# Patient Record
Sex: Male | Born: 1980 | Race: Black or African American | Hispanic: No | Marital: Married | Smoking: Never smoker
Health system: Southern US, Community
[De-identification: ages and names within clinical notes are randomized; demographics above are authoritative.]

## PROBLEM LIST (undated history)

## (undated) DIAGNOSIS — I10 Essential (primary) hypertension: Secondary | ICD-10-CM

## (undated) DIAGNOSIS — E119 Type 2 diabetes mellitus without complications: Secondary | ICD-10-CM

## (undated) DIAGNOSIS — E785 Hyperlipidemia, unspecified: Secondary | ICD-10-CM

## (undated) HISTORY — DX: Essential (primary) hypertension: I10

## (undated) HISTORY — DX: Hyperlipidemia, unspecified: E78.5

## (undated) HISTORY — DX: Type 2 diabetes mellitus without complications: E11.9

---

## 2016-09-29 ENCOUNTER — Emergency Department (HOSPITAL_COMMUNITY)
Admission: EM | Admit: 2016-09-29 | Discharge: 2016-09-30 | Disposition: A | Discharge status: Home / Self Care | Payer: BLUE CROSS/BLUE SHIELD | Attending: Emergency Medicine | Admitting: Emergency Medicine

## 2016-09-29 ENCOUNTER — Encounter (HOSPITAL_COMMUNITY): Payer: Self-pay | Admitting: Emergency Medicine

## 2016-09-29 ENCOUNTER — Emergency Department (HOSPITAL_COMMUNITY): Payer: BLUE CROSS/BLUE SHIELD

## 2016-09-29 DIAGNOSIS — Z79899 Other long term (current) drug therapy: Secondary | ICD-10-CM | POA: Diagnosis not present

## 2016-09-29 DIAGNOSIS — R51 Headache: Secondary | ICD-10-CM | POA: Insufficient documentation

## 2016-09-29 DIAGNOSIS — I10 Essential (primary) hypertension: Secondary | ICD-10-CM | POA: Diagnosis present

## 2016-09-29 LAB — CBC WITH DIFFERENTIAL/PLATELET
BASOS ABS: 0.1 10*3/uL (ref 0.0–0.1)
BASOS PCT: 1 %
EOS ABS: 0.1 10*3/uL (ref 0.0–0.7)
Eosinophils Relative: 2 %
HCT: 45.2 % (ref 39.0–52.0)
HEMOGLOBIN: 15.3 g/dL (ref 13.0–17.0)
Lymphocytes Relative: 33 %
Lymphs Abs: 1.9 10*3/uL (ref 0.7–4.0)
MCH: 27.8 pg (ref 26.0–34.0)
MCHC: 33.8 g/dL (ref 30.0–36.0)
MCV: 82 fL (ref 78.0–100.0)
Monocytes Absolute: 0.7 10*3/uL (ref 0.1–1.0)
Monocytes Relative: 12 %
NEUTROS ABS: 3 10*3/uL (ref 1.7–7.7)
Neutrophils Relative %: 52 %
Platelets: 274 10*3/uL (ref 150–400)
RBC: 5.51 MIL/uL (ref 4.22–5.81)
RDW: 13.5 % (ref 11.5–15.5)
WBC: 5.7 10*3/uL (ref 4.0–10.5)

## 2016-09-29 LAB — BASIC METABOLIC PANEL
ANION GAP: 8 (ref 5–15)
BUN: 13 mg/dL (ref 6–20)
CHLORIDE: 101 mmol/L (ref 101–111)
CO2: 30 mmol/L (ref 22–32)
CREATININE: 1.36 mg/dL — AB (ref 0.61–1.24)
Calcium: 9.5 mg/dL (ref 8.9–10.3)
GFR calc non Af Amer: >60 mL/min (ref >60)
Glucose, Bld: 117 mg/dL — ABNORMAL HIGH (ref 65–99)
POTASSIUM: 3.4 mmol/L — AB (ref 3.5–5.1)
SODIUM: 139 mmol/L (ref 135–145)

## 2016-09-29 MED ORDER — AMLODIPINE BESYLATE 5 MG PO TABS: 5.0000 mg | ORAL_TABLET | Freq: Every day | ORAL | 0 refills | Status: DC

## 2016-09-29 MED ORDER — LABETALOL HCL 5 MG/ML IV SOLN
10.0000 mg | Freq: Once | INTRAVENOUS | Status: AC
  Administered 2016-09-29: 10 mg via INTRAVENOUS
  Filled 2016-09-29: qty 4

## 2016-09-29 MED ORDER — AMLODIPINE BESYLATE 5 MG PO TABS
5.0000 mg | ORAL_TABLET | Freq: Once | ORAL | Status: AC
  Administered 2016-09-29: 5 mg via ORAL
  Filled 2016-09-29: qty 1

## 2016-09-29 NOTE — ED Provider Notes (Signed)
[name]   CSN: [id number] Arrival date & time: 09/29/16  [place]     History   Chief Complaint Chief Complaint  Patient presents with  . Hypertension    HPI Paul Rivers is a 36 y.o. male.  HPI   Patient is a 36 year old male with no known past medical history presents the ED with complaint of elevated blood pressure. Patient reports while he was at his physical for his job he was noted to have blood pressure of 198/102. Patient reports when they rechecked his blood pressure was 204/100. Denies history of blood pressure. Denies taking any medications at home. Patient reports yesterday he had a constant throbbing headache to the back which he states is still constant today but feeling improved. Denies taking any medications for his headache at home. Denies fever, lightheadedness, dizziness, visual changes, cough, shortness of breath, chest pain, palpitations, abdominal pain, nausea, vomiting, diarrhea, urinary symptoms, numbness, tingling, weakness, leg swelling.  History reviewed. No pertinent past medical history.  There are no active problems to display for this patient.   History reviewed. No pertinent surgical history.     Home Medications    Prior to Admission medications   Medication Sig Start Date End Date Taking? Authorizing Provider  amLODipine (NORVASC) 5 MG tablet Take 1 tablet (5 mg total) by mouth daily. 09/29/16   Nona Dell, PA-C    Family History No family history on file.  Social History Social History  Substance Use Topics  . Smoking status: Never Smoker  . Smokeless tobacco: Not on file  . Alcohol use Yes     Allergies   Patient has no known allergies.   Review of Systems Review of Systems  Neurological: Positive for headaches.  All other systems reviewed and are negative.    Physical Exam Updated Vital Signs BP (!) 187/115   Pulse 83   Temp 99 F (37.2 C) (Oral)   Resp 17   Wt 106.1 kg   SpO2  98%   Physical Exam  Constitutional: He is oriented to person, place, and time. He appears well-developed and well-nourished. No distress.  HENT:  Head: Normocephalic and atraumatic.  Mouth/Throat: Oropharynx is clear and moist. No oropharyngeal exudate.  Eyes: Conjunctivae and EOM are normal. Pupils are equal, round, and reactive to light. Right eye exhibits no discharge. Left eye exhibits no discharge. No scleral icterus.  Neck: Normal range of motion. Neck supple.  Cardiovascular: Normal rate, regular rhythm, normal heart sounds and intact distal pulses.   HR 80  Pulmonary/Chest: Effort normal and breath sounds normal. No respiratory distress. He has no wheezes. He has no rales. He exhibits no tenderness.  Abdominal: Soft. Bowel sounds are normal. He exhibits no distension and no mass. There is no tenderness. There is no rebound and no guarding. No hernia.  Musculoskeletal: Normal range of motion. He exhibits no edema.  Neurological: He is alert and oriented to person, place, and time. He has normal strength. No cranial nerve deficit or sensory deficit. Coordination and gait normal.  Skin: Skin is warm and dry. He is not diaphoretic.  Nursing note and vitals reviewed.    ED Treatments / Results  Labs (all labs ordered are listed, but only abnormal results are displayed) Labs Reviewed  BASIC METABOLIC PANEL - Abnormal; Notable for the following:       Result Value   Potassium 3.4 (*)    Glucose, Bld 117 (*)    Creatinine, Ser 1.36 (*)  All other components within normal limits  CBC WITH DIFFERENTIAL/PLATELET    EKG  EKG Interpretation  Date/Time:  Monday September 29 2016 20:37:13 EST Ventricular Rate:  101 PR Interval:    QRS Duration: 87 QT Interval:  346 QTC Calculation: 449 R Axis:   16 Text Interpretation:  Sinus tachycardia Consider right atrial enlargement No prior for comparison No STEMI Confirmed by TEGELER MD, CHRISTOPHER [phone]) on 09/29/2016 10:51:03 PM        Radiology Ct Head Wo Contrast  Result Date: 09/29/2016 CLINICAL DATA:  Elevated blood pressure. Headaches, but not currently. EXAM: CT HEAD WITHOUT CONTRAST TECHNIQUE: Contiguous axial images were obtained from the base of the skull through the vertex without intravenous contrast. COMPARISON:  None. FINDINGS: Brain: No evidence of acute infarction, hemorrhage, hydrocephalus, extra-axial collection or mass lesion/mass effect. Vascular: No hyperdense vessel or unexpected calcification. Skull: No osseous abnormality. Sinuses/Orbits: Visualized paranasal sinuses are clear. Visualized mastoid sinuses are clear. Visualized orbits demonstrate no focal abnormality. Other: None IMPRESSION: No acute intracranial pathology. Electronically Signed   By: Kathreen Devoid   On: 09/29/2016 21:39    Procedures Procedures (including critical care time)  Medications Ordered in ED Medications  amLODipine (NORVASC) tablet 5 mg (not administered)  labetalol (NORMODYNE,TRANDATE) injection 10 mg (10 mg Intravenous Given 09/29/16 2128)     Initial Impression / Assessment and Plan / ED Course  I have reviewed the triage vital signs and the nursing notes.  Pertinent labs & imaging results that were available during my care of the patient were reviewed by me and considered in my medical decision making (see chart for details).     Patient reports with reported elevated blood pressure which was noted at his work physical earlier this afternoon. Reports blood pressure 198/102. Initial BP in the ED 244/138. Patient reports only having a mild throbbing headache, denies any other symptoms. Exam unremarkable. No neuro deficits. Patient given IV labetalol in the ED. EKG showed sinus tachycardia with no acute ischemic changes. Labs revealed creatinine 1.36, no prior results to compare. Remaining labs unremarkable. CT head negative. On reevaluation patient's blood pressure has improved to 187/115. Patient reports significant  improvement of his headache. Plan to give pt oral dose of norvasc in the ED and reevaluate BP.  Hand-off to [facility], PA-C. Plan to reevaluate pt s/p PO norvasc with plan of d/c pt home with rx of 5mg  Norvasc QD and close PCP follow up.  Final Clinical Impressions(s) / ED Diagnoses   Final diagnoses:  Hypertension, unspecified type    New Prescriptions New Prescriptions   AMLODIPINE (NORVASC) 5 MG TABLET    Take 1 tablet (5 mg total) by mouth daily.     Chesley Noon [place], [place] 09/29/16 [place], MD 09/30/16 [phone]

## 2016-09-29 NOTE — ED Provider Notes (Signed)
10:57 PM Patient signed out to me at change of shift.  Pt with elevated blood pressure, given labetolol IV and now to be given PO Norvasc.  Blood pressure is improving.  Labs reveal elevated creatinine.  Plan is for d/c home with PCP follow up on improvement of blood pressure.    1:17 AM Pt feeling well, would like to go home.  BP improved.     Clayton Bibles, PA-C 09/30/16 0118    Gwenyth Allegra Tegeler, MD 09/30/16 [phone]

## 2016-09-29 NOTE — Discharge Instructions (Signed)
Take your blood pressure medication as prescribed. I recommend scheduling appointment with a primary care provider's office listed below within the next week for follow-up evaluation regarding your elevated blood pressure. I also recommend having your blood rechecked as your creatinine was elevated in the ED tonight (Cr 1.36).  Please return to the Emergency Department if symptoms worsen or new onset of fever, headache, lightheadedness, dizziness, visual changes, chest pain, or palpitations, nausea, vomiting, urinary symptoms, numbness, tingling, weakness.

## 2016-09-29 NOTE — ED Notes (Signed)
[facility] charge RN verbalizes need for oral hypertensive medication and to treat blood pressure while waiting. Per Tegeler pt needs next available room with noncontributory medical hx and current blood pressure readings.

## 2016-09-29 NOTE — ED Triage Notes (Signed)
Pt referred here post physical related to BP 198/102; pt verbalizes no hx; only family hx of hypertension; denies symptoms.

## 2016-10-22 ENCOUNTER — Other Ambulatory Visit (INDEPENDENT_AMBULATORY_CARE_PROVIDER_SITE_OTHER): Payer: BLUE CROSS/BLUE SHIELD

## 2016-10-22 ENCOUNTER — Other Ambulatory Visit: Payer: Self-pay | Admitting: Family

## 2016-10-22 ENCOUNTER — Ambulatory Visit (INDEPENDENT_AMBULATORY_CARE_PROVIDER_SITE_OTHER): Payer: BLUE CROSS/BLUE SHIELD | Admitting: Family

## 2016-10-22 ENCOUNTER — Encounter: Payer: Self-pay | Admitting: Family

## 2016-10-22 VITALS — BP 170/100 | HR 80 | Temp 98.2°F | Resp 16 | Ht 65.0 in | Wt 240.0 lb

## 2016-10-22 DIAGNOSIS — I10 Essential (primary) hypertension: Secondary | ICD-10-CM | POA: Insufficient documentation

## 2016-10-22 DIAGNOSIS — E6609 Other obesity due to excess calories: Secondary | ICD-10-CM | POA: Diagnosis not present

## 2016-10-22 DIAGNOSIS — E669 Obesity, unspecified: Secondary | ICD-10-CM | POA: Insufficient documentation

## 2016-10-22 DIAGNOSIS — Z6839 Body mass index (BMI) 39.0-39.9, adult: Secondary | ICD-10-CM

## 2016-10-22 DIAGNOSIS — E1169 Type 2 diabetes mellitus with other specified complication: Secondary | ICD-10-CM | POA: Insufficient documentation

## 2016-10-22 DIAGNOSIS — E119 Type 2 diabetes mellitus without complications: Secondary | ICD-10-CM

## 2016-10-22 DIAGNOSIS — IMO0001 Reserved for inherently not codable concepts without codable children: Secondary | ICD-10-CM | POA: Insufficient documentation

## 2016-10-22 LAB — COMPREHENSIVE METABOLIC PANEL
ALBUMIN: 4.8 g/dL (ref 3.5–5.2)
ALT: 37 U/L (ref 0–53)
AST: 28 U/L (ref 0–37)
Alkaline Phosphatase: 62 U/L (ref 39–117)
BILIRUBIN TOTAL: 0.4 mg/dL (ref 0.2–1.2)
BUN: 17 mg/dL (ref 6–23)
CALCIUM: 9.4 mg/dL (ref 8.4–10.5)
CHLORIDE: 104 meq/L (ref 96–112)
CO2: 27 mEq/L (ref 19–32)
CREATININE: 1.2 mg/dL (ref 0.40–1.50)
GFR: 73.47 mL/min (ref >60.00)
Glucose, Bld: 108 mg/dL — ABNORMAL HIGH (ref 70–99)
Potassium: 3.7 mEq/L (ref 3.5–5.1)
SODIUM: 141 meq/L (ref 135–145)
TOTAL PROTEIN: 7.9 g/dL (ref 6.0–8.3)

## 2016-10-22 LAB — LIPID PANEL
CHOLESTEROL: 161 mg/dL (ref 0–200)
HDL: 37.3 mg/dL — ABNORMAL LOW (ref >39.00)
LDL Cholesterol: 110 mg/dL — ABNORMAL HIGH (ref 0–99)
NONHDL: 123.53
Total CHOL/HDL Ratio: 4
Triglycerides: 68 mg/dL (ref 0.0–149.0)
VLDL: 13.6 mg/dL (ref 0.0–40.0)

## 2016-10-22 LAB — HEMOGLOBIN A1C: HEMOGLOBIN A1C: 6.9 % — AB (ref 4.6–6.5)

## 2016-10-22 MED ORDER — METFORMIN HCL ER 500 MG PO TB24: 500.0000 mg | ORAL_TABLET | Freq: Every day | ORAL | 0 refills | Status: DC

## 2016-10-22 MED ORDER — LISINOPRIL-HYDROCHLOROTHIAZIDE 20-12.5 MG PO TABS: 1.0000 | ORAL_TABLET | Freq: Every day | ORAL | 1 refills | Status: DC

## 2016-10-22 NOTE — Progress Notes (Addendum)
Subjective:    Patient ID: Paul Rivers, male    DOB: 11-Nov-1980, 36 y.o.   MRN: [id number]  Chief Complaint  Patient presents with  . Establish Care    wants to discuss blood pressure, states bp medication makes him feel funny in the morning    HPI:  Paul Rivers is a 36 y.o. male who  has a past medical history of Hypertension. and presents today for an office visit to establish care.    Hypertension - Recently evaulated in the emergency department with the chief complaint of elevated blood pressure. He was noted to have blood pressure readings of 198/102 and repeat of 204/100. Described a constant throbbing headache to the the back of his head. EKG showed sinus tachycardia with right atrial enlargement.  CT scan of the head was normal. He was discharged with 5 mg of amlodipine. z  Reports taking the amlodipine as prescribed and has weird feelings in the morning. Describes it as feeling spaced out at times. Not currently checking his blood pressure at home. Denies worst headache of life with no symptoms of end organ damage. Working on following a low sodium diet and has increased fruit and vegetable intake. Physical activity gradually increasing.   BP Readings from Last 3 Encounters:  10/22/16 (!) 170/100  09/30/16 (!) 162/102    No Known Allergies    Outpatient Medications Prior to Visit  Medication Sig Dispense Refill  . amLODipine (NORVASC) 5 MG tablet Take 1 tablet (5 mg total) by mouth daily. 30 tablet 0   No facility-administered medications prior to visit.      Past Medical History:  Diagnosis Date  . Hypertension       History reviewed. No pertinent surgical history.    Family History  Problem Relation Age of Onset  . Diabetes Mother   . Diabetes Father   . Hypertension Father   . Hypertension Maternal Grandmother       Social History   Social History  . Marital status: Married    Spouse name: N/A  . Number of children: 3  . Years of  education: 12   Occupational History  . Security    Social History Main Topics  . Smoking status: Never Smoker  . Smokeless tobacco: Never Used  . Alcohol use Yes     Comment: Rarely  . Drug use: No  . Sexual activity: Not on file   Other Topics Concern  . Not on file   Social History Narrative   Fun/hobby: Keeping up with children (6, 4, 14)      Review of Systems  Constitutional: Negative for chills and fever.  Eyes:       Negative for changes in vision  Respiratory: Negative for cough, chest tightness and wheezing.   Cardiovascular: Negative for chest pain, palpitations and leg swelling.  Neurological: Negative for dizziness, weakness and light-headedness.       Objective:    BP (!) 170/100   Pulse 80   Temp 98.2 F (36.8 C) (Oral)   Resp 16   Ht '5\' 5"'  (1.651 m)   Wt 240 lb (108.9 kg)   SpO2 97%   BMI 39.94 kg/m  Nursing note and vital signs reviewed.  Physical Exam  Constitutional: He is oriented to person, place, and time. He appears well-developed and well-nourished. No distress.  Neck:  Acanthoses nigrans present.   Cardiovascular: Normal rate, regular rhythm, normal heart sounds and intact distal pulses.  Exam reveals  no gallop and no friction rub.   No murmur heard. Pulmonary/Chest: Effort normal and breath sounds normal. No respiratory distress. He has no wheezes. He has no rales. He exhibits no tenderness.  Neurological: He is alert and oriented to person, place, and time.  Skin: Skin is warm and dry.  Psychiatric: He has a normal mood and affect. His behavior is normal. Judgment and thought content normal.        Assessment & Plan:   Problem List Items Addressed This Visit      Cardiovascular and Mediastinum   Hypertension    Hypertension remains above goal 140/90 with current medication regimen and mild side effect of dizziness which resolves over the course of the day. Try taking medication in the evening. Start  lisinopril-hydrochlorothiazide. Denies worse headache of life with no new symptoms of end organ damage noted on physical exam. Encouraged to follow low sodium diet with information on the DASH eating plan provided. Monitor blood pressure at home. Follow-up in 2 weeks or sooner if needed      Relevant Medications   lisinopril-hydrochlorothiazide (ZESTORETIC) 20-12.5 MG tablet   Other Relevant Orders   Comp Met (CMET)   Hemoglobin A1c   Lipid Profile     Other   Class 2 obesity due to excess calories with serious comorbidity and body mass index (BMI) of 39.0 to 39.9 in adult - Primary    BMI of 39 indicating obesity. Recommend weight loss of 5-10% of current body weight. Recommend increasing physical activity to 30 minutes of moderate level activity daily. Encourage nutritional intake that focuses on nutrient dense foods and is moderate, varied, and balanced and is low in saturated fats and processed/sugary foods. Continue to monitor.          I am having [name] start on lisinopril-hydrochlorothiazide. I am also having him maintain his amLODipine.   Meds ordered this encounter  Medications  . lisinopril-hydrochlorothiazide (ZESTORETIC) 20-12.5 MG tablet    Sig: Take 1 tablet by mouth daily.    Dispense:  30 tablet    Refill:  1    Order Specific Question:   Supervising Provider    Answer:   Pricilla Holm A [[id number]]   A total of 30 minutes were spent face-to-face with the patient during this encounter and over half of that time was spent on counseling and coordination of care.  We discussed in depth the importance of lowering blood pressure to reduce risk of end organ damage in the future. We discussed pharmacological and non-pharmacological approaches including the DASH eating plan. All patient questions were answered.    Follow-up: Return in about 2 weeks (around 11/05/2016).   Mauricio Po, FNP

## 2016-10-22 NOTE — Assessment & Plan Note (Signed)
Hypertension remains above goal 140/90 with current medication regimen and mild side effect of dizziness which resolves over the course of the day. Try taking medication in the evening. Start lisinopril-hydrochlorothiazide. Denies worse headache of life with no new symptoms of end organ damage noted on physical exam. Encouraged to follow low sodium diet with information on the DASH eating plan provided. Monitor blood pressure at home. Follow-up in 2 weeks or sooner if needed

## 2016-10-22 NOTE — Assessment & Plan Note (Signed)
BMI of 39 indicating obesity. Recommend weight loss of 5-10% of current body weight. Recommend increasing physical activity to 30 minutes of moderate level activity daily. Encourage nutritional intake that focuses on nutrient dense foods and is moderate, varied, and balanced and is low in saturated fats and processed/sugary foods. Continue to monitor.

## 2016-10-22 NOTE — Patient Instructions (Addendum)
Thank you for choosing [facility].  SUMMARY AND INSTRUCTIONS:  Please monitor your blood pressure at home and record the numbers to bring at your next office visit.  Follow a low sodium diet.   Increase physical activities gradually.  Medication:  Please continue to take the amlodipine.  Start taking the linsinopril-hydrochlorothiazide.   Your prescription(s) have been submitted to your pharmacy or been printed and provided for you. Please take as directed and contact our office if you believe you are having problem(s) with the medication(s) or have any questions.  Labs:  Please stop by the lab on the lower level of the building for your blood work. Your results will be released to [place] (or called to you) after review, usually within 72 hours after test completion. If any changes need to be made, you will be notified at that same time.  1.) The lab is open from 7:30am to 5:30 pm Monday-Friday 2.) No appointment is necessary 3.) Fasting (if needed) is 6-8 hours after food and drink; black coffee and water are okay   Follow up:  If your symptoms worsen or fail to improve, please contact our office for further instruction, or in case of emergency go directly to the emergency room at the closest medical facility.     Hypertension Hypertension, commonly called high blood pressure, is when the force of blood pumping through the arteries is too strong. The arteries are the blood vessels that carry blood from the heart throughout the body. Hypertension forces the heart to work harder to pump blood and may cause arteries to become narrow or stiff. Having untreated or uncontrolled hypertension can cause heart attacks, strokes, kidney disease, and other problems. A blood pressure reading consists of a higher number over a lower number. Ideally, your blood pressure should be below 120/80. The first ("top") number is called the systolic pressure. It is a measure of the pressure in  your arteries as your heart beats. The second ("bottom") number is called the diastolic pressure. It is a measure of the pressure in your arteries as the heart relaxes. What are the causes? The cause of this condition is not known. What increases the risk? Some risk factors for high blood pressure are under your control. Others are not. Factors you can change   Smoking.  Having type 2 diabetes mellitus, high cholesterol, or both.  Not getting enough exercise or physical activity.  Being overweight.  Having too much fat, sugar, calories, or salt (sodium) in your diet.  Drinking too much alcohol. Factors that are difficult or impossible to change   Having chronic kidney disease.  Having a family history of high blood pressure.  Age. Risk increases with age.  Race. You may be at higher risk if you are African-American.  Gender. Men are at higher risk than women before age 12. After age 47, women are at higher risk than men.  Having obstructive sleep apnea.  Stress. What are the signs or symptoms? Extremely high blood pressure (hypertensive crisis) may cause:  Headache.  Anxiety.  Shortness of breath.  Nosebleed.  Nausea and vomiting.  Severe chest pain.  Jerky movements you cannot control (seizures). How is this diagnosed? This condition is diagnosed by measuring your blood pressure while you are seated, with your arm resting on a surface. The cuff of the blood pressure monitor will be placed directly against the skin of your upper arm at the level of your heart. It should be measured at least twice using  the same arm. Certain conditions can cause a difference in blood pressure between your right and left arms. Certain factors can cause blood pressure readings to be lower or higher than normal (elevated) for a short period of time:  When your blood pressure is higher when you are in a health care provider's office than when you are at home, this is called white coat  hypertension. Most people with this condition do not need medicines.  When your blood pressure is higher at home than when you are in a health care provider's office, this is called masked hypertension. Most people with this condition may need medicines to control blood pressure. If you have a high blood pressure reading during one visit or you have normal blood pressure with other risk factors:  You may be asked to return on a different day to have your blood pressure checked again.  You may be asked to monitor your blood pressure at home for 1 week or longer. If you are diagnosed with hypertension, you may have other blood or imaging tests to help your health care provider understand your overall risk for other conditions. How is this treated? This condition is treated by making healthy lifestyle changes, such as eating healthy foods, exercising more, and reducing your alcohol intake. Your health care provider may prescribe medicine if lifestyle changes are not enough to get your blood pressure under control, and if:  Your systolic blood pressure is above 130.  Your diastolic blood pressure is above 80. Your personal target blood pressure may vary depending on your medical conditions, your age, and other factors. Follow these instructions at home: Eating and drinking   Eat a diet that is high in fiber and potassium, and low in sodium, added sugar, and fat. An example eating plan is called the DASH (Dietary Approaches to Stop Hypertension) diet. To eat this way:  Eat plenty of fresh fruits and vegetables. Try to fill half of your plate at each meal with fruits and vegetables.  Eat whole grains, such as whole wheat pasta, brown rice, or whole grain bread. Fill about one quarter of your plate with whole grains.  Eat or drink low-fat dairy products, such as skim milk or low-fat yogurt.  Avoid fatty cuts of meat, processed or cured meats, and poultry with skin. Fill about one quarter of your  plate with lean proteins, such as fish, chicken without skin, beans, eggs, and tofu.  Avoid premade and processed foods. These tend to be higher in sodium, added sugar, and fat.  Reduce your daily sodium intake. Most people with hypertension should eat less than 1,500 mg of sodium a day.  Limit alcohol intake to no more than 1 drink a day for nonpregnant women and 2 drinks a day for men. One drink equals 12 oz of beer, 5 oz of wine, or 1 oz of hard liquor. Lifestyle   Work with your health care provider to maintain a healthy body weight or to lose weight. Ask what an ideal weight is for you.  Get at least 30 minutes of exercise that causes your heart to beat faster (aerobic exercise) most days of the week. Activities may include walking, swimming, or biking.  Include exercise to strengthen your muscles (resistance exercise), such as pilates or lifting weights, as part of your weekly exercise routine. Try to do these types of exercises for 30 minutes at least 3 days a week.  Do not use any products that contain nicotine or tobacco, such  as cigarettes and e-cigarettes. If you need help quitting, ask your health care provider.  Monitor your blood pressure at home as told by your health care provider.  Keep all follow-up visits as told by your health care provider. This is important. Medicines   Take over-the-counter and prescription medicines only as told by your health care provider. Follow directions carefully. Blood pressure medicines must be taken as prescribed.  Do not skip doses of blood pressure medicine. Doing this puts you at risk for problems and can make the medicine less effective.  Ask your health care provider about side effects or reactions to medicines that you should watch for. Contact a health care provider if:  You think you are having a reaction to a medicine you are taking.  You have headaches that keep coming back (recurring).  You feel dizzy.  You have swelling  in your ankles.  You have trouble with your vision. Get help right away if:  You develop a severe headache or confusion.  You have unusual weakness or numbness.  You feel faint.  You have severe pain in your chest or abdomen.  You vomit repeatedly.  You have trouble breathing. Summary  Hypertension is when the force of blood pumping through your arteries is too strong. If this condition is not controlled, it may put you at risk for serious complications.  Your personal target blood pressure may vary depending on your medical conditions, your age, and other factors. For most people, a normal blood pressure is less than 120/80.  Hypertension is treated with lifestyle changes, medicines, or a combination of both. Lifestyle changes include weight loss, eating a healthy, low-sodium diet, exercising more, and limiting alcohol. This information is not intended to replace advice given to you by your health care provider. Make sure you discuss any questions you have with your health care provider. Document Released: 08/11/2005 Document Revised: 07/09/2016 Document Reviewed: 07/09/2016 Elsevier Interactive Patient Education  2017 [place] DASH stands for "Dietary Approaches to Stop Hypertension." The DASH eating plan is a healthy eating plan that has been shown to reduce high blood pressure (hypertension). It may also reduce your risk for type 2 diabetes, heart disease, and stroke. The DASH eating plan may also help with weight loss. What are tips for following this plan? General guidelines   Avoid eating more than 2,300 mg (milligrams) of salt (sodium) a day. If you have hypertension, you may need to reduce your sodium intake to 1,500 mg a day.  Limit alcohol intake to no more than 1 drink a day for nonpregnant women and 2 drinks a day for men. One drink equals 12 oz of beer, 5 oz of wine, or 1 oz of hard liquor.  Work with your health care provider to maintain a  healthy body weight or to lose weight. Ask what an ideal weight is for you.  Get at least 30 minutes of exercise that causes your heart to beat faster (aerobic exercise) most days of the week. Activities may include walking, swimming, or biking.  Work with your health care provider or diet and nutrition specialist (dietitian) to adjust your eating plan to your individual calorie needs. Reading food labels   Check food labels for the amount of sodium per serving. Choose foods with less than 5 percent of the Daily Value of sodium. Generally, foods with less than 300 mg of sodium per serving fit into this eating plan.  To find whole grains, look  for the word "whole" as the first word in the ingredient list. Shopping   Buy products labeled as "low-sodium" or "no salt added."  Buy fresh foods. Avoid canned foods and premade or frozen meals. Cooking   Avoid adding salt when cooking. Use salt-free seasonings or herbs instead of table salt or sea salt. Check with your health care provider or pharmacist before using salt substitutes.  Do not fry foods. Cook foods using healthy methods such as baking, boiling, grilling, and broiling instead.  Cook with heart-healthy oils, such as olive, canola, soybean, or sunflower oil. Meal planning    Eat a balanced diet that includes:  5 or more servings of fruits and vegetables each day. At each meal, try to fill half of your plate with fruits and vegetables.  Up to 6-8 servings of whole grains each day.  Less than 6 oz of lean meat, poultry, or fish each day. A 3-oz serving of meat is about the same size as a deck of cards. One egg equals 1 oz.  2 servings of low-fat dairy each day.  A serving of nuts, seeds, or beans 5 times each week.  Heart-healthy fats. Healthy fats called Omega-3 fatty acids are found in foods such as flaxseeds and coldwater fish, like sardines, salmon, and mackerel.  Limit how much you eat of the following:  Canned or  prepackaged foods.  Food that is high in trans fat, such as fried foods.  Food that is high in saturated fat, such as fatty meat.  Sweets, desserts, sugary drinks, and other foods with added sugar.  Full-fat dairy products.  Do not salt foods before eating.  Try to eat at least 2 vegetarian meals each week.  Eat more home-cooked food and less restaurant, buffet, and fast food.  When eating at a restaurant, ask that your food be prepared with less salt or no salt, if possible. What foods are recommended? The items listed may not be a complete list. Talk with your dietitian about what dietary choices are best for you. Grains  Whole-grain or whole-wheat bread. Whole-grain or whole-wheat pasta. Brown rice. [place]. Bulgur. Whole-grain and low-sodium cereals. Pita bread. Low-fat, low-sodium crackers. Whole-wheat flour tortillas. Vegetables  Fresh or frozen vegetables (raw, steamed, roasted, or grilled). Low-sodium or reduced-sodium tomato and vegetable juice. Low-sodium or reduced-sodium tomato sauce and tomato paste. Low-sodium or reduced-sodium canned vegetables. Fruits  All fresh, dried, or frozen fruit. Canned fruit in natural juice (without added sugar). Meat and other protein foods  Skinless chicken or [place]. Ground chicken or [place]. Pork with fat trimmed off. Fish and seafood. Egg whites. Dried beans, peas, or lentils. Unsalted nuts, nut butters, and seeds. Unsalted canned beans. Lean cuts of beef with fat trimmed off. Low-sodium, lean deli meat. Dairy  Low-fat (1%) or fat-free (skim) milk. Fat-free, low-fat, or reduced-fat cheeses. Nonfat, low-sodium ricotta or cottage cheese. Low-fat or nonfat yogurt. Low-fat, low-sodium cheese. Fats and oils  Soft margarine without trans fats. Vegetable oil. Low-fat, reduced-fat, or light mayonnaise and salad dressings (reduced-sodium). Canola, safflower, olive, soybean, and sunflower oils. Avocado. Seasoning and other foods  Herbs.  Spices. Seasoning mixes without salt. Unsalted popcorn and pretzels. Fat-free sweets. What foods are not recommended? The items listed may not be a complete list. Talk with your dietitian about what dietary choices are best for you. Grains  Baked goods made with fat, such as croissants, muffins, or some breads. Dry pasta or rice meal packs. Vegetables  Creamed or fried vegetables. Vegetables  in a cheese sauce. Regular canned vegetables (not low-sodium or reduced-sodium). Regular canned tomato sauce and paste (not low-sodium or reduced-sodium). Regular tomato and vegetable juice (not low-sodium or reduced-sodium). Angie Fava. Olives. Fruits  Canned fruit in a light or heavy syrup. Fried fruit. Fruit in cream or butter sauce. Meat and other protein foods  Fatty cuts of meat. Ribs. Fried meat. Berniece Salines. Sausage. Bologna and other processed lunch meats. Salami. Fatback. Hotdogs. Bratwurst. Salted nuts and seeds. Canned beans with added salt. Canned or smoked fish. Whole eggs or egg yolks. Chicken or [place] with skin. Dairy  Whole or 2% milk, cream, and half-and-half. Whole or full-fat cream cheese. Whole-fat or sweetened yogurt. Full-fat cheese. Nondairy creamers. Whipped toppings. Processed cheese and cheese spreads. Fats and oils  Butter. Stick margarine. Lard. Shortening. Ghee. Bacon fat. Tropical oils, such as coconut, palm kernel, or palm oil. Seasoning and other foods  Salted popcorn and pretzels. Onion salt, garlic salt, seasoned salt, table salt, and sea salt. Worcestershire sauce. Tartar sauce. Barbecue sauce. Teriyaki sauce. Soy sauce, including reduced-sodium. Steak sauce. Canned and packaged gravies. Fish sauce. Oyster sauce. Cocktail sauce. Horseradish that you find on the shelf. Ketchup. Mustard. Meat flavorings and tenderizers. Bouillon cubes. Hot sauce and Tabasco sauce. Premade or packaged marinades. Premade or packaged taco seasonings. Relishes. Regular salad dressings. Where to find more  information:  National Heart, Lung, and [place]: https://wilson-eaton.com/  American Heart Association: www.heart.org Summary  The DASH eating plan is a healthy eating plan that has been shown to reduce high blood pressure (hypertension). It may also reduce your risk for type 2 diabetes, heart disease, and stroke.  With the DASH eating plan, you should limit salt (sodium) intake to 2,300 mg a day. If you have hypertension, you may need to reduce your sodium intake to 1,500 mg a day.  When on the DASH eating plan, aim to eat more fresh fruits and vegetables, whole grains, lean proteins, low-fat dairy, and heart-healthy fats.  Work with your health care provider or diet and nutrition specialist (dietitian) to adjust your eating plan to your individual calorie needs. This information is not intended to replace advice given to you by your health care provider. Make sure you discuss any questions you have with your health care provider. Document Released: 07/31/2011 Document Revised: 08/04/2016 Document Reviewed: 08/04/2016 Elsevier Interactive Patient Education  2017 [employer].

## 2016-11-06 ENCOUNTER — Ambulatory Visit: Payer: BLUE CROSS/BLUE SHIELD | Admitting: Family

## 2016-11-18 ENCOUNTER — Ambulatory Visit: Payer: BLUE CROSS/BLUE SHIELD | Admitting: Family

## 2019-02-22 ENCOUNTER — Ambulatory Visit (INDEPENDENT_AMBULATORY_CARE_PROVIDER_SITE_OTHER): Payer: BLUE CROSS/BLUE SHIELD | Admitting: Family Medicine

## 2019-02-22 ENCOUNTER — Other Ambulatory Visit: Payer: Self-pay

## 2019-02-22 ENCOUNTER — Encounter: Payer: Self-pay | Admitting: Family Medicine

## 2019-02-22 DIAGNOSIS — E1169 Type 2 diabetes mellitus with other specified complication: Secondary | ICD-10-CM | POA: Insufficient documentation

## 2019-02-22 DIAGNOSIS — I10 Essential (primary) hypertension: Secondary | ICD-10-CM

## 2019-02-22 DIAGNOSIS — E785 Hyperlipidemia, unspecified: Secondary | ICD-10-CM

## 2019-02-22 DIAGNOSIS — G4730 Sleep apnea, unspecified: Secondary | ICD-10-CM

## 2019-02-22 MED ORDER — BLOOD PRESSURE MONITOR AUTOMAT DEVI: 1.0000 | Freq: Every day | 0 refills | Status: DC

## 2019-02-22 MED ORDER — ACCU-CHEK AVIVA CONNECT W/DEVICE KIT: 1.0000 | PACK | Freq: Every day | 0 refills | Status: DC

## 2019-02-22 MED ORDER — AMLODIPINE BESYLATE 5 MG PO TABS: 5.0000 mg | ORAL_TABLET | Freq: Every day | ORAL | 1 refills | Status: DC

## 2019-02-22 NOTE — Assessment & Plan Note (Signed)
Educated about Dx,prognosis,treatment options,and complications of elevated glucose.  Regular exercise and healthy diet with avoidance of added sugar food intake is an important part of treatment and recommended. Annual eye exam, periodic dental and foot care recommended. Lab appt will be arranged.  F/U in 4 months

## 2019-02-22 NOTE — Progress Notes (Signed)
Virtual Visit via Video Note   I connected with Paul Rivers on 02/22/19 at  4:00 PM EDT by a video enabled telemedicine application and verified that I am speaking with the correct person using two identifiers.  Location patient: home Location provider:home office Persons participating in the virtual visit: patient, provider  I discussed the limitations of evaluation and management by telemedicine and the availability of in person appointments. The patient expressed understanding and agreed to proceed.   HPI:  Paul Rivers is a 38 yo AA male who is establishing care today. Former PCP: Mauricio Po, PA Last CPE > 2 years ago.  Hx of HTN,DM II,and HLD among some. Last follow up visit 09/2016,when he was diagnosed with DM II. Metformin was recommended,he did not take medication. He is not checking BS's. Denies abdominal pain, nausea,vomiting, polydipsia,polyuria, or polyphagia.  He has not had an eye exam in a few years.  Denies feet ulcers, numbness,tingling,or burning.   Lab Results  Component Value Date   HGBA1C 6.9 (H) 10/22/2016   HTN: DX in 09/2016,ED visit because BP was elevated at 204/100 and 198/102.  He is not checking BP's at home. He was on Amlodipine 5 mg daily and Lisinopril-HCTZ   Denies severe/frequent headache, visual changes, chest pain, dyspnea, palpitation, claudication, focal weakness, or edema.  Lab Results  Component Value Date   CREATININE 1.20 10/22/2016   BUN 17 10/22/2016   NA 141 10/22/2016   K 3.7 10/22/2016   CL 104 10/22/2016   CO2 27 10/22/2016   HLD,he is not on pharmacologic treatment. He is trying to do better with low fat diet.  Lab Results  Component Value Date   CHOL 161 10/22/2016   HDL 37.30 (L) 10/22/2016   LDLCALC 110 (H) 10/22/2016   TRIG 68.0 10/22/2016   CHOLHDL 4 10/22/2016   He does not exercise regularly but walks "a lot" when at work. He is eating healthier hoping that BP would be better controlled.  When asked  about episodes of apnea he states that his wife has mentioned this to him. Stops breathing a couple times per night. Denies fatigue or morning headache.  ROS: See pertinent positives and negatives per HPI.  Past Medical History:  Diagnosis Date  . Diabetes mellitus without complication ([place])   . Hyperlipidemia   . Hypertension     History reviewed. No pertinent surgical history.  Family History  Problem Relation Age of Onset  . Diabetes Mother   . Diabetes Father   . Hypertension Father   . Hypertension Maternal Grandmother     Social History   Socioeconomic History  . Marital status: Married    Spouse name: Not on file  . Number of children: 3  . Years of education: 18  . Highest education level: Not on file  Occupational History  . Occupation: Security  Social Needs  . Financial resource strain: Not on file  . Food insecurity    Worry: Not on file    Inability: Not on file  . Transportation needs    Medical: Not on file    Non-medical: Not on file  Tobacco Use  . Smoking status: Never Smoker  . Smokeless tobacco: Never Used  Substance and Sexual Activity  . Alcohol use: Yes    Comment: Rarely  . Drug use: No  . Sexual activity: Not on file  Lifestyle  . Physical activity    Days per week: Not on file    Minutes per session: Not  on file  . Stress: Not on file  Relationships  . Social Herbalist on phone: Not on file    Gets together: Not on file    Attends religious service: Not on file    Active member of club or organization: Not on file    Attends meetings of clubs or organizations: Not on file    Relationship status: Not on file  . Intimate partner violence    Fear of current or ex partner: Not on file    Emotionally abused: Not on file    Physically abused: Not on file    Forced sexual activity: Not on file  Other Topics Concern  . Not on file  Social History Narrative   Fun/hobby: Keeping up with children (6, 4, 14)       Current Outpatient Medications:  None.  EXAM:  VITALS per patient if applicable:N/A  GENERAL: alert, oriented, appears well and in no acute distress  HEENT: atraumatic, conjunctiva clear, no obvious facial abnormalities on inspection.  NECK: normal movements of the head and neck  LUNGS: on inspection no signs of respiratory distress, breathing rate appears normal, no obvious gross SOB, gasping or wheezing  CV: no obvious cyanosis  MS: moves all visible extremities without noticeable abnormality  PSYCH/NEURO: pleasant and cooperative, no obvious depression or anxiety, speech and thought processing grossly intact  ASSESSMENT AND PLAN:  Discussed the following assessment and plan:  Orders Placed This Encounter  Procedures  . Comprehensive metabolic panel  . Fructosamine  . Hemoglobin A1c  . Microalbumin / creatinine urine ratio  . TSH  . Lipid panel  . Ambulatory referral to Pulmonology     Sleep apnea, unspecified type - Plan: Ambulatory referral to Pulmonology Complications of OSA discussed. Pulmonology appt will be arranged to discussed sleep study. Wt loss strongly recommended.   Type 2 diabetes mellitus with other specified complication [facility]) Educated about Dx,prognosis,treatment options,and complications of elevated glucose.  Regular exercise and healthy diet with avoidance of added sugar food intake is an important part of treatment and recommended. Annual eye exam, periodic dental and foot care recommended. Lab appt will be arranged.  F/U in 4 months   Hyperlipidemia associated with type 2 diabetes mellitus ([place]) Benefits of statin medications discussed. Low fat diet recommended. He prefers to hold on medication until labs are done.  Essential hypertension, benign Not well controlled. Possible complications of elevated BP discussed. Recommend Amlodipine 5 mg daily. Monday will plan on BP checking in the clinic. Clearly instructed about  warning signs. F/U in 3-4 months.    I discussed the assessment and treatment plan with the patient. He was provided an opportunity to ask questions and all were answered. The patient agreed with the plan and demonstrated an understanding of the instructions.     Return in about 4 months (around 06/24/2019) for Lab and BP check Monday..     [place], MD

## 2019-02-22 NOTE — Assessment & Plan Note (Signed)
Not well controlled. Possible complications of elevated BP discussed. Recommend Amlodipine 5 mg daily. Monday will plan on BP checking in the clinic. Clearly instructed about warning signs. F/U in 3-4 months.

## 2019-02-22 NOTE — Assessment & Plan Note (Signed)
Benefits of statin medications discussed. Low fat diet recommended. He prefers to hold on medication until labs are done.

## 2019-02-28 ENCOUNTER — Other Ambulatory Visit: Payer: Self-pay

## 2019-02-28 ENCOUNTER — Other Ambulatory Visit (INDEPENDENT_AMBULATORY_CARE_PROVIDER_SITE_OTHER): Payer: Self-pay

## 2019-02-28 DIAGNOSIS — E1169 Type 2 diabetes mellitus with other specified complication: Secondary | ICD-10-CM

## 2019-02-28 DIAGNOSIS — E785 Hyperlipidemia, unspecified: Secondary | ICD-10-CM

## 2019-02-28 LAB — COMPREHENSIVE METABOLIC PANEL
ALT: 25 U/L (ref 0–53)
AST: 24 U/L (ref 0–37)
Albumin: 4.8 g/dL (ref 3.5–5.2)
Alkaline Phosphatase: 71 U/L (ref 39–117)
BUN: 22 mg/dL (ref 6–23)
CO2: 29 mEq/L (ref 19–32)
Calcium: 9 mg/dL (ref 8.4–10.5)
Chloride: 97 mEq/L (ref 96–112)
Creatinine, Ser: 1.95 mg/dL — ABNORMAL HIGH (ref 0.40–1.50)
GFR: 38.95 mL/min — ABNORMAL LOW (ref >60.00)
Glucose, Bld: 107 mg/dL — ABNORMAL HIGH (ref 70–99)
Potassium: 3.3 mEq/L — ABNORMAL LOW (ref 3.5–5.1)
Sodium: 138 mEq/L (ref 135–145)
Total Bilirubin: 0.5 mg/dL (ref 0.2–1.2)
Total Protein: 7.6 g/dL (ref 6.0–8.3)

## 2019-02-28 LAB — LIPID PANEL
Cholesterol: 204 mg/dL — ABNORMAL HIGH (ref 0–200)
HDL: 41.5 mg/dL (ref >39.00)
LDL Cholesterol: 143 mg/dL — ABNORMAL HIGH (ref 0–99)
NonHDL: 162
Total CHOL/HDL Ratio: 5
Triglycerides: 94 mg/dL (ref 0.0–149.0)
VLDL: 18.8 mg/dL (ref 0.0–40.0)

## 2019-02-28 LAB — HEMOGLOBIN A1C: Hgb A1c MFr Bld: 6.4 % (ref 4.6–6.5)

## 2019-02-28 LAB — MICROALBUMIN / CREATININE URINE RATIO
Creatinine,U: 218.4 mg/dL
Microalb Creat Ratio: 33.3 mg/g — ABNORMAL HIGH (ref 0.0–30.0)
Microalb, Ur: 72.7 mg/dL — ABNORMAL HIGH (ref 0.0–1.9)

## 2019-02-28 LAB — TSH: TSH: 4.78 u[IU]/mL — ABNORMAL HIGH (ref 0.35–4.50)

## 2019-03-02 LAB — FRUCTOSAMINE: Fructosamine: 270 umol/L (ref 205–285)

## 2019-03-09 ENCOUNTER — Other Ambulatory Visit: Payer: Self-pay | Admitting: *Deleted

## 2019-03-09 MED ORDER — ACCU-CHEK SOFT TOUCH LANCETS MISC: 3 refills | Status: DC

## 2019-03-09 MED ORDER — ACCU-CHEK GUIDE W/DEVICE KIT: 1.0000 | PACK | Freq: Every day | 0 refills | Status: DC

## 2019-03-09 MED ORDER — ACCU-CHEK GUIDE VI STRP: ORAL_STRIP | 3 refills | Status: DC

## 2019-05-18 ENCOUNTER — Ambulatory Visit (INDEPENDENT_AMBULATORY_CARE_PROVIDER_SITE_OTHER): Payer: Self-pay | Admitting: Family Medicine

## 2019-05-18 ENCOUNTER — Encounter: Payer: Self-pay | Admitting: Family Medicine

## 2019-05-18 ENCOUNTER — Other Ambulatory Visit: Payer: Self-pay

## 2019-05-18 VITALS — BP 208/134 | HR 105 | Temp 98.3°F | Ht 63.0 in | Wt 209.7 lb

## 2019-05-18 DIAGNOSIS — R1013 Epigastric pain: Secondary | ICD-10-CM

## 2019-05-18 DIAGNOSIS — I10 Essential (primary) hypertension: Secondary | ICD-10-CM

## 2019-05-18 DIAGNOSIS — R361 Hematospermia: Secondary | ICD-10-CM

## 2019-05-18 MED ORDER — AMLODIPINE BESYLATE 5 MG PO TABS: 5.0000 mg | ORAL_TABLET | Freq: Every day | ORAL | 1 refills | Status: DC

## 2019-05-18 MED ORDER — PANTOPRAZOLE SODIUM 40 MG PO TBEC: 40.0000 mg | DELAYED_RELEASE_TABLET | Freq: Every day | ORAL | 3 refills | Status: DC

## 2019-05-18 NOTE — Patient Instructions (Signed)
Blood in semen (hematospermia) is usually benign  Start the Amlodipine and be sure to get follow up with Dr Martinique in 3 weeks.   Avoid all NSAIDS (Advil, Aleve, Motrin)  Watch sodium intake  Start the Protonix and let Dr Martinique know in 2 weeks if no better.

## 2019-05-19 NOTE — Progress Notes (Signed)
Subjective:     Patient ID: Paul Rivers, male   DOB: 1981-03-20, 38 y.o.   MRN: [id number]  HPI Patient is seen today with three separate concerns:  He has had some intermittent hematospermia over past 2 weeks.  No hx of similar symptoms.  No hematuria.  No fever.  No erectile issues.  No dysuria.  Hypertension.  Is supposed to be on Amlodipine but ran out and has not refilled.  He also reports some "lightheaded" symptoms in past after first starting the medication and his job requires climbing ladders and he was reluctant to take.  No headaches.  No chest pain.  He does have fatigue.  He had elevated creatinine of 1.95 by recent labs and also proteinuria. No regular ETOH use.  No NSAIDS.  Tries to avoid sodium.   Strong FH of hypertension.    Third issue is several week hx of epigastric burning/achy pain after eating.  No radiation.  No nausea or vomiting.   No melena.  He thinks he might have lost a few pounds.  Appetite is slightly down.  No dysphagia.  As above, no ETOH and no NSAIDS. No hx of PUD.   No stool changes.  Past Medical History:  Diagnosis Date  . Diabetes mellitus without complication ([place])   . Hyperlipidemia   . Hypertension    History reviewed. No pertinent surgical history.  reports that he has never smoked. He has never used smokeless tobacco. He reports current alcohol use. He reports that he does not use drugs. family history includes Diabetes in his father and mother; Hypertension in his father and maternal grandmother. No Known Allergies  Wt Readings from Last 3 Encounters:  05/18/19 209 lb 11.2 oz (95.1 kg)  10/22/16 240 lb (108.9 kg)  09/29/16 234 lb (106.1 kg)    Review of Systems  Constitutional: Positive for appetite change and fatigue. Negative for chills and fever.  HENT: Negative for trouble swallowing.   Eyes: Negative for visual disturbance.  Respiratory: Negative for cough, chest tightness and shortness of breath.   Cardiovascular: Negative  for chest pain, palpitations and leg swelling.  Gastrointestinal: Positive for abdominal pain. Negative for abdominal distention, blood in stool, constipation, diarrhea, nausea and vomiting.  Genitourinary: Negative for discharge, dysuria, genital sores, hematuria, penile pain and testicular pain.  Neurological: Negative for dizziness, syncope, weakness, light-headedness and headaches.  Hematological: Negative for adenopathy. Does not bruise/bleed easily.       Objective:   Physical Exam Vitals signs reviewed.  Constitutional:      Appearance: He is well-developed.  Neck:     Musculoskeletal: Neck supple.  Cardiovascular:     Rate and Rhythm: Normal rate and regular rhythm.  Pulmonary:     Breath sounds: Normal breath sounds. No wheezing or rales.  Abdominal:     General: Bowel sounds are normal.     Palpations: Abdomen is soft. There is no hepatomegaly, splenomegaly or mass.     Tenderness: There is no abdominal tenderness. There is no guarding or rebound.  Musculoskeletal:     Right lower leg: No edema.     Left lower leg: No edema.  Lymphadenopathy:     Cervical: No cervical adenopathy.  Neurological:     General: No focal deficit present.     Mental Status: He is alert.        Assessment:     #1 Hematospermia- we explained that this is usually benign and self limited  #2 Severe hypertension-  currently not treated.  We had long discussion about our concern for his severe BP elevation and both short and long term complication risks.  Already appears to have some renal involvement.  Needs very close follow up with primary  #3 epigastric abdominal pain. ?gastritis vs PUD vs other.  No known specific risk factors for gastritis.    Plan:     -follow up with Dr Martinique if hematospermia persists  -Avoid NSAIDS and ETOH.  Start Protonix 40 mg po qd.  If not getting prompt relief with that will need further GI evaluation with either EGD or UGI  - long talk as above regarding  BP.  Start back Amlodipine 5 mg daily.  Explained that ANY BP medication will likely cause some mild dizziness with first use.  Needs office follow up with Dr Martinique in 2-3 weeks to reassess.  He knows seriousness of getting this under better control   Eulas Post MD [facility]

## 2019-06-01 ENCOUNTER — Ambulatory Visit (INDEPENDENT_AMBULATORY_CARE_PROVIDER_SITE_OTHER): Payer: Commercial Managed Care - PPO | Admitting: Family Medicine

## 2019-06-01 ENCOUNTER — Encounter: Payer: Self-pay | Admitting: Family Medicine

## 2019-06-01 ENCOUNTER — Other Ambulatory Visit: Payer: Self-pay

## 2019-06-01 VITALS — BP 200/140 | HR 100 | Temp 98.0°F | Resp 12 | Ht 63.0 in | Wt 206.1 lb

## 2019-06-01 DIAGNOSIS — E876 Hypokalemia: Secondary | ICD-10-CM

## 2019-06-01 DIAGNOSIS — R9431 Abnormal electrocardiogram [ECG] [EKG]: Secondary | ICD-10-CM | POA: Diagnosis not present

## 2019-06-01 DIAGNOSIS — I10 Essential (primary) hypertension: Secondary | ICD-10-CM | POA: Diagnosis not present

## 2019-06-01 DIAGNOSIS — R1013 Epigastric pain: Secondary | ICD-10-CM

## 2019-06-01 DIAGNOSIS — R944 Abnormal results of kidney function studies: Secondary | ICD-10-CM

## 2019-06-01 LAB — BASIC METABOLIC PANEL
BUN: 26 mg/dL — ABNORMAL HIGH (ref 6–23)
CO2: 32 mEq/L (ref 19–32)
Calcium: 9.4 mg/dL (ref 8.4–10.5)
Chloride: 96 mEq/L (ref 96–112)
Creatinine, Ser: 2.39 mg/dL — ABNORMAL HIGH (ref 0.40–1.50)
GFR: 30.75 mL/min — ABNORMAL LOW (ref >60.00)
Glucose, Bld: 94 mg/dL (ref 70–99)
Potassium: 3.1 mEq/L — ABNORMAL LOW (ref 3.5–5.1)
Sodium: 139 mEq/L (ref 135–145)

## 2019-06-01 LAB — CBC WITH DIFFERENTIAL/PLATELET
Basophils Absolute: 0.1 10*3/uL (ref 0.0–0.1)
Basophils Relative: 1.3 % (ref 0.0–3.0)
Eosinophils Absolute: 0.1 10*3/uL (ref 0.0–0.7)
Eosinophils Relative: 1.2 % (ref 0.0–5.0)
HCT: 41.2 % (ref 39.0–52.0)
Hemoglobin: 13.7 g/dL (ref 13.0–17.0)
Lymphocytes Relative: 22 % (ref 12.0–46.0)
Lymphs Abs: 1.2 10*3/uL (ref 0.7–4.0)
MCHC: 33.3 g/dL (ref 30.0–36.0)
MCV: 81.9 fl (ref 78.0–100.0)
Monocytes Absolute: 0.4 10*3/uL (ref 0.1–1.0)
Monocytes Relative: 8.2 % (ref 3.0–12.0)
Neutro Abs: 3.6 10*3/uL (ref 1.4–7.7)
Neutrophils Relative %: 67.3 % (ref 43.0–77.0)
Platelets: 304 10*3/uL (ref 150.0–400.0)
RBC: 5.03 Mil/uL (ref 4.22–5.81)
RDW: 14.4 % (ref 11.5–15.5)
WBC: 5.4 10*3/uL (ref 4.0–10.5)

## 2019-06-01 LAB — TSH: TSH: 1.58 u[IU]/mL (ref 0.35–4.50)

## 2019-06-01 MED ORDER — POTASSIUM CHLORIDE CRYS ER 20 MEQ PO TBCR: 20.0000 meq | EXTENDED_RELEASE_TABLET | Freq: Every day | ORAL | 3 refills | Status: DC

## 2019-06-01 MED ORDER — BENAZEPRIL HCL 10 MG PO TABS: 10.0000 mg | ORAL_TABLET | Freq: Every day | ORAL | 2 refills | Status: DC

## 2019-06-01 MED ORDER — AMLODIPINE BESYLATE 10 MG PO TABS: 10.0000 mg | ORAL_TABLET | Freq: Every day | ORAL | 0 refills | Status: DC

## 2019-06-01 NOTE — Patient Instructions (Addendum)
A few things to remember from today's visit:   Severe hypertension - Plan: EKG [id number], Basic metabolic panel, CBC with Differential/Platelet, TSH, Aldosterone + renin activity w/ ratio, VAS US RENAL ARTERY DUPLEX, Ambulatory referral to Cardiology, amLODipine (NORVASC) 10 MG tablet, benazepril (LOTENSIN) 10 MG tablet  Abnormal EKG - Plan: Ambulatory referral to Cardiology  Today amlodipine was increased to 5 mg. Benazepril added at 10 mg daily. Low-salt diet. You must check blood pressure daily. Appointment with cardiology has been arranged. If any headache, chest pain, dyspnea, palpitation, diaphoresis, dizziness, or mental status change you must go to the ER, call 911.  Follow-up in 1-2 week.  Please be sure medication list is accurate. If a new problem present, please set up appointment sooner than planned today.

## 2019-06-01 NOTE — Progress Notes (Signed)
HPI:   [name] is a 38 y.o. male with Hx of DM II,HLD,and HTN among some who is here today to follow on recent OV  He was last seen on 02/22/2019. He was seen recently due to elevated BP and epigastric abdominal pain, 05/18/2019. BP was 208/134. He was not taking his Amlodipine 5 mg as instructed. States that he has reduced salt intake.  He has not been checking BP at home.  He has long Hx of poorly controlled HTN.  He denies taking OTC cough medication, NSAID's,weight loss supplements, or illicit drug use.  Last renal function abnormal and hypoK+   Lab Results  Component Value Date   CREATININE 1.95 (H) 02/28/2019   BUN 22 02/28/2019   NA 138 02/28/2019   K 3.3 (L) 02/28/2019   CL 97 02/28/2019   CO2 29 02/28/2019   Denies severe/frequent headache, visual changes, chest pain, dyspnea, palpitation, claudication, focal weakness, or edema.  Negative for gross hematuria, foam in urine, or decreased urine output.  Epigastric burning sensation has improved, still has mild episodes when he skips meals. He denies associated fever, chills, nausea, vomiting, changes in bowel habits, or melena. He is currently on Protonix 40 mg daily, which he has been tolerating well.   Review of Systems  Constitutional: Negative for activity change, appetite change, chills, fatigue and fever.  HENT: Negative for mouth sores, nosebleeds, sore throat and tinnitus.   Respiratory: Negative for cough and wheezing.   Gastrointestinal: Negative for blood in stool.       No changes in bowel habits.  Endocrine: Negative for cold intolerance and heat intolerance.  Genitourinary: Negative for dysuria.  Musculoskeletal: Negative for gait problem and myalgias.  Skin: Negative for rash and wound.  Neurological: Negative for dizziness, tremors, syncope, facial asymmetry and speech difficulty.  Psychiatric/Behavioral: Negative for confusion. The patient is not nervous/anxious.   Rest see  pertinent positives and negatives per HPI.   Current Outpatient Medications on File Prior to Visit  Medication Sig Dispense Refill  . pantoprazole (PROTONIX) 40 MG tablet Take 1 tablet (40 mg total) by mouth daily. 30 tablet 3  . SILDENAFIL CITRATE PO Take 30 mg by mouth as needed. 30 mg chewable tablet. 1 to 2 tablets 30 to 45 minutes before sexual activity as needed     No current facility-administered medications on file prior to visit.      Past Medical History:  Diagnosis Date  . Diabetes mellitus without complication ([place])   . Hyperlipidemia   . Hypertension    No Known Allergies  Social History   Socioeconomic History  . Marital status: Married    Spouse name: Not on file  . Number of children: 3  . Years of education: 44  . Highest education level: Not on file  Occupational History  . Occupation: Security  Social Needs  . Financial resource strain: Not on file  . Food insecurity    Worry: Not on file    Inability: Not on file  . Transportation needs    Medical: Not on file    Non-medical: Not on file  Tobacco Use  . Smoking status: Never Smoker  . Smokeless tobacco: Never Used  Substance and Sexual Activity  . Alcohol use: Yes    Comment: Rarely  . Drug use: No  . Sexual activity: Not on file  Lifestyle  . Physical activity    Days per week: Not on file    Minutes  per session: Not on file  . Stress: Not on file  Relationships  . Social Herbalist on phone: Not on file    Gets together: Not on file    Attends religious service: Not on file    Active member of club or organization: Not on file    Attends meetings of clubs or organizations: Not on file    Relationship status: Not on file  Other Topics Concern  . Not on file  Social History Narrative   Fun/hobby: Keeping up with children (6, 4, 14)    Vitals:   06/01/19 1408 06/01/19 1449  BP: (!) 250/155 (!) 200/140  Pulse:    Resp:    Temp:    SpO2:     Body mass index is 36.51  kg/m.  Physical Exam  Nursing note reviewed. Constitutional: He is oriented to person, place, and time. He appears well-developed. No distress.  HENT:  Head: Normocephalic and atraumatic.  Mouth/Throat: Oropharynx is clear and moist and mucous membranes are normal.  Eyes: Pupils are equal, round, and reactive to light. Conjunctivae are normal.  Cardiovascular: Normal rate and regular rhythm.  No murmur heard. Respiratory: Effort normal and breath sounds normal. No respiratory distress.  GI: Soft. He exhibits no abdominal bruit, no pulsatile midline mass and no mass. There is no hepatomegaly. There is no abdominal tenderness.  Musculoskeletal:        General: No edema.  Lymphadenopathy:    He has no cervical adenopathy.  Neurological: He is alert and oriented to person, place, and time. He has normal strength. No cranial nerve deficit. Gait normal.  Reflex Scores:      Bicep reflexes are 2+ on the right side and 2+ on the left side.      Patellar reflexes are 2+ on the right side and 2+ on the left side. Skin: Skin is warm. No rash noted. No erythema.  Psychiatric: He has a normal mood and affect. Cognition and memory are normal.  Well groomed, good eye contact.    ASSESSMENT AND PLAN:  Mr. Paul Rivers was seen today for follow-up.  Diagnoses and all orders for this visit:  Orders Placed This Encounter  Procedures  . Basic metabolic panel  . CBC with Differential/Platelet  . TSH  . Aldosterone + renin activity w/ ratio  . Ambulatory referral to Cardiology  . EKG 12-Lead  . VAS US RENAL ARTERY DUPLEX   Lab Results  Component Value Date   CREATININE 2.39 (H) 06/01/2019   BUN 26 (H) 06/01/2019   NA 139 06/01/2019   K 3.1 (L) 06/01/2019   CL 96 06/01/2019   CO2 32 06/01/2019   Lab Results  Component Value Date   WBC 5.4 06/01/2019   HGB 13.7 06/01/2019   HCT 41.2 06/01/2019   MCV 81.9 06/01/2019   PLT 304.0 06/01/2019   Lab Results  Component Value Date   TSH 1.58  06/01/2019    Severe hypertension Here in the office her received Clonidine 0.1 mg x 1. We discussed possible etiologies of HTN, including secondary HTN. BP improved some, closer to his BP in the past (187/115,170/100).  Complications of elevated BP even when asymptomatic like in this case were also discussed.  He understands side effects of Benazepril and the possibility of aggravating BP if hx of renal artery stenosis. Amlodipine dose increased from 5 mg to 10 mg daily. Benazepril 10 mg daily added today. BP monitoring twice daily recommended. Low-salt diet recommended.  He was clearly instructed about warning signs. Will check BMP in a week. Follow-up in 7 to 10 days, before if needed.  -     amLODipine (NORVASC) 10 MG tablet; Take 1 tablet (10 mg total) by mouth daily. -     benazepril (LOTENSIN) 10 MG tablet; Take 1 tablet (10 mg total) by mouth daily.  Abnormal EKG Some abnormalities have been present in prior EKG's but inverted T wave in DI is new,? Left ventricular strain vs lateral ischemia. LAD, voltage criteria for LVH. He is not having CP or any other symptom that suggest acute coronary syndrome, so ER evaluation was not recommended. Appointment with cardiology will be arranged. He understands warning signs.  -     Ambulatory referral to Cardiology  Epigastric abdominal pain Dyspepsia/gastritis. Improved. He has already identified exacerbating factors, so recommend not skipping meals. Continue pantoprazole 40 mg daily. GERD precautions also discussed.  Hypokalemia K+ rich diet for now. Further recommendations will be given according to lab results.  Abnormal renal function test BP must be better controlled. Benazepril added today. Low salt diet and avoidance of NSAID's recommended.  60 min of total observation here in the office with periodic BP monitoring + 25 min OV.  Return in about 10 days (around 06/11/2019).    Betty G. Martinique, MD  [facility]. [place] office.

## 2019-06-02 NOTE — Progress Notes (Signed)
Cardiology Office Note:   Date:  06/03/2019  NAME:  Paul Rivers    MRN: [id number] DOB:  June 20, 1981   PCP:  Martinique, Betty G, MD  Cardiologist:  No primary care provider on file.  Electrophysiologist:  None   Referring MD: Martinique, Betty G, MD   Chief Complaint  Patient presents with  . Abnormal ECG   History of Present Illness:   Paul Rivers is a 38 y.o. male with a hx of hypertension, hyperlipidemia, diabetes who is being seen today for the evaluation of abnormal EKG at the request of Martinique, Malka So, MD. He was seen by his primary care physician 2 days ago and had severely elevated BP in office (200/100). He was started on norvasc 10 mg QD and benazepril 10 mg. EKG demonstrated LVH with repolarization abnormality.  Laboratory data checked by his prior care physician yesterday demonstrated rising creatinine up to 2.4 from 1.9 previously.  Blood pressure in our office is around 245/150 and this was the same on multiple attempts.  Surprisingly, he reports no symptoms of chest pain or trouble breathing.  He reports no decrease in urine output.  He also reports no lower extremity edema.  It appears he is only on amlodipine 10 mg and is taking benazepril 10 mg.  His EKG demonstrates sinus tachycardia with LVH.  There are no acute ST-T changes.  His examination is consistent with an S4.  Overall, I am quite concerned given his high level of blood pressure.  I think this merits emergency room evaluation, as I will be unable to get this under control as an outpatient in a safe manner.   Past Medical History: Past Medical History:  Diagnosis Date  . Diabetes mellitus without complication ([place])   . Hyperlipidemia   . Hypertension     Past Surgical History: History reviewed. No pertinent surgical history.  Current Medications: Current Meds  Medication Sig  . amLODipine (NORVASC) 10 MG tablet Take 1 tablet (10 mg total) by mouth daily.  . benazepril (LOTENSIN) 10 MG tablet Take 1 tablet  (10 mg total) by mouth daily.  . pantoprazole (PROTONIX) 40 MG tablet Take 1 tablet (40 mg total) by mouth daily.  . potassium chloride SA (KLOR-CON) 20 MEQ tablet Take 1 tablet (20 mEq total) by mouth daily.  Marland Kitchen SILDENAFIL CITRATE PO Take 30 mg by mouth as needed. 30 mg chewable tablet. 1 to 2 tablets 30 to 45 minutes before sexual activity as needed    Allergies:    Patient has no known allergies.   Social History: Social History   Socioeconomic History  . Marital status: Married    Spouse name: Not on file  . Number of children: 3  . Years of education: 57  . Highest education level: Not on file  Occupational History  . Occupation: Security  Social Needs  . Financial resource strain: Not on file  . Food insecurity    Worry: Not on file    Inability: Not on file  . Transportation needs    Medical: Not on file    Non-medical: Not on file  Tobacco Use  . Smoking status: Never Smoker  . Smokeless tobacco: Never Used  Substance and Sexual Activity  . Alcohol use: Yes    Comment: Rarely  . Drug use: No  . Sexual activity: Not on file  Lifestyle  . Physical activity    Days per week: Not on file    Minutes per session: Not on  file  . Stress: Not on file  Relationships  . Social Herbalist on phone: Not on file    Gets together: Not on file    Attends religious service: Not on file    Active member of club or organization: Not on file    Attends meetings of clubs or organizations: Not on file    Relationship status: Not on file  Other Topics Concern  . Not on file  Social History Narrative   Fun/hobby: Keeping up with children (6, 4, 14)     Family History: The patient's family history includes Diabetes in his father and mother; Hypertension in his father, maternal grandmother, and mother.  ROS:   All other ROS reviewed and negative. Pertinent positives noted in the HPI.     EKGs/Labs/Other Studies Reviewed:   The following studies were personally  reviewed by me today:  EKG:  EKG is ordered today.  The ekg ordered today demonstrates sinus tachycardia, heart rate 108, LVH noted, and was personally reviewed by me.   Recent Labs: 02/28/2019: ALT 25 06/01/2019: BUN 26; Creatinine, Ser 2.39; Hemoglobin 13.7; Platelets 304.0; Potassium 3.1; Sodium 139; TSH 1.58   Recent Lipid Panel    Component Value Date/Time   CHOL 204 (H) 02/28/2019 0804   TRIG 94.0 02/28/2019 0804   HDL 41.50 02/28/2019 0804   CHOLHDL 5 02/28/2019 0804   VLDL 18.8 02/28/2019 0804   LDLCALC 143 (H) 02/28/2019 0804    Physical Exam:   VS:  BP (!) 245/148 (BP Location: Left Arm)   Pulse (!) 108   Temp 97.9 F (36.6 C)   Ht 5\' 3"  (1.6 m)   Wt 210 lb 9.6 oz (95.5 kg)   SpO2 100%   BMI 37.31 kg/m    Wt Readings from Last 3 Encounters:  06/03/19 210 lb 9.6 oz (95.5 kg)  06/01/19 206 lb 2 oz (93.5 kg)  05/18/19 209 lb 11.2 oz (95.1 kg)    General: Well nourished, well developed, in no acute distress Heart: Atraumatic, normal size  Eyes: PEERLA, EOMI  Neck: Supple, no JVD Endocrine: No thryomegaly Cardiac: Tachycardia noted, S4 present, no murmurs Lungs: Clear to auscultation bilaterally, no wheezing, rhonchi or rales  Abd: Soft, nontender, no hepatomegaly  Ext: No edema, bounding pulses Musculoskeletal: No deformities, BUE and BLE strength normal and equal Skin: Warm and dry, no rashes   Neuro: Alert and oriented to person, place, time, and situation, CNII-XII grossly intact, no focal deficits  Psych: Normal mood and affect   ASSESSMENT:   NAME@ is a 38 y.o. male who presents for the following: 1. Nonspecific abnormal electrocardiogram (ECG) (EKG)   2. Essential hypertension   3. Hypertensive crisis   4. Mixed hyperlipidemia   5. LVH (left ventricular hypertrophy)     PLAN:   1. Nonspecific abnormal electrocardiogram (ECG) (EKG) 2. Essential hypertension 3. Hypertensive crisis -He presents with acute hypertension with blood pressures on repeat  x3 245/150.  Laboratory data shows that his creatinine is rising.  Creat yesterday was 2.4 up from 1.9 a few months ago.  He surprisingly is relatively asymptomatic.  I discussed with him that his blood pressure is in a range that is not safe for him.  More importantly, I am very concerned about the rising creatinine that he has.  He definitely has evidence of endorgan damage on this consistent with a hypertensive crisis.  I think it is best that he proceed to the emergency room for  evaluation.  I do not think it is wise for him to drive, and his wife will come pick him up to take the emergency room.  Given that this is more consistent with a hypertensive crisis, I do not think I can get his blood pressure to a safe level as an outpatient.  The rising creatinine is most concerning.  4. Mixed hyperlipidemia -We will discuss next visit  5. LVH (left ventricular hypertrophy) -Related to severe hypertension  Disposition: Return in about 1 month (around 07/04/2019).  Medication Adjustments/Labs and Tests Ordered: Current medicines are reviewed at length with the patient today.  Concerns regarding medicines are outlined above.  No orders of the defined types were placed in this encounter.  No orders of the defined types were placed in this encounter.   Patient Instructions  Medication Instructions:  No changes today  Please proceed to [facility] for evaluation of hypertension  Follow-Up: -- 1 month with Dr. Audie Box      Signed, Addison Naegeli. Audie Box, [place]  [address] [place], [place] [address] [phone]  06/03/2019 4:31 PM

## 2019-06-03 ENCOUNTER — Inpatient Hospital Stay (HOSPITAL_COMMUNITY)
Admission: EM | Admit: 2019-06-03 | Discharge: 2019-06-04 | DRG: 305 | Disposition: A | Discharge status: Home / Self Care | Payer: Commercial Managed Care - PPO | Source: Ambulatory Visit | Attending: Internal Medicine | Admitting: Internal Medicine

## 2019-06-03 ENCOUNTER — Encounter (HOSPITAL_COMMUNITY): Payer: Self-pay | Admitting: *Deleted

## 2019-06-03 ENCOUNTER — Other Ambulatory Visit: Payer: Self-pay

## 2019-06-03 ENCOUNTER — Encounter: Payer: Self-pay | Admitting: Cardiovascular Disease

## 2019-06-03 ENCOUNTER — Ambulatory Visit (INDEPENDENT_AMBULATORY_CARE_PROVIDER_SITE_OTHER): Payer: Self-pay | Admitting: Cardiovascular Disease

## 2019-06-03 ENCOUNTER — Emergency Department (HOSPITAL_COMMUNITY): Payer: Commercial Managed Care - PPO

## 2019-06-03 VITALS — BP 245/148 | HR 108 | Temp 97.9°F | Ht 63.0 in | Wt 210.6 lb

## 2019-06-03 DIAGNOSIS — Z20828 Contact with and (suspected) exposure to other viral communicable diseases: Secondary | ICD-10-CM | POA: Diagnosis present

## 2019-06-03 DIAGNOSIS — I517 Cardiomegaly: Secondary | ICD-10-CM

## 2019-06-03 DIAGNOSIS — N184 Chronic kidney disease, stage 4 (severe): Secondary | ICD-10-CM | POA: Diagnosis present

## 2019-06-03 DIAGNOSIS — Z6839 Body mass index (BMI) 39.0-39.9, adult: Secondary | ICD-10-CM

## 2019-06-03 DIAGNOSIS — R9431 Abnormal electrocardiogram [ECG] [EKG]: Secondary | ICD-10-CM

## 2019-06-03 DIAGNOSIS — E782 Mixed hyperlipidemia: Secondary | ICD-10-CM

## 2019-06-03 DIAGNOSIS — I160 Hypertensive urgency: Secondary | ICD-10-CM | POA: Diagnosis present

## 2019-06-03 DIAGNOSIS — E876 Hypokalemia: Secondary | ICD-10-CM | POA: Diagnosis not present

## 2019-06-03 DIAGNOSIS — N183 Chronic kidney disease, stage 3 unspecified: Secondary | ICD-10-CM | POA: Diagnosis present

## 2019-06-03 DIAGNOSIS — I248 Other forms of acute ischemic heart disease: Secondary | ICD-10-CM | POA: Diagnosis present

## 2019-06-03 DIAGNOSIS — I1A0 Resistant hypertension: Secondary | ICD-10-CM | POA: Diagnosis present

## 2019-06-03 DIAGNOSIS — R778 Other specified abnormalities of plasma proteins: Secondary | ICD-10-CM

## 2019-06-03 DIAGNOSIS — R7989 Other specified abnormal findings of blood chemistry: Secondary | ICD-10-CM

## 2019-06-03 DIAGNOSIS — Z8249 Family history of ischemic heart disease and other diseases of the circulatory system: Secondary | ICD-10-CM

## 2019-06-03 DIAGNOSIS — I10 Essential (primary) hypertension: Secondary | ICD-10-CM

## 2019-06-03 DIAGNOSIS — I169 Hypertensive crisis, unspecified: Secondary | ICD-10-CM

## 2019-06-03 DIAGNOSIS — E1122 Type 2 diabetes mellitus with diabetic chronic kidney disease: Secondary | ICD-10-CM | POA: Diagnosis present

## 2019-06-03 DIAGNOSIS — I129 Hypertensive chronic kidney disease with stage 1 through stage 4 chronic kidney disease, or unspecified chronic kidney disease: Secondary | ICD-10-CM | POA: Diagnosis present

## 2019-06-03 DIAGNOSIS — E669 Obesity, unspecified: Secondary | ICD-10-CM | POA: Diagnosis present

## 2019-06-03 DIAGNOSIS — E785 Hyperlipidemia, unspecified: Secondary | ICD-10-CM | POA: Diagnosis present

## 2019-06-03 DIAGNOSIS — E1165 Type 2 diabetes mellitus with hyperglycemia: Secondary | ICD-10-CM | POA: Diagnosis present

## 2019-06-03 DIAGNOSIS — I161 Hypertensive emergency: Secondary | ICD-10-CM | POA: Diagnosis not present

## 2019-06-03 DIAGNOSIS — Z833 Family history of diabetes mellitus: Secondary | ICD-10-CM

## 2019-06-03 DIAGNOSIS — E1169 Type 2 diabetes mellitus with other specified complication: Secondary | ICD-10-CM | POA: Diagnosis present

## 2019-06-03 LAB — COMPREHENSIVE METABOLIC PANEL
ALT: 25 U/L (ref 0–44)
AST: 24 U/L (ref 15–41)
Albumin: 4.2 g/dL (ref 3.5–5.0)
Alkaline Phosphatase: 75 U/L (ref 38–126)
Anion gap: 14 (ref 5–15)
BUN: 24 mg/dL — ABNORMAL HIGH (ref 6–20)
CO2: 27 mmol/L (ref 22–32)
Calcium: 9 mg/dL (ref 8.9–10.3)
Chloride: 94 mmol/L — ABNORMAL LOW (ref 98–111)
Creatinine, Ser: 2.41 mg/dL — ABNORMAL HIGH (ref 0.61–1.24)
GFR calc Af Amer: 38 mL/min — ABNORMAL LOW (ref >60)
GFR calc non Af Amer: 33 mL/min — ABNORMAL LOW (ref >60)
Glucose, Bld: 150 mg/dL — ABNORMAL HIGH (ref 70–99)
Potassium: 2.4 mmol/L — CL (ref 3.5–5.1)
Sodium: 135 mmol/L (ref 135–145)
Total Bilirubin: 0.3 mg/dL (ref 0.3–1.2)
Total Protein: 7.5 g/dL (ref 6.5–8.1)

## 2019-06-03 LAB — CBC WITH DIFFERENTIAL/PLATELET
Abs Immature Granulocytes: 0.02 10*3/uL (ref 0.00–0.07)
Basophils Absolute: 0.1 10*3/uL (ref 0.0–0.1)
Basophils Relative: 1 %
Eosinophils Absolute: 0.1 10*3/uL (ref 0.0–0.5)
Eosinophils Relative: 2 %
HCT: 40.8 % (ref 39.0–52.0)
Hemoglobin: 13.5 g/dL (ref 13.0–17.0)
Immature Granulocytes: 0 %
Lymphocytes Relative: 19 %
Lymphs Abs: 1 10*3/uL (ref 0.7–4.0)
MCH: 27.4 pg (ref 26.0–34.0)
MCHC: 33.1 g/dL (ref 30.0–36.0)
MCV: 82.8 fL (ref 80.0–100.0)
Monocytes Absolute: 0.5 10*3/uL (ref 0.1–1.0)
Monocytes Relative: 9 %
Neutro Abs: 3.6 10*3/uL (ref 1.7–7.7)
Neutrophils Relative %: 69 %
Platelets: 302 10*3/uL (ref 150–400)
RBC: 4.93 MIL/uL (ref 4.22–5.81)
RDW: 13.1 % (ref 11.5–15.5)
WBC: 5.3 10*3/uL (ref 4.0–10.5)
nRBC: 0 % (ref 0.0–0.2)

## 2019-06-03 LAB — TROPONIN I (HIGH SENSITIVITY)
Troponin I (High Sensitivity): 46 ng/L — ABNORMAL HIGH (ref <18)
Troponin I (High Sensitivity): 54 ng/L — ABNORMAL HIGH (ref <18)

## 2019-06-03 LAB — MAGNESIUM: Magnesium: 2.1 mg/dL (ref 1.7–2.4)

## 2019-06-03 LAB — SARS CORONAVIRUS 2 (TAT 6-24 HRS): SARS Coronavirus 2: NEGATIVE

## 2019-06-03 MED ORDER — INFLUENZA VAC SPLIT QUAD 0.5 ML IM SUSY: 0.5000 mL | PREFILLED_SYRINGE | INTRAMUSCULAR | Status: DC

## 2019-06-03 MED ORDER — LABETALOL HCL 100 MG PO TABS
100.0000 mg | ORAL_TABLET | Freq: Two times a day (BID) | ORAL | Status: DC
  Administered 2019-06-03 – 2019-06-04 (×2): 100 mg via ORAL
  Filled 2019-06-03 (×3): qty 1

## 2019-06-03 MED ORDER — LABETALOL HCL 5 MG/ML IV SOLN
20.0000 mg | Freq: Once | INTRAVENOUS | Status: AC
  Administered 2019-06-03: 20 mg via INTRAVENOUS
  Filled 2019-06-03: qty 4

## 2019-06-03 MED ORDER — POTASSIUM CHLORIDE CRYS ER 20 MEQ PO TBCR
40.0000 meq | EXTENDED_RELEASE_TABLET | ORAL | Status: AC
  Administered 2019-06-03 – 2019-06-04 (×3): 40 meq via ORAL
  Filled 2019-06-03 (×3): qty 2

## 2019-06-03 MED ORDER — HYDRALAZINE HCL 20 MG/ML IJ SOLN
10.0000 mg | Freq: Once | INTRAMUSCULAR | Status: AC
  Administered 2019-06-03: 10 mg via INTRAVENOUS
  Filled 2019-06-03: qty 1

## 2019-06-03 MED ORDER — AMLODIPINE BESYLATE 10 MG PO TABS
10.0000 mg | ORAL_TABLET | Freq: Every day | ORAL | Status: DC
  Administered 2019-06-03 – 2019-06-04 (×2): 10 mg via ORAL
  Filled 2019-06-03: qty 1
  Filled 2019-06-03: qty 2

## 2019-06-03 MED ORDER — POTASSIUM CHLORIDE 10 MEQ/100ML IV SOLN
10.0000 meq | Freq: Once | INTRAVENOUS | Status: AC
  Administered 2019-06-03: 10 meq via INTRAVENOUS
  Filled 2019-06-03: qty 100

## 2019-06-03 NOTE — ED Notes (Signed)
Ordered HH Tray

## 2019-06-03 NOTE — ED Provider Notes (Signed)
[facility] EMERGENCY DEPARTMENT Provider Note   CSN: [id number] Arrival date & time: 06/03/19  1600     History   Chief Complaint Chief Complaint  Patient presents with  . Hypertension  . Abnormal Lab    elevated kidney function    HPI Paul Rivers is a 38 y.o. male.     HPI  The patient has a history of diabetes mellitus type 2 (managed with diet, recent Hgb A1c improved), HLD, HTN, obesity who presents to the ED from his cardiologists office for uncontrolled hypertension with a BP of 230/110. He normally takes Amlodipine for his blood pressure at night. He took the medication last night. Today, he presented to his cardiologists office for a routine visit. He was seen by his primary care physician 2 days ago and had severely elevated BP in the office to 200/100. He was started on Norvasc 10mg  qd and benazepril 10mg . EKG demonstrated LVH with repolarization abnormalities. Labs concerning for an elevated Cr to 2.4 from 1.9. His BP was 245/150 in the office today on several attempts. He denies any symptoms, specifically, no headaches, vision changes, chest pain, shortness of breath, nausea, vomiting, abdominal pain. He was sent to the ED for further management. In the ED, he continued to remain asymptomatic, however, his BP on arrival was 246/140.  Past Medical History:  Diagnosis Date  . Diabetes mellitus without complication ([place])   . Hyperlipidemia   . Hypertension     Patient Active Problem List   Diagnosis Date Noted  . Hyperlipidemia associated with type 2 diabetes mellitus ([place]) 02/22/2019  . Essential hypertension, benign 10/22/2016  . Class 2 obesity due to excess calories with serious comorbidity and body mass index (BMI) of 39.0 to 39.9 in adult 10/22/2016  . Type 2 diabetes mellitus with other specified complication ([place]) 0000000    History reviewed. No pertinent surgical history.      Home Medications    Prior to Admission medications    Medication Sig Start Date End Date Taking? Authorizing Provider  amLODipine (NORVASC) 10 MG tablet Take 1 tablet (10 mg total) by mouth daily. 06/01/19  Yes Martinique, Betty G, MD  pantoprazole (PROTONIX) 40 MG tablet Take 1 tablet (40 mg total) by mouth daily. 05/18/19  Yes Burchette, Alinda Sierras, MD    Family History Family History  Problem Relation Age of Onset  . Diabetes Mother   . Hypertension Mother   . Diabetes Father   . Hypertension Father   . Hypertension Maternal Grandmother     Social History Social History   Tobacco Use  . Smoking status: Never Smoker  . Smokeless tobacco: Never Used  Substance Use Topics  . Alcohol use: Yes    Comment: Rarely  . Drug use: No     Allergies   Patient has no known allergies.   Review of Systems Review of Systems  Constitutional: Negative for chills and fever.  HENT: Negative for sore throat.   Eyes: Negative for visual disturbance.  Respiratory: Negative for cough and shortness of breath.   Cardiovascular: Negative for chest pain and palpitations.  Gastrointestinal: Negative for abdominal pain, nausea and vomiting.  Genitourinary: Negative for dysuria and hematuria.  Musculoskeletal: Negative for arthralgias and back pain.  Skin: Negative for color change and rash.  Neurological: Negative for dizziness, seizures, syncope, weakness, light-headedness and headaches.  All other systems reviewed and are negative.    Physical Exam Updated Vital Signs BP (!) 203/130   Pulse  78   Temp 98.4 F (36.9 C) (Oral)   Resp (!) 0   SpO2 100%   Physical Exam Vitals signs and nursing note reviewed.  Constitutional:      Appearance: He is well-developed.  HENT:     Head: Normocephalic and atraumatic.  Eyes:     Conjunctiva/sclera: Conjunctivae normal.  Neck:     Musculoskeletal: Neck supple.  Cardiovascular:     Rate and Rhythm: Regular rhythm. Tachycardia present.  Pulmonary:     Effort: Pulmonary effort is normal. No  respiratory distress.     Breath sounds: Normal breath sounds.  Abdominal:     Palpations: Abdomen is soft.     Tenderness: There is no abdominal tenderness.  Skin:    General: Skin is warm and dry.  Neurological:     Mental Status: He is alert.      ED Treatments / Results  Labs (all labs ordered are listed, but only abnormal results are displayed) Labs Reviewed  COMPREHENSIVE METABOLIC PANEL - Abnormal; Notable for the following components:      Result Value   Potassium 2.4 (*)    Chloride 94 (*)    Glucose, Bld 150 (*)    BUN 24 (*)    Creatinine, Ser 2.41 (*)    GFR calc non Af Amer 33 (*)    GFR calc Af Amer 38 (*)    All other components within normal limits  TROPONIN I (HIGH SENSITIVITY) - Abnormal; Notable for the following components:   Troponin I (High Sensitivity) 46 (*)    All other components within normal limits  SARS CORONAVIRUS 2 (TAT 6-24 HRS)  CBC WITH DIFFERENTIAL/PLATELET  MAGNESIUM  TROPONIN I (HIGH SENSITIVITY)    EKG EKG Interpretation  Date/Time:  Friday June 03 2019 17:29:00 EDT Ventricular Rate:  101 PR Interval:    QRS Duration: 109 QT Interval:  371 QTC Calculation: 481 R Axis:   18 Text Interpretation:  Sinus tachycardia Consider right atrial enlargement Probable left ventricular hypertrophy ST elev, probable normal early repol pattern Borderline prolonged QT interval similar to Feb 2018 Confirmed by Sherwood Gambler [phone]) on 06/03/2019 5:54:17 PM   Radiology Dg Chest Portable 1 View  Result Date: 06/03/2019 CLINICAL DATA:  uncontrolled HTN EXAM: PORTABLE CHEST 1 VIEW COMPARISON:  None. FINDINGS: The mediastinal contours are within normal limits for AP technique. Cardiomegaly. The lungs are clear. No pneumothorax or large pleural effusion. No acute finding in the upper abdomen. IMPRESSION: Cardiomegaly without evidence of pulmonary edema or infection. Electronically Signed   By: Audie Pinto M.D.   On: 06/03/2019 18:35     Procedures Procedures (including critical care time)  Medications Ordered in ED Medications  potassium chloride SA (KLOR-CON) CR tablet 40 mEq (40 mEq Oral Given 06/03/19 1912)  potassium chloride 10 mEq in 100 mL IVPB (10 mEq Intravenous New Bag/Given 06/03/19 1914)  amLODipine (NORVASC) tablet 10 mg (has no administration in time range)  labetalol (NORMODYNE) tablet 100 mg (has no administration in time range)  hydrALAZINE (APRESOLINE) injection 10 mg (has no administration in time range)  labetalol (NORMODYNE) injection 20 mg (20 mg Intravenous Given 06/03/19 1758)     Initial Impression / Assessment and Plan / ED Course  I have reviewed the triage vital signs and the nursing notes.  Pertinent labs & imaging results that were available during my care of the patient were reviewed by me and considered in my medical decision making (see chart for details).  Clinical Course  as of Jun 02 1914  Fri Jun 03, 2019  1726 BP(!): 246/140 [JL]  1726 Pulse Rate(!): 109 [JL]  1827 Potassium(!!): 2.4 [JL]  1913 Troponin I (High Sensitivity)(!): 46 [JL]  1913 Magnesium: 2.1 [JL]    Clinical Course User Index [JL] Regan Lemming, MD       The patient is a 38 year old male with a history of diabetes mellitus type 2 (managed with diet, recent Hgb A1c improved), HLD, HTN, obesity who presents to the ED from cardiology clinic for hypertensive emergency. On arrival, he was asymptomatic, hypertensive with a BP of 245/140, mildly tachycardic rate 109, satting 100% on room air. He denies fevers, chills, or infectious symptoms. He denies chest pain or shortness of breath. Low suspicion for ACS, PE, or aortic dissection at this time. Favor likely hypertensive emergency in the setting of failed outpatient management of his HTN.   EKG: Sinus tachycardia, rate 101, with ST elevations considered to be benign early repolarization. LVH and right atrial enlargement present. His QTc was 481.  CXR: Cardiomegaly but  otherwise no acute findings.  Labs: Significant for a potassium of 2.4 for which oral and IV replenishment was ordered. A troponin was mildly elevated to 46. His magnesium was normal.   The patient was administered 20mg  IV Labetalol which resulted in some improvement in his hypertension to 203/130 with improvement in his rate to 78. An additional 10mg  IV Hydralazine was ordered. Hospitalist medicine was consulted for admission for management of hypertensive emergency.  Final Clinical Impressions(s) / ED Diagnoses   Final diagnoses:  Hypokalemia  Hypertensive emergency    ED Discharge Orders    None       Regan Lemming, MD 06/03/19 [id number]    Sherwood Gambler, MD 06/03/19 Jac Canavan, MD 06/13/19 [phone]

## 2019-06-03 NOTE — ED Triage Notes (Addendum)
Pt sent here by cardiologist for bp of 230/110.  BP 246/145 manually in triage (256/154 on monitor the 2nd time).  They also stated his kidney function was elevated.

## 2019-06-03 NOTE — Patient Instructions (Signed)
Medication Instructions:  No changes today  Please proceed to Paul Rivers for evaluation of hypertension  Follow-Up: -- 1 month with Dr. Audie Box

## 2019-06-04 ENCOUNTER — Encounter (HOSPITAL_COMMUNITY): Payer: Self-pay | Admitting: Internal Medicine

## 2019-06-04 DIAGNOSIS — I248 Other forms of acute ischemic heart disease: Secondary | ICD-10-CM | POA: Diagnosis present

## 2019-06-04 DIAGNOSIS — N183 Chronic kidney disease, stage 3 unspecified: Secondary | ICD-10-CM | POA: Diagnosis present

## 2019-06-04 DIAGNOSIS — E669 Obesity, unspecified: Secondary | ICD-10-CM | POA: Diagnosis present

## 2019-06-04 DIAGNOSIS — E876 Hypokalemia: Secondary | ICD-10-CM | POA: Diagnosis present

## 2019-06-04 DIAGNOSIS — E1169 Type 2 diabetes mellitus with other specified complication: Secondary | ICD-10-CM | POA: Diagnosis present

## 2019-06-04 DIAGNOSIS — I160 Hypertensive urgency: Secondary | ICD-10-CM

## 2019-06-04 DIAGNOSIS — E1165 Type 2 diabetes mellitus with hyperglycemia: Secondary | ICD-10-CM | POA: Diagnosis present

## 2019-06-04 DIAGNOSIS — Z8249 Family history of ischemic heart disease and other diseases of the circulatory system: Secondary | ICD-10-CM | POA: Diagnosis not present

## 2019-06-04 DIAGNOSIS — Z833 Family history of diabetes mellitus: Secondary | ICD-10-CM | POA: Diagnosis not present

## 2019-06-04 DIAGNOSIS — Z6839 Body mass index (BMI) 39.0-39.9, adult: Secondary | ICD-10-CM | POA: Diagnosis not present

## 2019-06-04 DIAGNOSIS — N1831 Chronic kidney disease, stage 3a: Secondary | ICD-10-CM

## 2019-06-04 DIAGNOSIS — I161 Hypertensive emergency: Secondary | ICD-10-CM | POA: Diagnosis present

## 2019-06-04 DIAGNOSIS — E785 Hyperlipidemia, unspecified: Secondary | ICD-10-CM | POA: Diagnosis present

## 2019-06-04 DIAGNOSIS — I129 Hypertensive chronic kidney disease with stage 1 through stage 4 chronic kidney disease, or unspecified chronic kidney disease: Secondary | ICD-10-CM | POA: Diagnosis present

## 2019-06-04 DIAGNOSIS — E1122 Type 2 diabetes mellitus with diabetic chronic kidney disease: Secondary | ICD-10-CM | POA: Diagnosis present

## 2019-06-04 DIAGNOSIS — E1129 Type 2 diabetes mellitus with other diabetic kidney complication: Secondary | ICD-10-CM | POA: Insufficient documentation

## 2019-06-04 DIAGNOSIS — I10 Essential (primary) hypertension: Secondary | ICD-10-CM

## 2019-06-04 DIAGNOSIS — Z20828 Contact with and (suspected) exposure to other viral communicable diseases: Secondary | ICD-10-CM | POA: Diagnosis present

## 2019-06-04 DIAGNOSIS — N184 Chronic kidney disease, stage 4 (severe): Secondary | ICD-10-CM | POA: Diagnosis present

## 2019-06-04 HISTORY — DX: Chronic kidney disease, stage 4 (severe): N18.4

## 2019-06-04 LAB — CBC WITH DIFFERENTIAL/PLATELET
Abs Immature Granulocytes: 0.02 10*3/uL (ref 0.00–0.07)
Basophils Absolute: 0 10*3/uL (ref 0.0–0.1)
Basophils Relative: 1 %
Eosinophils Absolute: 0.1 10*3/uL (ref 0.0–0.5)
Eosinophils Relative: 1 %
HCT: 38.8 % — ABNORMAL LOW (ref 39.0–52.0)
Hemoglobin: 13.4 g/dL (ref 13.0–17.0)
Immature Granulocytes: 0 %
Lymphocytes Relative: 12 %
Lymphs Abs: 0.7 10*3/uL (ref 0.7–4.0)
MCH: 28 pg (ref 26.0–34.0)
MCHC: 34.5 g/dL (ref 30.0–36.0)
MCV: 81 fL (ref 80.0–100.0)
Monocytes Absolute: 0.4 10*3/uL (ref 0.1–1.0)
Monocytes Relative: 7 %
Neutro Abs: 4.5 10*3/uL (ref 1.7–7.7)
Neutrophils Relative %: 79 %
Platelets: 271 10*3/uL (ref 150–400)
RBC: 4.79 MIL/uL (ref 4.22–5.81)
RDW: 13.2 % (ref 11.5–15.5)
WBC: 5.7 10*3/uL (ref 4.0–10.5)
nRBC: 0 % (ref 0.0–0.2)

## 2019-06-04 LAB — BASIC METABOLIC PANEL
Anion gap: 11 (ref 5–15)
Anion gap: 13 (ref 5–15)
BUN: 21 mg/dL — ABNORMAL HIGH (ref 6–20)
BUN: 18 mg/dL (ref 6–20)
CO2: 29 mmol/L (ref 22–32)
CO2: 26 mmol/L (ref 22–32)
Calcium: 9.1 mg/dL (ref 8.9–10.3)
Calcium: 9.1 mg/dL (ref 8.9–10.3)
Chloride: 96 mmol/L — ABNORMAL LOW (ref 98–111)
Chloride: 97 mmol/L — ABNORMAL LOW (ref 98–111)
Creatinine, Ser: 2.2 mg/dL — ABNORMAL HIGH (ref 0.61–1.24)
Creatinine, Ser: 2.3 mg/dL — ABNORMAL HIGH (ref 0.61–1.24)
GFR calc Af Amer: 43 mL/min — ABNORMAL LOW (ref >60)
GFR calc Af Amer: 41 mL/min — ABNORMAL LOW (ref >60)
GFR calc non Af Amer: 37 mL/min — ABNORMAL LOW (ref >60)
GFR calc non Af Amer: 35 mL/min — ABNORMAL LOW (ref >60)
Glucose, Bld: 147 mg/dL — ABNORMAL HIGH (ref 70–99)
Glucose, Bld: 216 mg/dL — ABNORMAL HIGH (ref 70–99)
Potassium: 2.8 mmol/L — ABNORMAL LOW (ref 3.5–5.1)
Potassium: 3.1 mmol/L — ABNORMAL LOW (ref 3.5–5.1)
Sodium: 136 mmol/L (ref 135–145)
Sodium: 136 mmol/L (ref 135–145)

## 2019-06-04 LAB — HEPATIC FUNCTION PANEL
ALT: 24 U/L (ref 0–44)
AST: 23 U/L (ref 15–41)
Albumin: 3.8 g/dL (ref 3.5–5.0)
Alkaline Phosphatase: 69 U/L (ref 38–126)
Bilirubin, Direct: <0.1 mg/dL (ref 0.0–0.2)
Total Bilirubin: 0.6 mg/dL (ref 0.3–1.2)
Total Protein: 7.3 g/dL (ref 6.5–8.1)

## 2019-06-04 LAB — TSH: TSH: 2.362 u[IU]/mL (ref 0.350–4.500)

## 2019-06-04 LAB — HEMOGLOBIN A1C
Hgb A1c MFr Bld: 6.3 % — ABNORMAL HIGH (ref 4.8–5.6)
Mean Plasma Glucose: 134.11 mg/dL

## 2019-06-04 LAB — HIV ANTIBODY (ROUTINE TESTING W REFLEX): HIV Screen 4th Generation wRfx: NONREACTIVE

## 2019-06-04 LAB — TROPONIN I (HIGH SENSITIVITY)
Troponin I (High Sensitivity): 52 ng/L — ABNORMAL HIGH (ref <18)
Troponin I (High Sensitivity): 42 ng/L — ABNORMAL HIGH (ref <18)

## 2019-06-04 LAB — MAGNESIUM: Magnesium: 2.5 mg/dL — ABNORMAL HIGH (ref 1.7–2.4)

## 2019-06-04 MED ORDER — LABETALOL HCL 100 MG PO TABS: 100.0000 mg | ORAL_TABLET | Freq: Two times a day (BID) | ORAL | 3 refills | Status: DC

## 2019-06-04 MED ORDER — ACETAMINOPHEN 650 MG RE SUPP: 650.0000 mg | Freq: Four times a day (QID) | RECTAL | Status: DC | PRN

## 2019-06-04 MED ORDER — HYDRALAZINE HCL 25 MG PO TABS: 25.0000 mg | ORAL_TABLET | Freq: Two times a day (BID) | ORAL | 3 refills | Status: DC

## 2019-06-04 MED ORDER — ADULT MULTIVITAMIN W/MINERALS CH
1.0000 | ORAL_TABLET | Freq: Every day | ORAL | Status: DC
  Administered 2019-06-04: 1 via ORAL
  Filled 2019-06-04: qty 1

## 2019-06-04 MED ORDER — ACETAMINOPHEN 325 MG PO TABS: 650.0000 mg | ORAL_TABLET | Freq: Four times a day (QID) | ORAL | Status: DC | PRN

## 2019-06-04 MED ORDER — MAGNESIUM SULFATE IN D5W 1-5 GM/100ML-% IV SOLN
1.0000 g | Freq: Once | INTRAVENOUS | Status: AC
  Administered 2019-06-04: 1 g via INTRAVENOUS
  Filled 2019-06-04: qty 100

## 2019-06-04 MED ORDER — ONDANSETRON HCL 4 MG/2ML IJ SOLN: 4.0000 mg | Freq: Four times a day (QID) | INTRAMUSCULAR | Status: DC | PRN

## 2019-06-04 MED ORDER — MAGNESIUM SULFATE IN D5W 1-5 GM/100ML-% IV SOLN: 1.0000 g | Freq: Once | INTRAVENOUS | Status: DC

## 2019-06-04 MED ORDER — HYDRALAZINE HCL 25 MG PO TABS
25.0000 mg | ORAL_TABLET | Freq: Three times a day (TID) | ORAL | Status: DC
  Administered 2019-06-04: 25 mg via ORAL
  Filled 2019-06-04: qty 1

## 2019-06-04 MED ORDER — POTASSIUM CHLORIDE 10 MEQ/100ML IV SOLN
10.0000 meq | INTRAVENOUS | Status: AC
  Administered 2019-06-04 (×4): 10 meq via INTRAVENOUS
  Filled 2019-06-04 (×4): qty 100

## 2019-06-04 MED ORDER — HYDRALAZINE HCL 25 MG PO TABS: 25.0000 mg | ORAL_TABLET | Freq: Three times a day (TID) | ORAL | Status: DC | PRN

## 2019-06-04 MED ORDER — LABETALOL HCL 5 MG/ML IV SOLN
10.0000 mg | INTRAVENOUS | Status: DC | PRN
  Administered 2019-06-04 (×2): 10 mg via INTRAVENOUS
  Filled 2019-06-04 (×2): qty 4

## 2019-06-04 MED ORDER — ONDANSETRON HCL 4 MG PO TABS: 4.0000 mg | ORAL_TABLET | Freq: Four times a day (QID) | ORAL | Status: DC | PRN

## 2019-06-04 MED ORDER — POTASSIUM CHLORIDE CRYS ER 20 MEQ PO TBCR
40.0000 meq | EXTENDED_RELEASE_TABLET | Freq: Once | ORAL | Status: AC
  Administered 2019-06-04: 40 meq via ORAL
  Filled 2019-06-04: qty 4

## 2019-06-04 MED ORDER — ONDANSETRON HCL 4 MG PO TABS: 4.0000 mg | ORAL_TABLET | Freq: Four times a day (QID) | ORAL | 0 refills | Status: DC | PRN

## 2019-06-04 MED ORDER — ENSURE MAX PROTEIN PO LIQD
11.0000 [oz_av] | Freq: Every day | ORAL | Status: DC
  Filled 2019-06-04: qty 330

## 2019-06-04 MED ORDER — AMLODIPINE BESYLATE 10 MG PO TABS: 10.0000 mg | ORAL_TABLET | Freq: Every day | ORAL | 1 refills | Status: DC

## 2019-06-04 NOTE — Discharge Summary (Addendum)
Physician Discharge Summary  Paul Rivers [id number] DOB: Jul 15, 1981 DOA: 06/03/2019  PCP: Martinique, Betty G, MD  Admit date: 06/03/2019 Discharge date: 06/05/2019  Recommendations for Outpatient Follow-up:  1. Follow up with PCP in 1-2 weeks 2. Please check BMP for potassium level and magnesium level 1. Given the patient has hypokalemia, urine aldosterone ratio and plasma metanephrines were checked, result is pending. Please follow up.    Home Health: none Equipment/Devices: none    Discharge Condition: stable CODE STATUS: fall Diet recommendation: heart diet  Brief/Interim Summary (HPI)  Paul Rivers is a 38 y.o. male with history of hypertension and probably chronic kidney disease stage III and hyperglycemia was referred to cardiologist for uncontrolled hypertension.  Patient blood pressure was found to be markedly elevated and patient was referred to the ER.  Patient denies any headache visual symptoms chest pain or shortness of breath.  Has been on amlodipine which has been recently increased dose to 10 mg.  Patient has chronic hypokalemia.  ED Course: In the ER EKG shows sinus tachycardia with nonspecific ST-T changes.  Labs show potassium 2.4 blood glucose 150 creatinine 2.4 which has been mildly increased from July 2020.  High-sensitivity troponin was 46 and 54.  Hemoglobin 13.4 platelets 302 COVID-19 test was negative.  Patient blood pressure was markedly elevated at presentation with systolic more than [id number].  Was given IV labetalol his home dose of amlodipine and labetalol 200 mg p.o. twice daily was started.  At the time of my exam patient is asymptomatic.  Discharge Diagnoses and Hospital Course:   Principal Problem:   Hypertensive urgency Active Problems:   Essential hypertension, benign   Hyperlipidemia associated with type 2 diabetes mellitus (H[facility])   Hypokalemia   CKD (chronic kidney disease), stage III   Hypertensive emergency   Elevated  troponin  Addendum: 7:16 PM 06/05/19. Dr. Audie Box of card from Cardiology ordered 2D echo today (10/11), but the pt was discharged yesterday. I called pt by phone. He state that he dose not have any chest pain or SOB. I asked him to do 2D echo when he follows up with his PCP. He agreed to do so. I also recommended that he should comes back to hospital or call his PCP if he develops any chest pain, SOB or significantly elevated blood pressure.   Hypertensive urgency: bp elevated up to 254/171. Pt was treated with amlodipine, IV labetalol as needed, oral labetalol 100 mg twice daily last night.  Blood pressure still elevated at 198/123.  Then 25 mg of hydralazine was added to the regimen.  Blood pressure improved to 160/101.  Patient denies any chest pain, shortness of breath, cough.  No GI symptoms.  No focal signs. Pt would like to go home today. -continue amlodipine 10 mg daily, hydralazine 25 mg twice daily, labetalol 100 mg twice daily -Follow-up with PCP in 1 week  Elevated trop: his trop 54 -->52 -->42. Pt has no CP or SOB. I discussed with Dr. Bronson Ing of cardiology by phone. He agreed that this is most likely due to demand ischemia. Will not pursue further work up at this moment.  Essential hypertension, benign: -see above  Hyperlipidemia associated with type 2 diabetes mellitus ([place]): -follow with PCP  Hypokalemia: K 2.8. -pt was given IV KCl 10 mEQ x 4 and and oral Kdur 40 mEq x 3 . His repeated check K 3.1 -->will give 40 mEq of Kdur before he leaves hospital -received 1 gram of magnesium sulfate  CKD (  chronic kidney disease), stage III: baseline Cre 2.9 to 2.4. his Cre is 2.2 at admission. -follow with PCP  Type II diabetes mellitus with renal manifestations ([place]): diet controled. Blood sugar 147 -f/u with PCP   Discharge Instructions  Discharge Instructions    Call MD for:  extreme fatigue   Complete by: As directed    Call MD for:  persistant dizziness or  light-headedness   Complete by: As directed    Call MD for:  severe uncontrolled pain   Complete by: As directed    Call MD for:  temperature >100.4   Complete by: As directed    Diet - low sodium heart healthy   Complete by: As directed    Increase activity slowly   Complete by: As directed      Allergies as of 06/04/2019   No Known Allergies     Medication List    TAKE these medications   amLODipine 10 MG tablet Commonly known as: NORVASC Take 1 tablet (10 mg total) by mouth daily.   hydrALAZINE 25 MG tablet Commonly known as: APRESOLINE Take 1 tablet (25 mg total) by mouth 2 (two) times daily.   labetalol 100 MG tablet Commonly known as: NORMODYNE Take 1 tablet (100 mg total) by mouth 2 (two) times daily.   ondansetron 4 MG tablet Commonly known as: ZOFRAN Take 1 tablet (4 mg total) by mouth every 6 (six) hours as needed for nausea.   pantoprazole 40 MG tablet Commonly known as: PROTONIX Take 1 tablet (40 mg total) by mouth daily.       No Known Allergies  Consultations: I discussed with Dr. Bronson Ing of cardiology on phone   Procedures/Studies: Dg Chest Portable 1 View  Result Date: 06/03/2019 CLINICAL DATA:  uncontrolled HTN EXAM: PORTABLE CHEST 1 VIEW COMPARISON:  None. FINDINGS: The mediastinal contours are within normal limits for AP technique. Cardiomegaly. The lungs are clear. No pneumothorax or large pleural effusion. No acute finding in the upper abdomen. IMPRESSION: Cardiomegaly without evidence of pulmonary edema or infection. Electronically Signed   By: Audie Pinto M.D.   On: 06/03/2019 18:35     Subjective: Patient does not have chest pain, shortness of breath, nausea, vomiting, diarrhea, abdominal pain.  No fever or chills.  Discharge Exam: Vitals:   06/04/19 1155 06/04/19 1400  BP: (!) 198/123 (!) 161/101  Pulse:    Resp:    Temp:    SpO2:     Vitals:   06/04/19 0746 06/04/19 1126 06/04/19 1155 06/04/19 1400  BP: (!)  193/126 (!) 209/134 (!) 198/123 (!) 161/101  Pulse: 86 90    Resp: 20 20    Temp: 98.3 F (36.8 C) 98.6 F (37 C)    TempSrc: Oral Oral    SpO2: 100% 100%    Weight:      Height:        General: Pt is alert, awake, not in acute distress Cardiovascular: RRR, S1/S2 +, no rubs, no gallops Respiratory: CTA bilaterally, no wheezing, no rhonchi Abdominal: Soft, NT, ND, bowel sounds + Extremities: no edema, no cyanosis    The results of significant diagnostics from this hospitalization (including imaging, microbiology, ancillary and laboratory) are listed below for reference.     Microbiology: Recent Results (from the past 240 hour(s))  SARS CORONAVIRUS 2 (TAT 6-24 HRS) Nasopharyngeal Nasopharyngeal Swab     Status: None   Collection Time: 06/03/19  6:06 PM   Specimen: Nasopharyngeal Swab  Result Value Ref Range  Status   SARS Coronavirus 2 NEGATIVE NEGATIVE Final    Comment: (NOTE) SARS-CoV-2 target nucleic acids are NOT DETECTED. The SARS-CoV-2 RNA is generally detectable in upper and lower respiratory specimens during the acute phase of infection. Negative results do not preclude SARS-CoV-2 infection, do not rule out co-infections with other pathogens, and should not be used as the sole basis for treatment or other patient management decisions. Negative results must be combined with clinical observations, patient history, and epidemiological information. The expected result is Negative. Fact Sheet for Patients: SugarRoll.be Fact Sheet for Healthcare Providers: https://www.woods-mathews.com/ This test is not yet approved or cleared by the [place] FDA and  has been authorized for detection and/or diagnosis of SARS-CoV-2 by FDA under an Emergency Use Authorization (EUA). This EUA will remain  in effect (meaning this test can be used) for the duration of the COVID-19 declaration under Section 56 4(b)(1) of the Act, 21  U.S.C. section 360bbb-3(b)(1), unless the authorization is terminated or revoked sooner. Performed at [facility], [place] [address] [place], [place] [address]      Labs: BNP (last 3 results) No results for input(s): BNP in the last 8760 hours. Basic Metabolic Panel: Recent Labs  Lab 06/01/19 1456 06/03/19 1657 06/03/19 1845 06/04/19 0033 06/04/19 0946  NA 139 135  --  136 136  K 3.1* 2.4*  --  2.8* 3.1*  CL 96 94*  --  96* 97*  CO2 32 27  --  29 26  GLUCOSE 94 150*  --  147* 216*  BUN 26* 24*  --  21* 18  CREATININE 2.39* 2.41*  --  2.20* 2.30*  CALCIUM 9.4 9.0  --  9.1 9.1  MG  --   --  2.1  --  2.5*   Liver Function Tests: Recent Labs  Lab 06/03/19 1657 06/04/19 0033  AST 24 23  ALT 25 24  ALKPHOS 75 69  BILITOT 0.3 0.6  PROT 7.5 7.3  ALBUMIN 4.2 3.8   No results for input(s): LIPASE, AMYLASE in the last 168 hours. No results for input(s): AMMONIA in the last 168 hours. CBC: Recent Labs  Lab 06/01/19 1456 06/03/19 1657 06/04/19 0033  WBC 5.4 5.3 5.7  NEUTROABS 3.6 3.6 4.5  HGB 13.7 13.5 13.4  HCT 41.2 40.8 38.8*  MCV 81.9 82.8 81.0  PLT 304.0 302 271   Cardiac Enzymes: No results for input(s): CKTOTAL, CKMB, CKMBINDEX, TROPONINI in the last 168 hours. BNP: Invalid input(s): POCBNP CBG: No results for input(s): GLUCAP in the last 168 hours. D-Dimer No results for input(s): DDIMER in the last 72 hours. Hgb A1c Recent Labs    06/04/19 0033  HGBA1C 6.3*   Lipid Profile No results for input(s): CHOL, HDL, LDLCALC, TRIG, CHOLHDL, LDLDIRECT in the last 72 hours. Thyroid function studies Recent Labs    06/04/19 0033  TSH 2.362   Anemia work up No results for input(s): VITAMINB12, FOLATE, FERRITIN, TIBC, IRON, RETICCTPCT in the last 72 hours. Urinalysis No results found for: COLORURINE, APPEARANCEUR, [place], [place], [place], [place], [place], [place], PROTEINUR, UROBILINOGEN, NITRITE, LEUKOCYTESUR Sepsis Labs Invalid input(s):  PROCALCITONIN,  WBC,  LACTICIDVEN Microbiology Recent Results (from the past 240 hour(s))  SARS CORONAVIRUS 2 (TAT 6-24 HRS) Nasopharyngeal Nasopharyngeal Swab     Status: None   Collection Time: 06/03/19  6:06 PM   Specimen: Nasopharyngeal Swab  Result Value Ref Range Status   SARS Coronavirus 2 NEGATIVE NEGATIVE Final    Comment: (NOTE) SARS-CoV-2 target nucleic  acids are NOT DETECTED. The SARS-CoV-2 RNA is generally detectable in upper and lower respiratory specimens during the acute phase of infection. Negative results do not preclude SARS-CoV-2 infection, do not rule out co-infections with other pathogens, and should not be used as the sole basis for treatment or other patient management decisions. Negative results must be combined with clinical observations, patient history, and epidemiological information. The expected result is Negative. Fact Sheet for Patients: SugarRoll.be Fact Sheet for Healthcare Providers: https://www.woods-mathews.com/ This test is not yet approved or cleared by the [place] FDA and  has been authorized for detection and/or diagnosis of SARS-CoV-2 by FDA under an Emergency Use Authorization (EUA). This EUA will remain  in effect (meaning this test can be used) for the duration of the COVID-19 declaration under Section 56 4(b)(1) of the Act, 21 U.S.C. section 360bbb-3(b)(1), unless the authorization is terminated or revoked sooner. Performed at [facility], [place] [address] [place], [place] [address]     Time coordinating discharge: 35 minutes  SIGNED:  Ivor Costa, DO [facility] 06/05/2019, 7:02 PM Pager is on [place]  If 7PM-7AM, please contact night-coverage [email or web address] Password [email or web address]

## 2019-06-04 NOTE — Progress Notes (Signed)
   Vital Signs MEWS/VS Documentation      06/04/2019 0700 06/04/2019 0746 06/04/2019 0800 06/04/2019 1126   MEWS Score:  0  0  0  2   MEWS Score Color:  Green  Green  Green  Yellow   Resp:  -  20  -  20   Pulse:  -  86  -  90   BP:  -  (!) 193/126  -  (!) 209/134   Temp:  -  98.3 F (36.8 C)  -  98.6 F (37 C)   O2 Device:  -  Room Air  -  Room Air   Level of Consciousness:  -  -  Alert  -     Patient with elevated blood pressure.  PRN labetolol given.      Abimbola Aki 06/04/2019,11:58 AM

## 2019-06-04 NOTE — H&P (Signed)
History and Physical    Paul Rivers [id number] DOB: 1981/03/13 DOA: 06/03/2019  PCP: [place]ique, Betty G, MD  Patient coming from: Home.  Chief Complaint: Elevated blood pressure.  HPI: Paul Rivers is a 38 y.o. male with history of hypertension and probably chronic kidney disease stage III and hyperglycemia was referred to cardiologist for uncontrolled hypertension.  Patient blood pressure was found to be markedly elevated and patient was referred to the ER.  Patient denies any headache visual symptoms chest pain or shortness of breath.  Has been on amlodipine which has been recently increased dose to 10 mg.  Patient has chronic hypokalemia.  ED Course: In the ER EKG shows sinus tachycardia with nonspecific ST-T changes.  Labs show potassium 2.4 blood glucose 150 creatinine 2.4 which has been mildly increased from July 2020.  High-sensitivity troponin was 46 and 54.  Hemoglobin 13.4 platelets 302 COVID-19 test was negative.  Patient blood pressure was markedly elevated at presentation with systolic more than [id number].  Was given IV labetalol his home dose of amlodipine and labetalol 200 mg p.o. twice daily was started.  At the time of my exam patient is asymptomatic.  Review of Systems: As per HPI, rest all negative.   Past Medical History:  Diagnosis Date  . Diabetes mellitus without complication ([place])   . Hyperlipidemia   . Hypertension     History reviewed. No pertinent surgical history.   reports that he has never smoked. He has never used smokeless tobacco. He reports current alcohol use. He reports that he does not use drugs.  No Known Allergies  Family History  Problem Relation Age of Onset  . Diabetes Mother   . Hypertension Mother   . Diabetes Father   . Hypertension Father   . Hypertension Maternal Grandmother     Prior to Admission medications   Medication Sig Start Date End Date Taking? Authorizing Provider  amLODipine (NORVASC) 10 MG tablet Take 1 tablet (10  mg total) by mouth daily. 06/01/19  Yes Martinique, Betty G, MD  pantoprazole (PROTONIX) 40 MG tablet Take 1 tablet (40 mg total) by mouth daily. 05/18/19  Yes Burchette, Alinda Sierras, MD    Physical Exam: Constitutional: Moderately built and nourished. Vitals:   06/03/19 2227 06/03/19 2230 06/03/19 2300 06/03/19 2321  BP: (!) 158/101 (!) 164/104  (!) 199/127  Pulse: 88 86  94  Resp: (!) 21 17  16   Temp:    98.8 F (37.1 C)  TempSrc:    Oral  SpO2: 96% 98%  100%  Weight:   93.2 kg   Height:   5\' 3"  (1.6 m)    Eyes: Anicteric no pallor. ENMT: No discharge from the ears eyes nose or mouth. Neck: No mass or.  No neck rigidity. Respiratory: No rhonchi or crepitations. Cardiovascular: S1-S2 heard. Abdomen: Soft nontender bowel sound present. Musculoskeletal: No edema.  No joint effusion. Skin: No rash. Neurologic: Alert awake oriented to time place and person.  Moves all extremities. Psychiatric: Appears normal per normal affect.   Labs on Admission: I have personally reviewed following labs and imaging studies  CBC: Recent Labs  Lab 06/01/19 1456 06/03/19 1657  WBC 5.4 5.3  NEUTROABS 3.6 3.6  HGB 13.7 13.5  HCT 41.2 40.8  MCV 81.9 82.8  PLT 304.0 99991111   Basic Metabolic Panel: Recent Labs  Lab 06/01/19 1456 06/03/19 1657 06/03/19 1845  NA 139 135  --   K 3.1* 2.4*  --   CL  96 94*  --   CO2 32 27  --   GLUCOSE 94 150*  --   BUN 26* 24*  --   CREATININE 2.39* 2.41*  --   CALCIUM 9.4 9.0  --   MG  --   --  2.1   GFR: Estimated Creatinine Clearance: 42.4 mL/min (A) (by C-G formula based on SCr of 2.41 mg/dL (H)). Liver Function Tests: Recent Labs  Lab 06/03/19 1657  AST 24  ALT 25  ALKPHOS 75  BILITOT 0.3  PROT 7.5  ALBUMIN 4.2   No results for input(s): LIPASE, AMYLASE in the last 168 hours. No results for input(s): AMMONIA in the last 168 hours. Coagulation Profile: No results for input(s): INR, PROTIME in the last 168 hours. Cardiac Enzymes: No results for  input(s): CKTOTAL, CKMB, CKMBINDEX, TROPONINI in the last 168 hours. BNP (last 3 results) No results for input(s): PROBNP in the last 8760 hours. HbA1C: No results for input(s): HGBA1C in the last 72 hours. CBG: No results for input(s): GLUCAP in the last 168 hours. Lipid Profile: No results for input(s): CHOL, HDL, LDLCALC, TRIG, CHOLHDL, LDLDIRECT in the last 72 hours. Thyroid Function Tests: Recent Labs    06/01/19 1456  TSH 1.58   Anemia Panel: No results for input(s): VITAMINB12, FOLATE, FERRITIN, TIBC, IRON, RETICCTPCT in the last 72 hours. Urine analysis: No results found for: COLORURINE, APPEARANCEUR, LABSPEC, PHURINE, GLUCOSEU, HGBUR, BILIRUBINUR, KETONESUR, PROTEINUR, UROBILINOGEN, NITRITE, LEUKOCYTESUR Sepsis Labs: @LABRCNTIP (procalcitonin:4,lacticidven:4) ) Recent Results (from the past 240 hour(s))  SARS CORONAVIRUS 2 (TAT 6-24 HRS) Nasopharyngeal Nasopharyngeal Swab     Status: None   Collection Time: 06/03/19  6:06 PM   Specimen: Nasopharyngeal Swab  Result Value Ref Range Status   SARS Coronavirus 2 NEGATIVE NEGATIVE Final    Comment: (NOTE) SARS-CoV-2 target nucleic acids are NOT DETECTED. The SARS-CoV-2 RNA is generally detectable in upper and lower respiratory specimens during the acute phase of infection. Negative results do not preclude SARS-CoV-2 infection, do not rule out co-infections with other pathogens, and should not be used as the sole basis for treatment or other patient management decisions. Negative results must be combined with clinical observations, patient history, and epidemiological information. The expected result is Negative. Fact Sheet for Patients: SugarRoll.be Fact Sheet for Healthcare Providers: https://www.woods-mathews.com/ This test is not yet approved or cleared by the [place] FDA and  has been authorized for detection and/or diagnosis of SARS-CoV-2 by FDA under an Emergency Use  Authorization (EUA). This EUA will remain  in effect (meaning this test can be used) for the duration of the COVID-19 declaration under Section 56 4(b)(1) of the Act, 21 U.S.C. section 360bbb-3(b)(1), unless the authorization is terminated or revoked sooner. Performed at [facility], [place] [address] [place], [place] [address]      Radiological Exams on Admission: Dg Chest Portable 1 View  Result Date: 06/03/2019 CLINICAL DATA:  uncontrolled HTN EXAM: PORTABLE CHEST 1 VIEW COMPARISON:  None. FINDINGS: The mediastinal contours are within normal limits for AP technique. Cardiomegaly. The lungs are clear. No pneumothorax or large pleural effusion. No acute finding in the upper abdomen. IMPRESSION: Cardiomegaly without evidence of pulmonary edema or infection. Electronically Signed   By: Audie Pinto M.D.   On: 06/03/2019 18:35    EKG: Independently reviewed.  Sinus tachycardia with nonspecific ST changes.  Assessment/Plan Principal Problem:   Hypertensive urgency Active Problems:   Hypokalemia   CKD (chronic kidney disease), stage III    1.  Hypertensive urgency -at this time patient is continued on his home dose of amlodipine 10 mg and I have added labetalol 100 mg p.o. twice daily with PRN IV labetalol for systolic more than 99991111.  Closely follow blood pressure trend.  Given the patient has hypokalemia will check urine aldosterone ratio and also check plasma metanephrines. 2. Hyperkalemia has been chronic will check renin aldosterone ratio.  Replace potassium recheck metabolic panel. 3. Chronic kidney disease likely stage III at this time -check urinalysis follow metabolic panel closely. 4. Hyperglycemia check hemoglobin A1c.   DVT prophylaxis: SCDs for now until blood pressure is controlled. Code Status: Full code. Family Communication: Discussed with patient. Disposition Plan: Home. Consults called: None. Admission status: Observation.   Rise Patience MD  [facility] Pager [phone].  If 7PM-7AM, please contact night-coverage [email or web address] Password [email or web address]  06/04/2019, 12:19 AM

## 2019-06-04 NOTE — Discharge Instructions (Signed)
Hypertension, Adult Hypertension is another name for high blood pressure. High blood pressure forces your heart to work harder to pump blood. This can cause problems over time. There are two numbers in a blood pressure reading. There is a top number (systolic) over a bottom number (diastolic). It is best to have a blood pressure that is below 120/80. Healthy choices can help lower your blood pressure, or you may need medicine to help lower it. What are the causes? The cause of this condition is not known. Some conditions may be related to high blood pressure. What increases the risk?  Smoking.  Having type 2 diabetes mellitus, high cholesterol, or both.  Not getting enough exercise or physical activity.  Being overweight.  Having too much fat, sugar, calories, or salt (sodium) in your diet.  Drinking too much alcohol.  Having long-term (chronic) kidney disease.  Having a family history of high blood pressure.  Age. Risk increases with age.  Race. You may be at higher risk if you are African American.  Gender. Men are at higher risk than women before age 68. After age 33, women are at higher risk than men.  Having obstructive sleep apnea.  Stress. What are the signs or symptoms?  High blood pressure may not cause symptoms. Very high blood pressure (hypertensive crisis) may cause: ? Headache. ? Feelings of worry or nervousness (anxiety). ? Shortness of breath. ? Nosebleed. ? A feeling of being sick to your stomach (nausea). ? Throwing up (vomiting). ? Changes in how you see. ? Very bad chest pain. ? Seizures. How is this treated?  This condition is treated by making healthy lifestyle changes, such as: ? Eating healthy foods. ? Exercising more. ? Drinking less alcohol.  Your health care provider may prescribe medicine if lifestyle changes are not enough to get your blood pressure under control, and if: ? Your top number is above 130. ? Your bottom number is above  80.  Your personal target blood pressure may vary. Follow these instructions at home: Eating and drinking   If told, follow the DASH eating plan. To follow this plan: ? Fill one half of your plate at each meal with fruits and vegetables. ? Fill one fourth of your plate at each meal with whole grains. Whole grains include whole-wheat pasta, brown rice, and whole-grain bread. ? Eat or drink low-fat dairy products, such as skim milk or low-fat yogurt. ? Fill one fourth of your plate at each meal with low-fat (lean) proteins. Low-fat proteins include fish, chicken without skin, eggs, beans, and tofu. ? Avoid fatty meat, cured and processed meat, or chicken with skin. ? Avoid pre-made or processed food.  Eat less than 1,500 mg of salt each day.  Do not drink alcohol if: ? Your doctor tells you not to drink. ? You are pregnant, may be pregnant, or are planning to become pregnant.  If you drink alcohol: ? Limit how much you use to:  0-1 drink a day for women.  0-2 drinks a day for men. ? Be aware of how much alcohol is in your drink. In the [place] one drink equals one 12 oz bottle of beer (355 mL), one 5 oz glass of wine (148 mL), or one 1 oz glass of hard liquor (44 mL). Lifestyle   Work with your doctor to stay at a healthy weight or to lose weight. Ask your doctor what the best weight is for you.  Get at least 30 minutes of exercise  most days of the week. This may include walking, swimming, or biking.  Get at least 30 minutes of exercise that strengthens your muscles (resistance exercise) at least 3 days a week. This may include lifting weights or doing Pilates.  Do not use any products that contain nicotine or tobacco, such as cigarettes, e-cigarettes, and chewing tobacco. If you need help quitting, ask your doctor.  Check your blood pressure at home as told by your doctor.  Keep all follow-up visits as told by your doctor. This is important. Medicines  Take over-the-counter  and prescription medicines only as told by your doctor. Follow directions carefully.  Do not skip doses of blood pressure medicine. The medicine does not work as well if you skip doses. Skipping doses also puts you at risk for problems.  Ask your doctor about side effects or reactions to medicines that you should watch for. Contact a doctor if you:  Think you are having a reaction to the medicine you are taking.  Have headaches that keep coming back (recurring).  Feel dizzy.  Have swelling in your ankles.  Have trouble with your vision. Get help right away if you:  Get a very bad headache.  Start to feel mixed up (confused).  Feel weak or numb.  Feel faint.  Have very bad pain in your: ? Chest. ? Belly (abdomen).  Throw up more than once.  Have trouble breathing. Summary  Hypertension is another name for high blood pressure.  High blood pressure forces your heart to work harder to pump blood.  For most people, a normal blood pressure is less than 120/80.  Making healthy choices can help lower blood pressure. If your blood pressure does not get lower with healthy choices, you may need to take medicine. This information is not intended to replace advice given to you by your health care provider. Make sure you discuss any questions you have with your health care provider. Document Released: 01/28/2008 Document Revised: 04/21/2018 Document Reviewed: 04/21/2018 Elsevier Patient Education  2020 [place] were cared for by a hospitalist during your hospital stay. If you have any questions about your discharge medications or the care you received while you were in the hospital after you are discharged, you can call the unit and ask to speak with the hospitalist on call if the hospitalist that took care of you is not available. Once you are discharged, your primary care physician will handle any further medical issues. Please note that NO REFILLS for any discharge  medications will be authorized once you are discharged, as it is imperative that you return to your primary care physician (or establish a relationship with a primary care physician if you do not have one) for your aftercare needs so that they can reassess your need for medications and monitor your lab values.  Follow up with PCP in 7-10 day.Take all medications as prescribed. If symptoms change or worsen please return to the ED for evaluation .

## 2019-06-04 NOTE — Progress Notes (Signed)
Initial Nutrition Assessment  DOCUMENTATION CODES:   Obesity unspecified  INTERVENTION:    Ensure MAX Protein po once daily, each supplement provides 150 kcal and 30 grams of protein  MVI daily   NUTRITION DIAGNOSIS:   Increased nutrient needs related to acute illness as evidenced by estimated needs.  GOAL:   Patient will meet greater than or equal to 90% of their needs  MONITOR:   Supplement acceptance, Weight trends, PO intake, Labs, I & O's  REASON FOR ASSESSMENT:   Malnutrition Screening Tool    ASSESSMENT:   Patient with PMH significant for DM, HLD, CKD III, and HTN. Presents this admission with hypertensive urgency.   RD working remotely.  Spoke with pt via phone. Denies having loss of appetite PTA. Typically eats 2-3 meals that consist of a sandwich for lunch and meat with vegetable for dinner. Recently starting having increased heartburn which did not affect his appetite. Meal completions charted as 100% since admit. Discussed the importance of protein intake for preservation of lean body mass. Pt amendable to Ensure MAX.   Pt endorses a UBW of 215 lb and an unintentional wt loss of 15 lb in the last month (unsure why he lost weight). Records indicate pt weighed 209 lb on 9/23 and 204 lb this admission (insignficiant for time frame).   I/O: +340 ml since admit  Drips: Mg sulfate Labs: K 2.8 (L) Cr 2.20- trending down  Diet Order:   Diet Order            Diet Heart Room service appropriate? Yes; Fluid consistency: Thin  Diet effective now              EDUCATION NEEDS:   Education needs have been addressed  Skin:  Skin Assessment: Reviewed RN Assessment  Last BM:  PTA  Height:   Ht Readings from Last 1 Encounters:  06/03/19 5\' 3"  (1.6 m)    Weight:   Wt Readings from Last 1 Encounters:  06/04/19 92.7 kg    Ideal Body Weight:  56.4 kg  BMI:  Body mass index is 36.21 kg/m.  Estimated Nutritional Needs:   Kcal:  1800-2000  kcal  Protein:  90-105 grams  Fluid:  >/= 1.8 L/day   Mariana Single RD, LDN Clinical Nutrition Pager # - [phone]

## 2019-06-04 NOTE — Progress Notes (Signed)
Discharge instructions given with stated understanding 

## 2019-06-04 NOTE — Plan of Care (Signed)

## 2019-06-05 ENCOUNTER — Other Ambulatory Visit: Payer: Self-pay | Admitting: Cardiovascular Disease

## 2019-06-05 DIAGNOSIS — R011 Cardiac murmur, unspecified: Secondary | ICD-10-CM

## 2019-06-05 DIAGNOSIS — R778 Other specified abnormalities of plasma proteins: Secondary | ICD-10-CM

## 2019-06-05 NOTE — Progress Notes (Signed)
Paul Rivers will need an echocardiogram. This was not done in hospital. Ordered placed. Indication is malignant hypertension.   Evalina Field, MD

## 2019-06-06 ENCOUNTER — Telehealth: Payer: Self-pay | Admitting: Cardiovascular Disease

## 2019-06-06 MED ORDER — CARVEDILOL 25 MG PO TABS: 25.0000 mg | ORAL_TABLET | Freq: Two times a day (BID) | ORAL | 3 refills | Status: DC

## 2019-06-06 NOTE — Telephone Encounter (Signed)
Called Mr. Paul Rivers. Doing well since discharge. We will get outpatient echocardiogram. I also switched his labetalol to coreg 25 mg BID. Next will be to titrate up hydralazine. Labs suspicious for hyperaldo, awaiting aldo/renin levels and metanephrines to rule out pheo.   Evalina Field, MD

## 2019-06-07 LAB — ALDOSTERONE + RENIN ACTIVITY W/ RATIO
ALDO / PRA Ratio: 2.8 Ratio (ref 0.9–28.9)
Aldosterone: 30 ng/dL
Renin Activity: 10.57 ng/mL/h — ABNORMAL HIGH (ref 0.25–5.82)

## 2019-06-07 NOTE — Addendum Note (Signed)
Addended by: Zebedee Iba on: 06/07/2019 04:29 PM   Modules accepted: Orders

## 2019-06-13 ENCOUNTER — Other Ambulatory Visit: Payer: Self-pay

## 2019-06-13 ENCOUNTER — Encounter: Payer: Self-pay | Admitting: Family Medicine

## 2019-06-13 ENCOUNTER — Ambulatory Visit (INDEPENDENT_AMBULATORY_CARE_PROVIDER_SITE_OTHER): Payer: Commercial Managed Care - PPO | Admitting: Family Medicine

## 2019-06-13 VITALS — BP 170/100 | HR 86 | Temp 97.8°F | Ht 63.0 in | Wt 213.0 lb

## 2019-06-13 DIAGNOSIS — N1832 Chronic kidney disease, stage 3b: Secondary | ICD-10-CM | POA: Diagnosis not present

## 2019-06-13 DIAGNOSIS — E1169 Type 2 diabetes mellitus with other specified complication: Secondary | ICD-10-CM | POA: Diagnosis not present

## 2019-06-13 DIAGNOSIS — R944 Abnormal results of kidney function studies: Secondary | ICD-10-CM

## 2019-06-13 DIAGNOSIS — E876 Hypokalemia: Secondary | ICD-10-CM

## 2019-06-13 DIAGNOSIS — I161 Hypertensive emergency: Secondary | ICD-10-CM | POA: Diagnosis not present

## 2019-06-13 DIAGNOSIS — E785 Hyperlipidemia, unspecified: Secondary | ICD-10-CM

## 2019-06-13 DIAGNOSIS — E118 Type 2 diabetes mellitus with unspecified complications: Secondary | ICD-10-CM

## 2019-06-13 LAB — BASIC METABOLIC PANEL
BUN: 29 mg/dL — ABNORMAL HIGH (ref 6–23)
CO2: 29 mEq/L (ref 19–32)
Calcium: 9 mg/dL (ref 8.4–10.5)
Chloride: 100 mEq/L (ref 96–112)
Creatinine, Ser: 2.36 mg/dL — ABNORMAL HIGH (ref 0.40–1.50)
GFR: 31.2 mL/min — ABNORMAL LOW (ref >60.00)
Glucose, Bld: 127 mg/dL — ABNORMAL HIGH (ref 70–99)
Potassium: 3.5 mEq/L (ref 3.5–5.1)
Sodium: 138 mEq/L (ref 135–145)

## 2019-06-13 MED ORDER — HYDRALAZINE HCL 50 MG PO TABS: 50.0000 mg | ORAL_TABLET | Freq: Three times a day (TID) | ORAL | 2 refills | Status: DC

## 2019-06-13 NOTE — Patient Instructions (Addendum)
A few things to remember from today's visit:   Abnormal renal function test - Plan: CT ADRENAL ABDOMEN WO CONTRAST  Stage 3b chronic kidney disease - Plan: CT ADRENAL ABDOMEN WO CONTRAST  Hyperlipidemia associated with type 2 diabetes mellitus ([place])  Hypertensive emergency - Plan: Basic metabolic panel, hydrALAZINE (APRESOLINE) 50 MG tablet, CT ADRENAL ABDOMEN WO CONTRAST  Hypokalemia  Today hydralazine was increased from 25 mg twice daily to 50 mg 3 times per day. No changes in carvedilol or amlodipine. Continue monitoring your blood pressure periodically. I will see you back in 3 to 4 weeks.  Please be sure medication list is accurate. If a new problem present, please set up appointment sooner than planned today.

## 2019-06-13 NOTE — Progress Notes (Signed)
HPI:   Paul DAQWON Rivers is a 38 y.o. male, who is here today for chronic disease management.  He was last evaluated on 06/01/19 for severe HTN. Started on Benazepril and Amlodipine dose increased from 5 mg to 10 mg. He was evaluated by cardiologist on 06/03/19, he was sent to the ER because BP was elevated at 200/100. SBP upon initial evaluation was elevated at 240.  He was treated with IV Labetalol  Denies severe/frequent headache, visual changes, chest pain, dyspnea, palpitation, claudication, focal weakness, or edema.  K+ was low at 2.4.  Lab Results  Component Value Date   ALT 24 06/04/2019   AST 23 06/04/2019   ALKPHOS 69 06/04/2019   BILITOT 0.6 06/04/2019   Troponin elevated at 46,54,52,and 42.   He was discharged on Hydralazine 25 mg bid,Amlodipine 10 mg,and Labetalol 200 mg bid.  He has checked BP at home and BP readings are "better", 100-160/100-116.   Lab Results  Component Value Date   CHOL 204 (H) 02/28/2019   HDL 41.50 02/28/2019   LDLCALC 143 (H) 02/28/2019   TRIG 94.0 02/28/2019   CHOLHDL 5 02/28/2019   Component     Latest Ref Rng & Units 06/04/2019         9:46 AM  Sodium     135 - 145 mEq/L 136  Potassium     3.5 - 5.1 mEq/L 3.1 (L)  Chloride     96 - 112 mEq/L 97 (L)  CO2     19 - 32 mEq/L 26  Glucose     70 - 99 mg/dL 216 (H)  BUN     6 - 23 mg/dL 18  Creatinine     0.40 - 1.50 mg/dL 2.30 (H)  Calcium     8.4 - 10.5 mg/dL 9.1  GFR, Est Non African American     >60 mL/min 35 (L)  GFR, Est African American     >60 mL/min 41 (L)  Anion gap     5 - 15 13   DM II, Dx'ed 2 years ago. He is on non pharmacologic treatment. He does not check BS's. Denies abdominal pain, nausea,vomiting, polydipsia,polyuria, or polyphagia.  Plasma normetanephrine elevated at 179.3 Renin elevated at 10.57 on 06/01/19. Aldosterone 19.4 (30) and aldo/reini ratio 2.8 (3.4).  Review of Systems  Constitutional: Negative for activity change,  appetite change, fatigue, fever and unexpected weight change.  HENT: Negative for nosebleeds, sore throat and trouble swallowing.   Eyes: Negative for redness and visual disturbance.  Respiratory: Negative for apnea, cough, shortness of breath and wheezing.   Cardiovascular: Negative for chest pain, palpitations and leg swelling.  Gastrointestinal: Negative for abdominal pain, nausea and vomiting.  Genitourinary: Negative for decreased urine volume, dysuria and hematuria.  Neurological: Negative for dizziness, seizures, weakness, numbness and headaches.   Rest see pertinent positives and negatives per HPI.   Current Outpatient Medications on File Prior to Visit  Medication Sig Dispense Refill  . amLODipine (NORVASC) 10 MG tablet Take 1 tablet (10 mg total) by mouth daily. 90 tablet 1  . carvedilol (COREG) 25 MG tablet Take 1 tablet (25 mg total) by mouth 2 (two) times daily. 180 tablet 3  . ondansetron (ZOFRAN) 4 MG tablet Take 1 tablet (4 mg total) by mouth every 6 (six) hours as needed for nausea. 20 tablet 0  . pantoprazole (PROTONIX) 40 MG tablet Take 1 tablet (40 mg total) by mouth daily. 30 tablet 3  No current facility-administered medications on file prior to visit.      Past Medical History:  Diagnosis Date  . Diabetes mellitus without complication ([place])   . Hyperlipidemia   . Hypertension    No Known Allergies  Social History   Socioeconomic History  . Marital status: Married    Spouse name: Not on file  . Number of children: 3  . Years of education: 63  . Highest education level: Not on file  Occupational History  . Occupation: Security  Social Needs  . Financial resource strain: Not on file  . Food insecurity    Worry: Not on file    Inability: Not on file  . Transportation needs    Medical: Not on file    Non-medical: Not on file  Tobacco Use  . Smoking status: Never Smoker  . Smokeless tobacco: Never Used  Substance and Sexual Activity  . Alcohol  use: Yes    Comment: Rarely  . Drug use: No  . Sexual activity: Not on file  Lifestyle  . Physical activity    Days per week: Not on file    Minutes per session: Not on file  . Stress: Not on file  Relationships  . Social Herbalist on phone: Not on file    Gets together: Not on file    Attends religious service: Not on file    Active member of club or organization: Not on file    Attends meetings of clubs or organizations: Not on file    Relationship status: Not on file  Other Topics Concern  . Not on file  Social History Narrative   Fun/hobby: Keeping up with children (6, 4, 14)    Vitals:   06/13/19 0823  BP: (!) 170/100  Pulse: 86  Temp: 97.8 F (36.6 C)  SpO2: 96%   Body mass index is 37.73 kg/m.    Physical Exam  Nursing note reviewed. Constitutional: He is oriented to person, place, and time. He appears well-developed. No distress.  HENT:  Head: Normocephalic and atraumatic.  Mouth/Throat: Oropharynx is clear and moist and mucous membranes are normal.  Eyes: Pupils are equal, round, and reactive to light. Conjunctivae are normal.  Cardiovascular: Normal rate and regular rhythm.  Murmur heard. Respiratory: Effort normal and breath sounds normal. No respiratory distress.  GI: Soft. He exhibits no mass. There is no hepatomegaly. There is no abdominal tenderness.  Musculoskeletal:        General: No edema.  Lymphadenopathy:    He has no cervical adenopathy.  Neurological: He is alert and oriented to person, place, and time. He has normal strength. No cranial nerve deficit. Gait normal.  Skin: Skin is warm. No rash noted. No erythema.  Psychiatric: He has a normal mood and affect. Cognition and memory are normal.  Well groomed, good eye contact.    ASSESSMENT AND PLAN:  Paul Rivers was seen today for follow-up.  Diagnoses and all orders for this visit: Orders Placed This Encounter  Procedures  . CT ADRENAL ABDOMEN WO CONTRAST  . Basic  metabolic panel   Lab Results  Component Value Date   CREATININE 2.36 (H) 06/13/2019   BUN 29 (H) 06/13/2019   NA 138 06/13/2019   K 3.5 06/13/2019   CL 100 06/13/2019   CO2 29 06/13/2019    Hypertensive emergency Poorly controlled but improved. Asymptomatic. ? Secondary HT vs essential benign.  Hydralazine increased from 25 mg bid to 50 mg tid. No  changes in Labetalol or Amlodipine. Continue monitoring BP at home. Low salt diet recommended. Keep appt with cardiologist.  -     Basic metabolic panel -     hydrALAZINE (APRESOLINE) 50 MG tablet; Take 1 tablet (50 mg total) by mouth 3 (three) times daily. -     CT ADRENAL ABDOMEN WO CONTRAST; Future  Stage 3b chronic kidney disease Pending renal Duplex US. Adequate hydration,low salt diet,and avoidance of NSAID's. BP needs to be better controlled. Will wait for BMP result and /or renal US and then we will discussed the need for nephrology referral at this time.  -     [place]; Future  Abnormal renal function test Adrenal CT will be arranged.  -     CT ADRENAL ABDOMEN WO CONTRAST; Future  Hyperlipidemia associated with type 2 diabetes mellitus ([place]) For now I am recommending low fat diet. Because DM II he benefit from statin meds but prefer to hold on new medications.  Hypokalemia K+ rich diet for now. Further recommendations will be given according to lab results.  Type II diabetes mellitus with complication ([facility]) Non pharmacologic treatment recommended for now. Eye exam needs to be arranged.    Return in about 4 weeks (around 07/11/2019).    -Mr. [name] was advised to return sooner than planned today if new concerns arise.        G. Martinique, MD  [facility]. [place] office.

## 2019-06-14 ENCOUNTER — Ambulatory Visit (HOSPITAL_COMMUNITY): Payer: Commercial Managed Care - PPO | Attending: Cardiovascular Disease

## 2019-06-14 DIAGNOSIS — R011 Cardiac murmur, unspecified: Secondary | ICD-10-CM | POA: Diagnosis not present

## 2019-06-14 NOTE — Progress Notes (Signed)
Cardiology Office Note:   Date:  06/15/2019  NAME:  Paul Rivers    MRN: [id number] DOB:  Nov 25, 1980   PCP:  Martinique, Betty G, MD  Cardiologist:  No primary care provider on file.  Electrophysiologist:  None   Referring MD: Martinique, Betty G, MD   Chief Complaint  Patient presents with  . Hypertension    History of Present Illness:   Paul Rivers is a 38 y.o. male with a hx of hypertension who presents for follow-up of severe uncontrolled hypertension.  I saw him several weeks ago, and his blood pressure was in the 240/140 range.  I sent him to the emergency room and he was admitted to the hospital where he was started on aggressive blood pressure control medications.  His laboratory work demonstrated elevated plasma metanephrines and a normal renal and Aldo level.  His blood pressure today is around 200/120.  He was recently started on hydralazine 50 mg 3 times daily, but has not started this medication.  Other medications include amlodipine 10 mg daily and carvedilol 25 mg twice daily.  His serum creatinine is up around 2.3 with a GFR in the stage III CKD range.  His primary care physician has ordered a CT without contrast of his abdomen pelvis to look for any sort of adrenal mass.  He reports he feels well today and without symptoms.  He does report lightheadedness once he takes his blood pressure medications.  He is continue to work without any major issues.  He denies any chest pain, lightheadedness or any symptoms today.  He denies any illicit drug use and there is no readily identifiable cause of his hypertension.  He reports a strong family history of high blood pressure, but no real secondary causes that I can tell.  His lab work has demonstrated hypokalemia in the past with an elevated bicarbonate.  Overall this is concerning.  Past Medical History: Past Medical History:  Diagnosis Date  . Diabetes mellitus without complication ([place])   . Hyperlipidemia   . Hypertension      Past Surgical History: History reviewed. No pertinent surgical history.  Current Medications: Current Meds  Medication Sig  . amLODipine (NORVASC) 10 MG tablet Take 1 tablet (10 mg total) by mouth daily.  . carvedilol (COREG) 25 MG tablet Take 1 tablet (25 mg total) by mouth 2 (two) times daily.  . hydrALAZINE (APRESOLINE) 50 MG tablet Take 1 tablet (50 mg total) by mouth 3 (three) times daily.  . ondansetron (ZOFRAN) 4 MG tablet Take 1 tablet (4 mg total) by mouth every 6 (six) hours as needed for nausea.  . pantoprazole (PROTONIX) 40 MG tablet Take 1 tablet (40 mg total) by mouth daily.     Allergies:    Patient has no known allergies.   Social History: Social History   Socioeconomic History  . Marital status: Married    Spouse name: Not on file  . Number of children: 3  . Years of education: 104  . Highest education level: Not on file  Occupational History  . Occupation: Security  Social Needs  . Financial resource strain: Not on file  . Food insecurity    Worry: Not on file    Inability: Not on file  . Transportation needs    Medical: Not on file    Non-medical: Not on file  Tobacco Use  . Smoking status: Never Smoker  . Smokeless tobacco: Never Used  Substance and Sexual Activity  . Alcohol  use: Yes    Comment: Rarely  . Drug use: No  . Sexual activity: Not on file  Lifestyle  . Physical activity    Days per week: Not on file    Minutes per session: Not on file  . Stress: Not on file  Relationships  . Social Herbalist on phone: Not on file    Gets together: Not on file    Attends religious service: Not on file    Active member of club or organization: Not on file    Attends meetings of clubs or organizations: Not on file    Relationship status: Not on file  Other Topics Concern  . Not on file  Social History Narrative   Fun/hobby: Keeping up with children (6, 4, 14)     Family History: The patient's family history includes Diabetes in his  father and mother; Hypertension in his father, maternal grandmother, and mother.  ROS:   All other ROS reviewed and negative. Pertinent positives noted in the HPI.     EKGs/Labs/Other Studies Reviewed:   The following studies were personally reviewed by me today:  EKG:  EKG is ordered today.  The ekg ordered today demonstrates normal sinus rhythm, heart rate 85, incomplete left bundle branch block noted, LVH, and was personally reviewed by me.   TTE 06/14/2019   1. Left ventricular ejection fraction, by visual estimation, is 55 to 60%. The left ventricle has normal function. There is severely increased left ventricular hypertrophy.  2. Left ventricular diastolic Doppler parameters are consistent with pseudonormalization pattern of LV diastolic filling.  3. Elevated mean left atrial pressure.  4. Global right ventricle has normal systolic function.The right ventricular size is normal.  5. Left atrial size was mildly dilated.  6. Right atrial size was normal.  7. The mitral valve is normal in structure. No evidence of mitral valve regurgitation.  8. The tricuspid valve is normal in structure. Tricuspid valve regurgitation was not visualized by color flow Doppler.  9. The aortic valve is tricuspid Aortic valve regurgitation was not visualized by color flow Doppler. 10. The pulmonic valve was grossly normal. Pulmonic valve regurgitation is not visualized by color flow Doppler. 11. The inferior vena cava is normal in size with greater than 50% respiratory variability, suggesting right atrial pressure of 3 mmHg.  Recent Labs: 06/04/2019: ALT 24; Hemoglobin 13.4; Magnesium 2.5; Platelets 271; TSH 2.362 06/13/2019: BUN 29; Creatinine, Ser 2.36; Potassium 3.5; Sodium 138   Recent Lipid Panel    Component Value Date/Time   CHOL 204 (H) 02/28/2019 0804   TRIG 94.0 02/28/2019 0804   HDL 41.50 02/28/2019 0804   CHOLHDL 5 02/28/2019 0804   VLDL 18.8 02/28/2019 0804   LDLCALC 143 (H) 02/28/2019  0804    Physical Exam:   VS:  BP (!) 203/119 (BP Location: Left Arm)   Pulse 98   Temp 98.1 F (36.7 C)   Ht 5\' 3"  (1.6 m)   Wt 213 lb 12.8 oz (97 kg)   SpO2 98%   BMI 37.87 kg/m    Wt Readings from Last 3 Encounters:  06/15/19 213 lb 12.8 oz (97 kg)  06/13/19 213 lb (96.6 kg)  06/04/19 204 lb 6.4 oz (92.7 kg)    General: Well nourished, well developed, in no acute distress Heart: Atraumatic, normal size  Eyes: PEERLA, EOMI  Neck: Supple, no JVD Endocrine: No thryomegaly Cardiac: Normal S1, S2; RRR; no murmurs, rubs, or gallops Lungs: Clear to auscultation  bilaterally, no wheezing, rhonchi or rales  Abd: Soft, nontender, no hepatomegaly  Ext: No edema, pulses 2+ Musculoskeletal: No deformities, BUE and BLE strength normal and equal Skin: Warm and dry, no rashes   Neuro: Alert and oriented to person, place, time, and situation, CNII-XII grossly intact, no focal deficits  Psych: Normal mood and affect   ASSESSMENT:   Paul Rivers is a 38 y.o. male who presents for the following: 1. Essential hypertension   2. Stage 3b chronic kidney disease     PLAN:   1. Essential hypertension 2. Stage 3b chronic kidney disease -His blood pressure is marginally better today in the 200/120 range.  His primary care physician just ordered hydralazine 50 mg 3 times daily and he will start this today.  He will continue on carvedilol 25 mg twice daily and amlodipine 10 mg daily. -Given his CKD which is in the stage III range we will refer him urgently to nephrology for further work-up and assistance with looking for secondary causes of his blood pressure elevation.  He is making good urine, but I do wonder if this is possibly some sort of primary kidney problem. -I will order a renal artery duplex to look for any renal artery stenosis. -His plasma metanephrines were marginally elevated (normetanephrine was 179, with upper limit of normal 110 and his metanephrine free was normal).  He has no  symptoms really of a pheochromocytoma and his values are not really 3 times the upper limit of normal.  His primary care physician has ordered a Noncon CT of his abdomen pelvis to look for any adrenal mass and I think this is reasonable.  I just am unsure of how valuable this will be. -His Rein/Aldo level was 3.4 which is inconsistent with a primary aldosteronism -His renin level was elevated and this possibly suggest he has renal artery stenosis, we will obtain this duplex -I will have him come back to see our pharmacist in 1 week and if blood pressure still elevated we will have to start clonidine.  Hopefully he will get to see nephrology relatively soon to determine whether work-up needs to be done.  I really do not feel comfortable starting an ACE or an ARB in this gentleman with his kidney function the way it is.  I will see him back in 2 weeks as well.  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  Hopefully, we can figure out what is going on with him relatively soon.   Disposition: Return in about 2 weeks (around 06/29/2019).  Medication Adjustments/Labs and Tests Ordered: Current medicines are reviewed at length with the patient today.  Concerns regarding medicines are outlined above.  Orders Placed This Encounter  Procedures  . Ambulatory referral to Nephrology  . EKG 12-Lead  . VAS US RENAL ARTERY DUPLEX   No orders of the defined types were placed in this encounter.   Patient Instructions  Medication Instructions:  NO CHANGES   *If you need a refill on your cardiac medications before your next appointment, please call your pharmacy*  Lab Work: NOT NEEDED    Testing/Procedures:  WILL BE SCHEDULE AT [place] [facility] Your physician has requested that you have a renal artery duplex. During this test, an ultrasound is used to evaluate blood flow to the kidneys. Allow one hour for this exam. Do not eat after  midnight the day before and avoid carbonated beverages. Take your medications as  you usually do.   Follow-Up: At [facility], you and your health needs are our priority.  As part of our continuing mission to provide you with exceptional heart care, we have created designated Provider Care Teams.  These Care Teams include your primary Cardiologist (physician) and Advanced Practice Providers (APPs -  Physician Assistants and Nurse Practitioners) who all work together to provide you with the care you need, when you need it.  Your next appointment:   1 WEEK WITH CVRR  AND 2 WEEKS WITH DR O'NEAL  The format for your next appointment:   In Person  Provider:   Eleonore Chiquito, MD  2 WEEKS  1 WEEK WITH CVRR- BLOOD PRESSURE   Other Instructions    You have been referred to  [place], Addison Naegeli. Audie Box, [place]  [address] [place], [place] [address] [phone]  06/15/2019 4:26 PM

## 2019-06-15 ENCOUNTER — Other Ambulatory Visit: Payer: Self-pay

## 2019-06-15 ENCOUNTER — Encounter: Payer: Self-pay | Admitting: Cardiovascular Disease

## 2019-06-15 ENCOUNTER — Ambulatory Visit (INDEPENDENT_AMBULATORY_CARE_PROVIDER_SITE_OTHER): Payer: Commercial Managed Care - PPO | Admitting: Cardiovascular Disease

## 2019-06-15 VITALS — BP 203/119 | HR 98 | Temp 98.1°F | Ht 63.0 in | Wt 213.8 lb

## 2019-06-15 DIAGNOSIS — I10 Essential (primary) hypertension: Secondary | ICD-10-CM | POA: Diagnosis not present

## 2019-06-15 DIAGNOSIS — N1832 Chronic kidney disease, stage 3b: Secondary | ICD-10-CM

## 2019-06-15 NOTE — Patient Instructions (Addendum)
Medication Instructions:  NO CHANGES   *If you need a refill on your cardiac medications before your next appointment, please call your pharmacy*  Lab Work: NOT NEEDED    Testing/Procedures:  WILL BE SCHEDULE AT [place] [address] Your physician has requested that you have a renal artery duplex. During this test, an ultrasound is used to evaluate blood flow to the kidneys. Allow one hour for this exam. Do not eat after midnight the day before and avoid carbonated beverages. Take your medications as you usually do.   Follow-Up: At [facility], you and your health needs are our priority.  As part of our continuing mission to provide you with exceptional heart care, we have created designated Provider Care Teams.  These Care Teams include your primary Cardiologist (physician) and Advanced Practice Providers (APPs -  Physician Assistants and Nurse Practitioners) who all work together to provide you with the care you need, when you need it.  Your next appointment:   1 WEEK WITH CVRR  AND 2 WEEKS WITH DR O'NEAL  The format for your next appointment:   In Person  Provider:   Eleonore Chiquito, MD  2 WEEKS  1 WEEK WITH CVRR- BLOOD PRESSURE   Other Instructions    You have been referred to  [place]

## 2019-06-23 ENCOUNTER — Institutional Professional Consult (permissible substitution): Payer: Commercial Managed Care - PPO | Admitting: Internal Medicine

## 2019-06-23 ENCOUNTER — Other Ambulatory Visit: Payer: Self-pay

## 2019-06-23 ENCOUNTER — Ambulatory Visit (HOSPITAL_COMMUNITY)
Admission: RE | Admit: 2019-06-23 | Discharge: 2019-06-23 | Disposition: A | Discharge status: Home / Self Care | Payer: Commercial Managed Care - PPO | Source: Ambulatory Visit | Attending: Cardiology | Admitting: Cardiology

## 2019-06-23 DIAGNOSIS — I10 Essential (primary) hypertension: Secondary | ICD-10-CM | POA: Insufficient documentation

## 2019-06-23 DIAGNOSIS — N1832 Chronic kidney disease, stage 3b: Secondary | ICD-10-CM | POA: Insufficient documentation

## 2019-06-24 ENCOUNTER — Ambulatory Visit: Payer: Commercial Managed Care - PPO

## 2019-07-04 ENCOUNTER — Encounter: Payer: Self-pay | Admitting: Family Medicine

## 2019-07-04 ENCOUNTER — Other Ambulatory Visit: Payer: Self-pay

## 2019-07-04 ENCOUNTER — Ambulatory Visit (INDEPENDENT_AMBULATORY_CARE_PROVIDER_SITE_OTHER): Payer: Commercial Managed Care - PPO | Admitting: Family Medicine

## 2019-07-04 VITALS — BP 150/90 | HR 78 | Temp 97.1°F | Resp 16 | Ht 63.0 in | Wt 213.0 lb

## 2019-07-04 DIAGNOSIS — N1832 Chronic kidney disease, stage 3b: Secondary | ICD-10-CM

## 2019-07-04 DIAGNOSIS — E785 Hyperlipidemia, unspecified: Secondary | ICD-10-CM

## 2019-07-04 DIAGNOSIS — E1169 Type 2 diabetes mellitus with other specified complication: Secondary | ICD-10-CM | POA: Diagnosis not present

## 2019-07-04 DIAGNOSIS — I10 Essential (primary) hypertension: Secondary | ICD-10-CM

## 2019-07-04 DIAGNOSIS — Z23 Encounter for immunization: Secondary | ICD-10-CM

## 2019-07-04 DIAGNOSIS — G4730 Sleep apnea, unspecified: Secondary | ICD-10-CM

## 2019-07-04 LAB — LIPID PANEL
Cholesterol: 204 mg/dL — ABNORMAL HIGH (ref 0–200)
HDL: 43.5 mg/dL (ref >39.00)
LDL Cholesterol: 145 mg/dL — ABNORMAL HIGH (ref 0–99)
NonHDL: 160.25
Total CHOL/HDL Ratio: 5
Triglycerides: 75 mg/dL (ref 0.0–149.0)
VLDL: 15 mg/dL (ref 0.0–40.0)

## 2019-07-04 LAB — MAGNESIUM: Magnesium: 2.2 mg/dL (ref 1.5–2.5)

## 2019-07-04 LAB — BASIC METABOLIC PANEL
BUN: 26 mg/dL — ABNORMAL HIGH (ref 6–23)
CO2: 26 mEq/L (ref 19–32)
Calcium: 8.9 mg/dL (ref 8.4–10.5)
Chloride: 101 mEq/L (ref 96–112)
Creatinine, Ser: 2.26 mg/dL — ABNORMAL HIGH (ref 0.40–1.50)
GFR: 32.79 mL/min — ABNORMAL LOW (ref >60.00)
Glucose, Bld: 116 mg/dL — ABNORMAL HIGH (ref 70–99)
Potassium: 3.5 mEq/L (ref 3.5–5.1)
Sodium: 137 mEq/L (ref 135–145)

## 2019-07-04 MED ORDER — ACCU-CHEK AVIVA CONNECT W/DEVICE KIT: 1.0000 | PACK | Freq: Every day | 0 refills | Status: DC

## 2019-07-04 MED ORDER — SPIRONOLACTONE 25 MG PO TABS: 25.0000 mg | ORAL_TABLET | Freq: Every day | ORAL | 1 refills | Status: DC

## 2019-07-04 NOTE — Assessment & Plan Note (Signed)
BP still elevated but greatly improved. After discussion of a couple options today, increasing dose of hydralazine vs adding spironolactone, he agrees with the latter 1. Spironolactone 25 mg added today. We discussed side effects of medication, we need to do a BMP in 3 to 4 weeks. No changes in carvedilol, amlodipine, or hydralazine. Continue monitoring BP at home. Continue low-salt diet. Clearly instructed about warning signs.

## 2019-07-04 NOTE — Assessment & Plan Note (Signed)
For now continue nonpharmacologic treatment. Given his history of DM 2, he benefit from statin medication. Further recommendation will be given according to lipid panel results.

## 2019-07-04 NOTE — Assessment & Plan Note (Signed)
He missed appointment with nephrologist, strongly recommend calling back to reschedule appointment. Continue low-salt diet. Adequate BP control. Avoid NSAIDs. BMP in 3 to 4 weeks.

## 2019-07-04 NOTE — Patient Instructions (Signed)
A few things to remember from today's visit:   Severe hypertension - Plan: Basic Metabolic Panel, spironolactone (ALDACTONE) 25 MG tablet  Hypokalemia - Plan: Basic Metabolic Panel  Stage 3b chronic kidney disease - Plan: Basic Metabolic Panel, Microalbumin / creatinine urine ratio  Sleep apnea, unspecified type  Hypermagnesemia - Plan: Magnesium  Hyperlipidemia associated with type 2 diabetes mellitus ([place]) - Plan: Lipid panel  Today we are adding his prolactin 25 mg daily.  Because this medication can aggravate your kidney function, would like to have kidney function recheck in 3 to 4 weeks. Please call nephrologist office to reschedule your appointment. Continue low-salt diet. Continue monitoring your blood pressure periodically. Check your blood sugar a few times during the week before breakfast or 2 hours after any meal.  Please be sure medication list is accurate. If a new problem present, please set up appointment sooner than planned today.
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
## 2019-07-04 NOTE — Progress Notes (Signed)
HPI:   Paul Rivers is a 38 y.o. male, who is here today for chronic disease management.  He was last seen on 06/13/2019. Since his last visit he has seen cardiologist, Dr. Davina Poke on 06/15/2019.  Lab Results  Component Value Date   CREATININE 2.36 (H) 06/13/2019   BUN 29 (H) 06/13/2019   NA 138 06/13/2019   K 3.5 06/13/2019   CL 100 06/13/2019   CO2 29 06/13/2019   Renal artery [place] on 06/23/2019: Right: Normal size right kidney. No evidence of right renal artery        stenosis. RRV flow present. Normal right Resisitive Index.        The renal parenchyma is hyperechogenic, with similar characteristics to surrounding tissue, consistent with        medical renal disease. Left:  Normal size of left kidney. No evidence of left renal artery stenosis. LRV flow present. Abnormal left Resisitve Index.        The renal parenchyma is hyperechogenic, with similar characteristics to surrounding tissue, consistent with        medical renal disease. Mesenteric: Normal Celiac artery and Superior Mesenteric artery findings. Areas of limited visceral study include right kidney size, left kidney size, right parenchymal flow and left parenchymal flow.  Home BP readings: 140s-190s/80s-low 100s. Denies severe/frequent headache, visual changes, chest pain, dyspnea, palpitation, claudication, focal weakness, or edema.  According to patient, he has not received appointment information for abdominal CT.  CKD 3: He received phone call from the nephrologist office, which he missed. He has not noted foam in urine, gross hematuria, or decreased urine output.  DM2: He denies checking BS.  Lab Results  Component Value Date   HGBA1C 6.3 (H) 06/04/2019   Lab Results  Component Value Date   MICROALBUR 72.7 (H) 02/28/2019    Lab Results  Component Value Date   WBC 5.7 06/04/2019   HGB 13.4 06/04/2019   HCT 38.8 (L) 06/04/2019   MCV 81.0 06/04/2019   PLT 271 06/04/2019    Hyperlipidemia: Currently he is on no pharmacologic treatment. He would like FLP to be checked today.  Lab Results  Component Value Date   CHOL 204 (H) 02/28/2019   HDL 41.50 02/28/2019   LDLCALC 143 (H) 02/28/2019   TRIG 94.0 02/28/2019   CHOLHDL 5 02/28/2019    Mg has been elevated at 2.5 on 06/04/19.  Review of Systems  Constitutional: Negative for activity change, appetite change, fatigue and fever.  HENT: Negative for mouth sores, nosebleeds and sore throat.   Eyes: Negative for pain and redness.  Respiratory: Negative for cough and wheezing.   Gastrointestinal: Negative for abdominal pain, nausea and vomiting.       No changes in bowel habits.  Genitourinary: Negative for dysuria and flank pain.  Neurological: Negative for dizziness, seizures, syncope and numbness.  Rest of ROS, see pertinent positives sand negatives in HPI   Current Outpatient Medications on File Prior to Visit  Medication Sig Dispense Refill  . amLODipine (NORVASC) 10 MG tablet Take 1 tablet (10 mg total) by mouth daily. 90 tablet 1  . carvedilol (COREG) 25 MG tablet Take 1 tablet (25 mg total) by mouth 2 (two) times daily. 180 tablet 3  . hydrALAZINE (APRESOLINE) 50 MG tablet Take 1 tablet (50 mg total) by mouth 3 (three) times daily. 90 tablet 2  . pantoprazole (PROTONIX) 40 MG tablet Take 1 tablet (40 mg total) by mouth daily. 30 tablet  3  . ondansetron (ZOFRAN) 4 MG tablet Take 1 tablet (4 mg total) by mouth every 6 (six) hours as needed for nausea. (Patient not taking: Reported on 07/04/2019) 20 tablet 0   No current facility-administered medications on file prior to visit.      Past Medical History:  Diagnosis Date  . Diabetes mellitus without complication ([place])   . Hyperlipidemia   . Hypertension    No Known Allergies  Social History   Socioeconomic History  . Marital status: Married    Spouse name: Not on file  . Number of children: 3  . Years of education: 20  . Highest education  level: Not on file  Occupational History  . Occupation: Security  Social Needs  . Financial resource strain: Not on file  . Food insecurity    Worry: Not on file    Inability: Not on file  . Transportation needs    Medical: Not on file    Non-medical: Not on file  Tobacco Use  . Smoking status: Never Smoker  . Smokeless tobacco: Never Used  Substance and Sexual Activity  . Alcohol use: Yes    Comment: Rarely  . Drug use: No  . Sexual activity: Not on file  Lifestyle  . Physical activity    Days per week: Not on file    Minutes per session: Not on file  . Stress: Not on file  Relationships  . Social Herbalist on phone: Not on file    Gets together: Not on file    Attends religious service: Not on file    Active member of club or organization: Not on file    Attends meetings of clubs or organizations: Not on file    Relationship status: Not on file  Other Topics Concern  . Not on file  Social History Narrative   Fun/hobby: Keeping up with children (6, 4, 14)    Vitals:   07/04/19 0722  BP: (!) 150/90  Pulse: 78  Resp: 16  Temp: (!) 97.1 F (36.2 C)  SpO2: 99%   Body mass index is 37.73 kg/m.   Physical Exam  Nursing note and vitals reviewed. Constitutional: He is oriented to person, place, and time. He appears well-developed. No distress.  HENT:  Head: Normocephalic and atraumatic.  Mouth/Throat: Oropharynx is clear and moist. Mucous membranes are dry.  Eyes: Pupils are equal, round, and reactive to light. Conjunctivae are normal.  Cardiovascular: Normal rate and regular rhythm.  No murmur heard. Respiratory: Effort normal and breath sounds normal. No respiratory distress.  GI: Soft. He exhibits no mass. There is no hepatomegaly. There is no abdominal tenderness.  Musculoskeletal:        General: Edema (Trace pitting LE edema,bilateral.) present.  Lymphadenopathy:    He has no cervical adenopathy.  Neurological: He is alert and oriented to  person, place, and time. He has normal strength. No cranial nerve deficit. Gait normal.  Skin: Skin is warm. No rash noted. No erythema.  Psychiatric: He has a normal mood and affect. Cognition and memory are normal.  Well groomed, good eye contact.   ASSESSMENT AND PLAN:  Paul Rivers was seen today for chronic disease management.  Orders Placed This Encounter  Procedures  . Basic Metabolic Panel  . Magnesium  . Lipid panel  . Microalbumin / creatinine urine ratio   Lab Results  Component Value Date   CREATININE 2.26 (H) 07/04/2019   BUN 26 (H) 07/04/2019  NA 137 07/04/2019   K 3.5 07/04/2019   CL 101 07/04/2019   CO2 26 07/04/2019   Lab Results  Component Value Date   CHOL 204 (H) 07/04/2019   HDL 43.50 07/04/2019   LDLCALC 145 (H) 07/04/2019   TRIG 75.0 07/04/2019   CHOLHDL 5 07/04/2019    Lab Results  Component Value Date   MICROALBUR 72.7 (H) 02/28/2019    Sleep apnea, unspecified type He has not have sleep study done. I think this could be done through his cardiologist's office. I will try to arrange home study,otherwsie referral to pulmonologist will be considered..  Hypermagnesemia Further recommendation will be given according to Mg results.  Need for influenza vaccination - Flu Vaccine QUAD 6+ mos PF IM (Fluarix Quad PF)  CKD (chronic kidney disease), stage III He missed appointment with nephrologist, strongly recommend calling back to reschedule appointment. Continue low-salt diet. Adequate BP control. Avoid NSAIDs. BMP in 3 to 4 weeks.  Essential hypertension, benign BP still elevated but greatly improved. After discussion of a couple options today, increasing dose of hydralazine vs adding spironolactone, he agrees with the latter 1. Spironolactone 25 mg added today. We discussed side effects of medication, we need to do a BMP in 3 to 4 weeks. No changes in carvedilol, amlodipine, or hydralazine. Continue monitoring BP at home.  Continue low-salt diet. Clearly instructed about warning signs.  Hyperlipidemia associated with type 2 diabetes mellitus ([place]) For now continue nonpharmacologic treatment. Given his history of DM 2, he benefit from statin medication. Further recommendation will be given according to lipid panel results.   Return in about 2 months (around 09/03/2019) for Lab in 3-4 weeks..   Lam Bjorklund G. [place], MD  [facility]. [place] office.

## 2019-07-05 ENCOUNTER — Ambulatory Visit (INDEPENDENT_AMBULATORY_CARE_PROVIDER_SITE_OTHER): Payer: Commercial Managed Care - PPO | Admitting: Cardiovascular Disease

## 2019-07-05 ENCOUNTER — Encounter: Payer: Self-pay | Admitting: Cardiovascular Disease

## 2019-07-05 VITALS — BP 180/110 | HR 80 | Ht 63.0 in | Wt 212.4 lb

## 2019-07-05 DIAGNOSIS — I10 Essential (primary) hypertension: Secondary | ICD-10-CM

## 2019-07-05 DIAGNOSIS — N1832 Chronic kidney disease, stage 3b: Secondary | ICD-10-CM

## 2019-07-05 DIAGNOSIS — E782 Mixed hyperlipidemia: Secondary | ICD-10-CM | POA: Diagnosis not present

## 2019-07-05 NOTE — Patient Instructions (Signed)
Special Instructions: REFER TO NEPHROLOGY-THEY WILL BE CALLING YOU TO SCHEDULE AN APPOINTMENT  Medication Instructions:  The current medical regimen is effective;  continue present plan and medications as directed. Please refer to the Current Medication list given to you today. If you need a refill on your cardiac medications before your next appointment, please call your pharmacy.  Labwork: BMET IN 1 WEEK 07-11-2019 HERE IN OUR OFFICE AT [facility]    You will NOT need to fast   If you have labs (blood work) drawn today and your tests are completely normal, you will receive your results only by: [place] MyChart Message (if you have MyChart) OR . A paper copy in the mail If you have any lab test that is abnormal or we need to change your treatment, we will call you to review the results.  Follow-Up: IN 1 MONTH WITH You may see Evalina Field, MD or one of the following Advanced Practice Providers on your designated Care Team:  Almyra Deforest, PA-C  Fabian Sharp, PA-C or [place], [place].    At [facility], you and your health needs are our priority.  As part of our continuing mission to provide you with exceptional heart care, we have created designated Provider Care Teams.  These Care Teams include your primary Cardiologist (physician) and Advanced Practice Providers (APPs -  Physician Assistants and Nurse Practitioners) who all work together to provide you with the care you need, when you need it.  Thank you for choosing [facility] at [facility]!!

## 2019-07-06 MED ORDER — ROSUVASTATIN CALCIUM 10 MG PO TABS: 10.0000 mg | ORAL_TABLET | Freq: Every day | ORAL | 3 refills | Status: DC

## 2019-07-07 ENCOUNTER — Other Ambulatory Visit: Payer: Commercial Managed Care - PPO
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
## 2019-07-12 ENCOUNTER — Ambulatory Visit: Payer: Self-pay | Admitting: Cardiovascular Disease

## 2019-08-01 NOTE — Progress Notes (Deleted)
Cardiology Office Note:   Date:  08/01/2019  NAME:  Paul Rivers    MRN: [id number] DOB:  12/24/1980   PCP:  Martinique, Betty G, MD  Cardiologist:  Evalina Field, MD  Electrophysiologist:  None   Referring MD: Martinique, Betty G, MD   No chief complaint on file. ***  History of Present Illness:   Paul Rivers is a 38 y.o. male with a hx of severe hypertension, diabetes (treated with weight loss/exercise) who presents for follow-up.  Past Medical History: Past Medical History:  Diagnosis Date  . Diabetes mellitus without complication ([place])   . Hyperlipidemia   . Hypertension     Past Surgical History: No past surgical history on file.  Current Medications: No outpatient medications have been marked as taking for the 08/05/19 encounter (Appointment) with O'Neal, Cassie Freer, MD.     Allergies:    Patient has no known allergies.   Social History: Social History   Socioeconomic History  . Marital status: Married    Spouse name: Not on file  . Number of children: 3  . Years of education: 11  . Highest education level: Not on file  Occupational History  . Occupation: Security  Social Needs  . Financial resource strain: Not on file  . Food insecurity    Worry: Not on file    Inability: Not on file  . Transportation needs    Medical: Not on file    Non-medical: Not on file  Tobacco Use  . Smoking status: Never Smoker  . Smokeless tobacco: Never Used  Substance and Sexual Activity  . Alcohol use: Yes    Comment: Rarely  . Drug use: No  . Sexual activity: Not on file  Lifestyle  . Physical activity    Days per week: Not on file    Minutes per session: Not on file  . Stress: Not on file  Relationships  . Social Herbalist on phone: Not on file    Gets together: Not on file    Attends religious service: Not on file    Active member of club or organization: Not on file    Attends meetings of clubs or organizations: Not on file    Relationship  status: Not on file  Other Topics Concern  . Not on file  Social History Narrative   Fun/hobby: Keeping up with children (6, 4, 14)     Family History: The patient's ***family history includes Diabetes in his father and mother; Hypertension in his father, maternal grandmother, and mother.  ROS:   All other ROS reviewed and negative. Pertinent positives noted in the HPI.     EKGs/Labs/Other Studies Reviewed:   The following studies were personally reviewed by me today:  EKG:  EKG is *** ordered today.  The ekg ordered today demonstrates ***, and was personally reviewed by me.  Recent labs: A1c 6.3, TSH 2.36  TTE 06/14/2019 1. Left ventricular ejection fraction, by visual estimation, is 55 to 60%. The left ventricle has normal function. There is severely increased left ventricular hypertrophy. 2. Left ventricular diastolic Doppler parameters are consistent with pseudonormalization pattern of LV diastolic filling. 3. Elevated mean left atrial pressure. 4. Global right ventricle has normal systolic function.The right ventricular size is normal. 5. Left atrial size was mildly dilated. 6. Right atrial size was normal. 7. The mitral valve is normal in structure. No evidence of mitral valve regurgitation. 8. The tricuspid valve is normal in structure.  Tricuspid valve regurgitation was not visualized by color flow Doppler. 9. The aortic valve is tricuspid Aortic valve regurgitation was not visualized by color flow Doppler. 10. The pulmonic valve was grossly normal. Pulmonic valve regurgitation is not visualized by color flow Doppler. 11. The inferior vena cava is normal in size with greater than 50% respiratory variability, suggesting right atrial pressure of 3 mmHg.  Renal Artery [place] 06/23/2019 Renal:  Right: Normal size right kidney. No evidence of right renal artery stenosis. RRV flow present. Normal right Resisitive Index. The renal parenchyma is  hyperechogenic, with similar characteristics to surrounding tissue, consistent with medical renal disease. Left: Normal size of left kidney. No evidence of left renal artery stenosis. LRV flow present. Abnormal left Resisitve Index. The renal parenchyma is hyperechogenic, with similar characteristics to surrounding tissue, consistent with medical renal disease. Mesenteric: Normal Celiac artery and Superior Mesenteric artery findings. Areas of limited visceral study include right kidney size, left kidney size, right parenchymal flow and left parenchymal flow. Recent Labs: 06/04/2019: ALT 24; Hemoglobin 13.4; Platelets 271; TSH 2.362 07/04/2019: BUN 26; Creatinine, Ser 2.26; Magnesium 2.2; Potassium 3.5; Sodium 137   Recent Lipid Panel    Component Value Date/Time   CHOL 204 (H) 07/04/2019 0747   TRIG 75.0 07/04/2019 0747   HDL 43.50 07/04/2019 0747   CHOLHDL 5 07/04/2019 0747   VLDL 15.0 07/04/2019 0747   LDLCALC 145 (H) 07/04/2019 0747    Physical Exam:   VS:  There were no vitals taken for this visit.   Wt Readings from Last 3 Encounters:  07/05/19 212 lb 6.4 oz (96.3 kg)  07/04/19 213 lb (96.6 kg)  06/15/19 213 lb 12.8 oz (97 kg)    General: Well nourished, well developed, in no acute distress Heart: Atraumatic, normal size  Eyes: PEERLA, EOMI  Neck: Supple, no JVD Endocrine: No thryomegaly Cardiac: Normal S1, S2; RRR; no murmurs, rubs, or gallops Lungs: Clear to auscultation bilaterally, no wheezing, rhonchi or rales  Abd: Soft, nontender, no hepatomegaly  Ext: No edema, pulses 2+ Musculoskeletal: No deformities, BUE and BLE strength normal and equal Skin: Warm and dry, no rashes   Neuro: Alert and oriented to person, place, time, and situation, CNII-XII grossly intact, no focal deficits  Psych: Normal mood and affect   ASSESSMENT:   Paul Rivers is a 38 y.o. male who presents for the following: No diagnosis found.   PLAN:   There are no diagnoses linked to this encounter.  Disposition: No follow-ups on file.  Medication Adjustments/Labs and Tests Ordered: Current medicines are reviewed at length with the patient today.  Concerns regarding medicines are outlined above.  No orders of the defined types were placed in this encounter.  No orders of the defined types were placed in this encounter.   There are no Patient Instructions on file for this visit.   Signed, Addison Naegeli. Audie Box, [place]  [address] [place], [place] [address] [phone]  08/01/2019 12:42 PM

## 2019-08-05 ENCOUNTER — Ambulatory Visit: Payer: Commercial Managed Care - PPO | Admitting: Cardiovascular Disease

## 2019-09-02 ENCOUNTER — Ambulatory Visit (INDEPENDENT_AMBULATORY_CARE_PROVIDER_SITE_OTHER): Payer: Commercial Managed Care - PPO | Admitting: Family Medicine

## 2019-09-02 ENCOUNTER — Encounter: Payer: Self-pay | Admitting: Family Medicine

## 2019-09-02 ENCOUNTER — Other Ambulatory Visit: Payer: Self-pay

## 2019-09-02 VITALS — BP 130/80 | HR 86 | Temp 96.3°F | Resp 12 | Ht 63.0 in | Wt 224.0 lb

## 2019-09-02 DIAGNOSIS — E559 Vitamin D deficiency, unspecified: Secondary | ICD-10-CM

## 2019-09-02 DIAGNOSIS — N1832 Chronic kidney disease, stage 3b: Secondary | ICD-10-CM

## 2019-09-02 DIAGNOSIS — I10 Essential (primary) hypertension: Secondary | ICD-10-CM | POA: Diagnosis not present

## 2019-09-02 LAB — CBC
HCT: 41.7 % (ref 39.0–52.0)
Hemoglobin: 13.9 g/dL (ref 13.0–17.0)
MCHC: 33.3 g/dL (ref 30.0–36.0)
MCV: 83.1 fl (ref 78.0–100.0)
Platelets: 272 10*3/uL (ref 150.0–400.0)
RBC: 5.01 Mil/uL (ref 4.22–5.81)
RDW: 14.9 % (ref 11.5–15.5)
WBC: 3.7 10*3/uL — ABNORMAL LOW (ref 4.0–10.5)

## 2019-09-02 LAB — BASIC METABOLIC PANEL
BUN: 25 mg/dL — ABNORMAL HIGH (ref 6–23)
CO2: 23 mEq/L (ref 19–32)
Calcium: 9.2 mg/dL (ref 8.4–10.5)
Chloride: 103 mEq/L (ref 96–112)
Creatinine, Ser: 2.06 mg/dL — ABNORMAL HIGH (ref 0.40–1.50)
GFR: 36.46 mL/min — ABNORMAL LOW (ref >60.00)
Glucose, Bld: 114 mg/dL — ABNORMAL HIGH (ref 70–99)
Potassium: 3.6 mEq/L (ref 3.5–5.1)
Sodium: 138 mEq/L (ref 135–145)

## 2019-09-02 LAB — VITAMIN D 25 HYDROXY (VIT D DEFICIENCY, FRACTURES): VITD: 12.91 ng/mL — ABNORMAL LOW (ref 30.00–100.00)

## 2019-09-02 LAB — MICROALBUMIN / CREATININE URINE RATIO
Creatinine,U: 45.2 mg/dL
Microalb Creat Ratio: 13.2 mg/g (ref 0.0–30.0)
Microalb, Ur: 6 mg/dL — ABNORMAL HIGH (ref 0.0–1.9)

## 2019-09-02 MED ORDER — SPIRONOLACTONE 25 MG PO TABS: 25.0000 mg | ORAL_TABLET | Freq: Every day | ORAL | 0 refills | Status: DC

## 2019-09-02 NOTE — Progress Notes (Signed)
Paul Rivers is a 39 y.o.male, who is here today for chronic disease management. Last follow up visit: 07/04/19.  Currently on Spironolactone 25 mg daily,Hydralazine 50 mg tid,Amlodipine 10 mg daily.  He ran out of Spironolactone last week. No side effects reported.  He has not noted unusual headache, visual changes, exertional chest pain, dyspnea,  focal weakness, or edema. Home BP readings: 160's/90's.  CKD III, he has not noted gross hematuria, decreased urine output,or foam in urine. He ahs not seen nephrologist and has not had abdominal CT done.  Lab Results  Component Value Date   CREATININE 2.26 (H) 07/04/2019   BUN 26 (H) 07/04/2019   NA 137 07/04/2019   K 3.5 07/04/2019   CL 101 07/04/2019   CO2 26 07/04/2019   Lab Results  Component Value Date   MICROALBUR 72.7 (H) 02/28/2019   Review of Systems  Constitutional: Negative for activity change, appetite change, fatigue and fever.  HENT: Negative for mouth sores, nosebleeds and sore throat.   Respiratory: Negative for cough and wheezing.   Gastrointestinal: Negative for abdominal pain, nausea and vomiting.  Musculoskeletal: Negative for gait problem and myalgias.  Neurological: Negative for dizziness, syncope and facial asymmetry.   Rest see pertinent positives and negatives per HPI.  Current Outpatient Medications on File Prior to Visit  Medication Sig Dispense Refill  . amLODipine (NORVASC) 10 MG tablet Take 1 tablet (10 mg total) by mouth daily. 90 tablet 1  . Blood Glucose Monitoring Suppl (ACCU-CHEK AVIVA CONNECT) w/Device KIT 1 Device by Does not apply route daily. 1 kit 0  . carvedilol (COREG) 25 MG tablet Take 1 tablet (25 mg total) by mouth 2 (two) times daily. 180 tablet 3  . hydrALAZINE (APRESOLINE) 50 MG tablet Take 1 tablet (50 mg total) by mouth 3 (three) times daily. 90 tablet 2  . ondansetron (ZOFRAN) 4 MG tablet Take 1 tablet (4 mg total) by mouth every 6 (six) hours as needed for nausea.  20 tablet 0  . pantoprazole (PROTONIX) 40 MG tablet Take 1 tablet (40 mg total) by mouth daily. 30 tablet 3  . rosuvastatin (CRESTOR) 10 MG tablet Take 1 tablet (10 mg total) by mouth daily. 90 tablet 3   No current facility-administered medications on file prior to visit.     Past Medical History:  Diagnosis Date  . Diabetes mellitus without complication ([place])   . Hyperlipidemia   . Hypertension     No Known Allergies  Social History   Socioeconomic History  . Marital status: Married    Spouse name: Not on file  . Number of children: 3  . Years of education: 56  . Highest education level: Not on file  Occupational History  . Occupation: Security  Tobacco Use  . Smoking status: Never Smoker  . Smokeless tobacco: Never Used  Substance and Sexual Activity  . Alcohol use: Yes    Comment: Rarely  . Drug use: No  . Sexual activity: Not on file  Other Topics Concern  . Not on file  Social History Narrative   Fun/hobby: Keeping up with children (6, 4, 14)   Social Determinants of Health   Financial Resource Strain:   . Difficulty of Paying Living Expenses: Not on file  Food Insecurity:   . Worried About Charity fundraiser in the Last Year: Not on file  . Ran Out of Food in the Last Year: Not on file  Transportation Needs:   .  Lack of Transportation (Medical): Not on file  . Lack of Transportation (Non-Medical): Not on file  Physical Activity:   . Days of Exercise per Week: Not on file  . Minutes of Exercise per Session: Not on file  Stress:   . Feeling of Stress : Not on file  Social Connections:   . Frequency of Communication with Friends and Family: Not on file  . Frequency of Social Gatherings with Friends and Family: Not on file  . Attends Religious Services: Not on file  . Active Member of Clubs or Organizations: Not on file  . Attends Archivist Meetings: Not on file  . Marital Status: Not on file    Vitals:   09/02/19 0730  BP: 130/80    Pulse: 86  Resp: 12  Temp: (!) 96.3 F (35.7 C)  SpO2: 98%   Body mass index is 39.68 kg/m.   Physical Exam  Nursing note reviewed. Constitutional: He is oriented to person, place, and time. He appears well-developed. No distress.  HENT:  Head: Normocephalic and atraumatic.  Mouth/Throat: Oropharynx is clear and moist and mucous membranes are normal.  Eyes: Pupils are equal, round, and reactive to light. Conjunctivae are normal.  Cardiovascular: Normal rate and regular rhythm.  No murmur heard. Pulses:      Dorsalis pedis pulses are 2+ on the right side and 2+ on the left side.  Respiratory: Effort normal and breath sounds normal. No respiratory distress.  GI: Soft. He exhibits no mass. There is no hepatomegaly. There is no abdominal tenderness.  Musculoskeletal:        General: No edema.  Lymphadenopathy:    He has no cervical adenopathy.  Neurological: He is alert and oriented to person, place, and time. He has normal strength. No cranial nerve deficit. Gait normal.  Skin: Skin is warm. No rash noted. No erythema.  Psychiatric: He has a normal mood and affect.  Well groomed, good eye contact.    ASSESSMENT AND PLAN:   Paul Rivers was seen today for follow-up.  Diagnoses and all orders for this visit:  Lab Results  Component Value Date   CREATININE 2.06 (H) 09/02/2019   BUN 25 (H) 09/02/2019   NA 138 09/02/2019   K 3.6 09/02/2019   CL 103 09/02/2019   CO2 23 09/02/2019   Lab Results  Component Value Date   MICROALBUR 6.0 (H) 09/02/2019   MICROALBUR 72.7 (H) 02/28/2019      Severe hypertension Re-checked 170/110 and 160/100. Possible complications discussed. Resume Spironolactone. Continue monitoring BP. Low salt diet.  Elevated metanephrines in plasma. Abdominal CT will be re-ordered.  -     Basic Metabolic Panel -     spironolactone (ALDACTONE) 25 MG tablet; Take 1 tablet (25 mg total) by mouth daily. -     CT ADRENAL ABD WO; Future  Stage 3b  chronic kidney disease Adequate BP and glucose control. Adequate hydration. Pending appt with nephrologist,he needs to re-schedule. Low salt diet and avoidance of NSAID's.  -     Microalbumin / creatinine urine ratio -     VITAMIN D 25 Hydroxy (Vit-D Deficiency, Fractures) -     Basic Metabolic Panel -     CBC -     CT ADRENAL ABD WO; Future   Return in about 3 months (around 12/01/2019).    Favian Kittleson G. Martinique, MD  [facility]. [place] office.

## 2019-09-02 NOTE — Patient Instructions (Signed)
A few things to remember from today's visit:   Stage 3b chronic kidney disease - Plan: Microalbumin / creatinine urine ratio, VITAMIN D 25 Hydroxy (Vit-D Deficiency, Fractures), Basic Metabolic Panel, CBC  Severe hypertension - Plan: Basic Metabolic Panel  Resume Spironolactone. Blood pressure today 170/110 and 160/100.  Low salt diet.  Please schedule abdomen imaging and kidney doctor appointment.  Please be sure medication list is accurate. If a new problem present, please set up appointment sooner than planned today.

## 2019-09-03 MED ORDER — VITAMIN D (ERGOCALCIFEROL) 1.25 MG (50000 UNIT) PO CAPS: 50000.0000 [IU] | ORAL_CAPSULE | ORAL | 0 refills | Status: DC

## 2019-11-29 LAB — METANEPHRINES, PLASMA
Metanephrine, Free: 72.4 pg/mL (ref 0.0–88.0)
Normetanephrine, Free: 179.3 pg/mL — ABNORMAL HIGH (ref 0.0–110.1)

## 2019-11-29 LAB — ALDOSTERONE + RENIN ACTIVITY W/ RATIO
ALDO / PRA Ratio: 5.7 (ref 0.0–30.0)
Aldosterone: 19.4 ng/dL (ref 0.0–30.0)
PRA LC/MS/MS: 3.384 ng/mL/hr (ref 0.167–5.380)

## 2019-12-02 ENCOUNTER — Ambulatory Visit: Payer: Commercial Managed Care - PPO | Admitting: Family Medicine

## 2019-12-05 ENCOUNTER — Ambulatory Visit: Payer: Commercial Managed Care - PPO | Admitting: Family Medicine

## 2019-12-07 ENCOUNTER — Encounter: Payer: Self-pay | Admitting: Family Medicine

## 2019-12-07 ENCOUNTER — Other Ambulatory Visit: Payer: Self-pay

## 2019-12-07 ENCOUNTER — Ambulatory Visit (INDEPENDENT_AMBULATORY_CARE_PROVIDER_SITE_OTHER): Payer: Commercial Managed Care - PPO | Admitting: Family Medicine

## 2019-12-07 VITALS — BP 140/84 | HR 79 | Temp 97.5°F | Resp 12 | Ht 63.0 in | Wt 227.5 lb

## 2019-12-07 DIAGNOSIS — E1169 Type 2 diabetes mellitus with other specified complication: Secondary | ICD-10-CM

## 2019-12-07 DIAGNOSIS — I10 Essential (primary) hypertension: Secondary | ICD-10-CM | POA: Diagnosis not present

## 2019-12-07 DIAGNOSIS — N1832 Chronic kidney disease, stage 3b: Secondary | ICD-10-CM | POA: Diagnosis not present

## 2019-12-07 DIAGNOSIS — E559 Vitamin D deficiency, unspecified: Secondary | ICD-10-CM | POA: Insufficient documentation

## 2019-12-07 DIAGNOSIS — E785 Hyperlipidemia, unspecified: Secondary | ICD-10-CM

## 2019-12-07 LAB — COMPREHENSIVE METABOLIC PANEL
ALT: 33 U/L (ref 0–53)
AST: 28 U/L (ref 0–37)
Albumin: 5 g/dL (ref 3.5–5.2)
Alkaline Phosphatase: 62 U/L (ref 39–117)
BUN: 26 mg/dL — ABNORMAL HIGH (ref 6–23)
CO2: 25 mEq/L (ref 19–32)
Calcium: 9.2 mg/dL (ref 8.4–10.5)
Chloride: 101 mEq/L (ref 96–112)
Creatinine, Ser: 1.97 mg/dL — ABNORMAL HIGH (ref 0.40–1.50)
GFR: 38.33 mL/min — ABNORMAL LOW (ref >60.00)
Glucose, Bld: 139 mg/dL — ABNORMAL HIGH (ref 70–99)
Potassium: 3.8 mEq/L (ref 3.5–5.1)
Sodium: 136 mEq/L (ref 135–145)
Total Bilirubin: 0.4 mg/dL (ref 0.2–1.2)
Total Protein: 7.6 g/dL (ref 6.0–8.3)

## 2019-12-07 LAB — LIPID PANEL
Cholesterol: 180 mg/dL (ref 0–200)
HDL: 36.9 mg/dL — ABNORMAL LOW (ref >39.00)
LDL Cholesterol: 125 mg/dL — ABNORMAL HIGH (ref 0–99)
NonHDL: 143.56
Total CHOL/HDL Ratio: 5
Triglycerides: 95 mg/dL (ref 0.0–149.0)
VLDL: 19 mg/dL (ref 0.0–40.0)

## 2019-12-07 MED ORDER — SPIRONOLACTONE 25 MG PO TABS: 25.0000 mg | ORAL_TABLET | Freq: Every day | ORAL | 2 refills | Status: DC

## 2019-12-07 MED ORDER — AMLODIPINE BESYLATE 10 MG PO TABS: 10.0000 mg | ORAL_TABLET | Freq: Every day | ORAL | 3 refills | Status: DC

## 2019-12-07 NOTE — Assessment & Plan Note (Signed)
Avoid NSAID's. Continue low salt diet. He is not on ACEI or ARB's, will be considered if BP still not at goal. Renal Duplex negative for renal arty stenosis. Adequate BP and glucose controlled  Instructed to be sure he keeps appts with nephrologist.

## 2019-12-07 NOTE — Assessment & Plan Note (Signed)
Re-checked 160/100 LUE. Resume Spironolactone 25 mg to take daily. No changes in rest of medications. Educated about possible complications of elevated BP. Continue low salt diet. Recommend arranging eye exam. Instructed about warning signs.

## 2019-12-07 NOTE — Assessment & Plan Note (Signed)
Start Ergocalciferol 50,000 U weekly x 8 weeks then continue oral 2000 U daily. Will check 25 OH vit D next visit.

## 2019-12-07 NOTE — Progress Notes (Signed)
HPI:   Paul Rivers is a 39 y.o. male, who is here today for 3-4 months follow up.   He was last seen on 09/02/19.  HTN: He ran out of Spironolactone a few weeks ago. He is taking Carvedilol 25 mg bid,Amlodipine 10 mg daily and Hydralazine 50 mg tid. BP readings at hoe 150-160/90's. Denies severe/frequent headache, visual changes, chest pain, dyspnea, palpitation, claudication, focal weakness, or edema.  CKD III: He has not noted gross hematuria,foam in urine,or decreased urine output.  He does not remember last visit with nephrologist.  Component     Latest Ref Rng & Units 09/02/2019          Sodium     135 - 145 mEq/L 138  Potassium     3.5 - 5.1 mEq/L 3.6  Chloride     96 - 112 mEq/L 103  CO2     19 - 32 mEq/L 23  Glucose     70 - 99 mg/dL 114 (H)  BUN     6 - 23 mg/dL 25 (H)  Creatinine     0.40 - 1.50 mg/dL 2.06 (H)  Calcium     8.4 - 10.5 mg/dL 9.2  GFR     >60.00 mL/min 36.46 (L)   Vit D deficiency: He is not sure if her got the Ergocalciferol 50,000 U. Currently he is not on vit D supplementation. 25 OH vit D 12.9 on 09/02/19.  Mildly elevated metanephrine in plasma on 06/03/19. He has not had adrenal CT, it has been ordered x 2. He thinks he may have received a phone call in this regard. He has had several appts since his last visit, he is not sure who he has seen.  HLD: Last visit Crestor 10 mg was added. He has tolerated med well.  Component     Latest Ref Rng & Units 07/04/2019  Cholesterol     0 - 200 mg/dL 204 (H)  Triglycerides     0.0 - 149.0 mg/dL 75.0  HDL Cholesterol     >39.00 mg/dL 43.50  VLDL     0.0 - 40.0 mg/dL 15.0  LDL (calc)     0 - 99 mg/dL 145 (H)  Total CHOL/HDL Ratio      5  NonHDL      160.25   Review of Systems  Constitutional: Negative for activity change, appetite change, fatigue and fever.  HENT: Negative for mouth sores, nosebleeds and sore throat.   Eyes: Negative for redness and visual disturbance.    Respiratory: Negative for cough and wheezing.   Gastrointestinal: Negative for abdominal pain, nausea and vomiting.  Genitourinary: Negative for dysuria.  Musculoskeletal: Negative for gait problem and myalgias.  Skin: Negative for pallor and rash.  Neurological: Negative for dizziness, syncope and numbness.  Psychiatric/Behavioral: Negative for confusion and sleep disturbance.  Rest of ROS, see pertinent positives sand negatives in HPI  Current Outpatient Medications on File Prior to Visit  Medication Sig Dispense Refill  . Blood Glucose Monitoring Suppl (ACCU-CHEK AVIVA CONNECT) w/Device KIT 1 Device by Does not apply route daily. 1 kit 0  . hydrALAZINE (APRESOLINE) 50 MG tablet Take 1 tablet (50 mg total) by mouth 3 (three) times daily. 90 tablet 2  . ondansetron (ZOFRAN) 4 MG tablet Take 1 tablet (4 mg total) by mouth every 6 (six) hours as needed for nausea. 20 tablet 0  . pantoprazole (PROTONIX) 40 MG tablet Take 1 tablet (40 mg total)  by mouth daily. 30 tablet 3  . rosuvastatin (CRESTOR) 10 MG tablet Take 1 tablet (10 mg total) by mouth daily. 90 tablet 3  . Vitamin D, Ergocalciferol, (DRISDOL) 1.25 MG (50000 UT) CAPS capsule Take 1 capsule (50,000 Units total) by mouth every 7 (seven) days. 12 capsule 0  . carvedilol (COREG) 25 MG tablet Take 1 tablet (25 mg total) by mouth 2 (two) times daily. 180 tablet 3   No current facility-administered medications on file prior to visit.   Past Medical History:  Diagnosis Date  . Diabetes mellitus without complication ([place])   . Hyperlipidemia   . Hypertension    No Known Allergies  Social History   Socioeconomic History  . Marital status: Married    Spouse name: Not on file  . Number of children: 3  . Years of education: 29  . Highest education level: Not on file  Occupational History  . Occupation: Security  Tobacco Use  . Smoking status: Never Smoker  . Smokeless tobacco: Never Used  Substance and Sexual Activity  .  Alcohol use: Yes    Comment: Rarely  . Drug use: No  . Sexual activity: Not on file  Other Topics Concern  . Not on file  Social History Narrative   Fun/hobby: Keeping up with children (6, 4, 14)   Social Determinants of Health   Financial Resource Strain:   . Difficulty of Paying Living Expenses:   Food Insecurity:   . Worried About Charity fundraiser in the Last Year:   . Arboriculturist in the Last Year:   Transportation Needs:   . Film/video editor (Medical):   Marland Kitchen Lack of Transportation (Non-Medical):   Physical Activity:   . Days of Exercise per Week:   . Minutes of Exercise per Session:   Stress:   . Feeling of Stress :   Social Connections:   . Frequency of Communication with Friends and Family:   . Frequency of Social Gatherings with Friends and Family:   . Attends Religious Services:   . Active Member of Clubs or Organizations:   . Attends Archivist Meetings:   Marland Kitchen Marital Status:     Vitals:   12/07/19 0703  BP: 140/84  Pulse: 79  Resp: 12  Temp: (!) 97.5 F (36.4 C)  SpO2: 96%   Body mass index is 40.3 kg/m.  Physical Exam  Nursing note and vitals reviewed. Constitutional: He is oriented to person, place, and time. He appears well-developed. No distress.  HENT:  Head: Normocephalic and atraumatic.  Mouth/Throat: Oropharynx is clear and moist and mucous membranes are normal.  Eyes: Pupils are equal, round, and reactive to light. Conjunctivae are normal.  Cardiovascular: Normal rate and regular rhythm.  No murmur heard. Pulses:      Dorsalis pedis pulses are 2+ on the right side and 2+ on the left side.  Respiratory: Effort normal and breath sounds normal. No respiratory distress.  GI: Soft. He exhibits no mass. There is no hepatomegaly. There is no abdominal tenderness.  Musculoskeletal:        General: No edema.  Lymphadenopathy:    He has no cervical adenopathy.  Neurological: He is alert and oriented to person, place, and time.  He has normal strength. No cranial nerve deficit. Gait normal.  Skin: Skin is warm. No rash noted. No erythema.  Psychiatric: He has a normal mood and affect.  Well groomed, good eye contact.    ASSESSMENT AND  PLAN:  Paul Rivers was seen today for 3-4 months follow-up.  Orders Placed This Encounter  Procedures  . Comprehensive metabolic panel  . Lipid panel   Lab Results  Component Value Date   CHOL 180 12/07/2019   HDL 36.90 (L) 12/07/2019   LDLCALC 125 (H) 12/07/2019   TRIG 95.0 12/07/2019   CHOLHDL 5 12/07/2019   Lab Results  Component Value Date   CREATININE 1.97 (H) 12/07/2019   BUN 26 (H) 12/07/2019   NA 136 12/07/2019   K 3.8 12/07/2019   CL 101 12/07/2019   CO2 25 12/07/2019    Resistant hypertension Re-checked 160/100 LUE. Resume Spironolactone 25 mg to take daily. No changes in rest of medications. Educated about possible complications of elevated BP. Continue low salt diet. Recommend arranging eye exam. Instructed about warning signs.  CKD (chronic kidney disease), stage III Avoid NSAID's. Continue low salt diet. He is not on ACEI or ARB's, will be considered if BP still not at goal. Renal Duplex negative for renal arty stenosis. Adequate BP and glucose controlled  Instructed to be sure he keeps appts with nephrologist.  Hyperlipidemia associated with type 2 diabetes mellitus ([place]) No changes in current management, will follow FLP done today and will give further recommendations accordingly.   Vitamin D deficiency, unspecified Start Ergocalciferol 50,000 U weekly x 8 weeks then continue oral 2000 U daily. Will check 25 OH vit D next visit.    Return in about 4 months (around 04/07/2020) for CPE/HTN.  Adithi Gammon G. Martinique, MD  [facility]. [place] office.  A few things to remember from today's visit:   Resume Spironolactone. Continue checking blood pressure. Please call to schedule abdomen imaging. Low salt  diet.   Please be sure medication list is accurate. If a new problem present, please set up appointment sooner than planned today.

## 2019-12-07 NOTE — Assessment & Plan Note (Signed)
No changes in current management, will follow FLP done today and will give further recommendations accordingly.

## 2019-12-07 NOTE — Patient Instructions (Signed)
A few things to remember from today's visit:   Stage 3b chronic kidney disease - Plan: Comprehensive metabolic panel  Severe hypertension - Plan: Comprehensive metabolic panel, spironolactone (ALDACTONE) 25 MG tablet  Vitamin D deficiency, unspecified  Hyperlipidemia associated with type 2 diabetes mellitus ([place]) - Plan: Lipid panel  Resume Spironolactone. Continue checking blood pressure. Please call to schedule abdomen imaging. Low salt diet.   Please be sure medication list is accurate. If a new problem present, please set up appointment sooner than planned today.

## 2020-02-17 ENCOUNTER — Telehealth: Payer: Self-pay | Admitting: Family Medicine

## 2020-02-17 ENCOUNTER — Other Ambulatory Visit: Payer: Self-pay | Admitting: Family Medicine

## 2020-02-17 DIAGNOSIS — N1831 Chronic kidney disease, stage 3a: Secondary | ICD-10-CM

## 2020-02-17 NOTE — Telephone Encounter (Signed)
Referral placed. Thanks, BJ

## 2020-02-17 NOTE — Telephone Encounter (Signed)
Spoke to pt and he stated that he can no longer be scheduled at [facility] for missing too many appt. Pt stated he found a Kidney place in [place] called [facility] in [facility] and will need a referral.  Ok for referral? Please advise

## 2020-02-17 NOTE — Telephone Encounter (Signed)
Pt is calling in stating that he has the name of the kidney facility ([facility] in [place]  [address], [employer]) and they are in need of a referral so pt can be seen.

## 2020-02-20 NOTE — Telephone Encounter (Signed)
Patient notified of update  and verbalized understanding. 

## 2020-04-11 ENCOUNTER — Encounter: Payer: Commercial Managed Care - PPO | Admitting: Family Medicine

## 2020-08-10 ENCOUNTER — Other Ambulatory Visit: Payer: Self-pay | Admitting: Cardiovascular Disease

## 2020-08-10 ENCOUNTER — Other Ambulatory Visit: Payer: Self-pay | Admitting: Family Medicine

## 2020-08-10 DIAGNOSIS — I161 Hypertensive emergency: Secondary | ICD-10-CM

## 2020-09-12 ENCOUNTER — Telehealth (INDEPENDENT_AMBULATORY_CARE_PROVIDER_SITE_OTHER): Payer: Commercial Managed Care - PPO | Admitting: Family Medicine

## 2020-09-12 ENCOUNTER — Encounter: Payer: Self-pay | Admitting: Family Medicine

## 2020-09-12 VITALS — Temp 101.0°F | Ht 63.0 in

## 2020-09-12 DIAGNOSIS — I10 Essential (primary) hypertension: Secondary | ICD-10-CM

## 2020-09-12 DIAGNOSIS — U071 COVID-19: Secondary | ICD-10-CM

## 2020-09-12 MED ORDER — BENZONATATE 100 MG PO CAPS: 100.0000 mg | ORAL_CAPSULE | Freq: Two times a day (BID) | ORAL | 0 refills | Status: AC | PRN

## 2020-09-12 NOTE — Progress Notes (Signed)
Virtual Visit via Video Note I connected with Paul Rivers on 09/12/20 by a video enabled telemedicine application and verified that I am speaking with the correct person using two identifiers.  Location patient: home Location provider:work office Persons participating in the virtual visit: patient, provider  I discussed the limitations of evaluation and management by telemedicine and the availability of in person appointments. The patient expressed understanding and agreed to proceed.  Chief Complaint  Patient presents with  . Covid Positive   HPI: Paul Rivers is a 40 year old male with history of DM 2, hyperlipidemia, hypertension, and CKD who was recently diagnosed with COVID-19 infection. No COVID 19 vaccination.  He started with symptoms on 07/07/21. COVID-19 testing done on 09/10/2020. 6 days ago he started with fever 101, he has had max temperature 102 F. Last time temp was check was yesterday, 100 F. Marland Kitchen Nonproductive cough, interfering with sleep. + Fatigue, decreased appetite,nasal congestion, rhinorrhea, postnasal drainage. Negative for sore throat, anosmia and ageusia. He has not noted dyspnea, wheezing, abdominal pain, nausea, vomiting, changes in bowel habits, or skin rash. + Body aches, lower back pain.  He has not OTC medication, he is taking vitamins.  Resistant hypertension: He has not checked BP in about a week. Currently he is on spironolactone 25 mg daily, hydralazine 50 mg 3 times daily, carvedilol 25 mg twice daily, and amlodipine 10 mg daily. He denies having any headache, visual changes, CP, palpitation, dizziness, or diaphoresis. Negative for gross hematuria, foamy urine, or decreased urine output.  Lab Results  Component Value Date   CREATININE 1.97 (H) 12/07/2019   BUN 26 (H) 12/07/2019   NA 136 12/07/2019   K 3.8 12/07/2019   CL 101 12/07/2019   CO2 25 12/07/2019   DM2: He is not checking BS. He is on nonpharmacologic treatment.  Lab Results   Component Value Date   HGBA1C 6.3 (H) 06/04/2019   ROS: See pertinent positives and negatives per HPI.  Past Medical History:  Diagnosis Date  . Diabetes mellitus without complication ([place])   . Hyperlipidemia   . Hypertension    History reviewed. No pertinent surgical history.  Family History  Problem Relation Age of Onset  . Diabetes Mother   . Hypertension Mother   . Diabetes Father   . Hypertension Father   . Hypertension Maternal Grandmother     Social History   Socioeconomic History  . Marital status: Married    Spouse name: Not on file  . Number of children: 3  . Years of education: 44  . Highest education level: Not on file  Occupational History  . Occupation: Security  Tobacco Use  . Smoking status: Never Smoker  . Smokeless tobacco: Never Used  Vaping Use  . Vaping Use: Never used  Substance and Sexual Activity  . Alcohol use: Yes    Comment: Rarely  . Drug use: No  . Sexual activity: Not on file  Other Topics Concern  . Not on file  Social History Narrative   Fun/hobby: Keeping up with children (6, 4, 14)   Social Determinants of Radio broadcast assistant Strain: Not on file  Food Insecurity: Not on file  Transportation Needs: Not on file  Physical Activity: Not on file  Stress: Not on file  Social Connections: Not on file  Intimate Partner Violence: Not on file    Current Outpatient Medications:  .  amLODipine (NORVASC) 10 MG tablet, Take 1 tablet (10 mg total) by mouth daily., Disp: 90  tablet, Rfl: 3 .  Blood Glucose Monitoring Suppl (ACCU-CHEK AVIVA CONNECT) w/Device KIT, 1 Device by Does not apply route daily., Disp: 1 kit, Rfl: 0 .  carvedilol (COREG) 25 MG tablet, Take 1 tablet by mouth twice daily, Disp: 60 tablet, Rfl: 0 .  hydrALAZINE (APRESOLINE) 50 MG tablet, TAKE 1 TABLET BY MOUTH THREE TIMES DAILY, Disp: 90 tablet, Rfl: 0 .  ondansetron (ZOFRAN) 4 MG tablet, Take 1 tablet (4 mg total) by mouth every 6 (six) hours as needed for  nausea., Disp: 20 tablet, Rfl: 0 .  pantoprazole (PROTONIX) 40 MG tablet, Take 1 tablet (40 mg total) by mouth daily., Disp: 30 tablet, Rfl: 3 .  rosuvastatin (CRESTOR) 10 MG tablet, Take 1 tablet (10 mg total) by mouth daily., Disp: 90 tablet, Rfl: 3 .  spironolactone (ALDACTONE) 25 MG tablet, Take 1 tablet (25 mg total) by mouth daily., Disp: 90 tablet, Rfl: 2 .  Vitamin D, Ergocalciferol, (DRISDOL) 1.25 MG (50000 UT) CAPS capsule, Take 1 capsule (50,000 Units total) by mouth every 7 (seven) days., Disp: 12 capsule, Rfl: 0  EXAM:  VITALS per patient if applicable:Temp (!) 867 F (38.3 C) (Oral)   Ht 5' 3" (1.6 m)   BMI 40.30 kg/m   GENERAL: alert, oriented, appears well and in no acute distress  HEENT: atraumatic, conjunctiva clear, no obvious abnormalities on inspection of external nose and ears  NECK: normal movements of the head and neck  LUNGS: on inspection no signs of respiratory distress, breathing rate appears normal, no obvious gross SOB, gasping or wheezing  CV: no obvious cyanosis  MS: moves all visible extremities without noticeable abnormality  PSYCH/NEURO: pleasant and cooperative, no obvious depression or anxiety, speech and thought processing grossly intact  ASSESSMENT AND PLAN:  Discussed the following assessment and plan:  COVID-19 virus infection - Plan: Ambulatory referral for Covid Treatment We discussed Dx, prognosis,and treatment options. History and observation on video today to suggest a serious complication at this time. If certainly has several risk factors for complications. Benzonatate low dose for cough was recommended. Tylenol 500 mg 3-4 times per day as needed for fever. Initially reluctant to consider monoclonal IV antibiotics but later during visit states that he is open to consider it. Referral placed. Clearly instructed about warning signs.  Resistant hypertension Asked him to check BP during visit: 225/140 and 216/138. He is  completely asymptomatic. He has had similar BP readings a few months ago. Avoid cold meds.  Explained that I do not think he needs to go to the ER now but he has to be very vigilant about any type of symptom.  Even slight headache, any visual changes, any chest discomfort, or changes in breathing should prompt calling 911. Strongly recommend compliance with medications and monitoring BP regularly.  I discussed the assessment and treatment plan with the patient. The patient was provided an opportunity to ask questions and all were answered. The patient agreed with the plan and demonstrated an understanding of the instructions.  Return in about 1 week (around 09/19/2020) for HTN.  Pax Reasoner Martinique, MD

## 2020-09-13 ENCOUNTER — Telehealth: Payer: Self-pay | Admitting: *Deleted

## 2020-09-13 NOTE — Telephone Encounter (Signed)
Called to discuss with patient about COVID-19 symptoms and the use of one of the available treatments for those with mild to moderate Covid symptoms and at a high risk of hospitalization.  Pt appears to qualify for outpatient treatment due to co-morbid conditions and/or a member of an at-risk group in accordance with the FDA Emergency Use Authorization.   Battling fatigue and a cough.     Symptom onset:09/06/20  Today is day 8 of symptoms Vaccinated:  Booster?  Immunocompromised?  Qualifiers:  Unable to reach pt -  Ranata Laughery, Gillis Ends

## 2020-09-21 NOTE — Progress Notes (Signed)
Patient scheduled for a MyChart visit on 09/24/2020 at 12 PM

## 2020-09-24 ENCOUNTER — Encounter: Payer: Self-pay | Admitting: Family Medicine

## 2020-09-24 ENCOUNTER — Telehealth: Payer: Commercial Managed Care - PPO | Admitting: Family Medicine

## 2020-09-26 ENCOUNTER — Encounter: Payer: Self-pay | Admitting: Family Medicine

## 2020-09-26 ENCOUNTER — Telehealth (INDEPENDENT_AMBULATORY_CARE_PROVIDER_SITE_OTHER): Payer: Commercial Managed Care - PPO | Admitting: Family Medicine

## 2020-09-26 VITALS — Ht 63.0 in

## 2020-09-26 DIAGNOSIS — E1169 Type 2 diabetes mellitus with other specified complication: Secondary | ICD-10-CM

## 2020-09-26 DIAGNOSIS — I10 Essential (primary) hypertension: Secondary | ICD-10-CM | POA: Diagnosis not present

## 2020-09-26 DIAGNOSIS — N1832 Chronic kidney disease, stage 3b: Secondary | ICD-10-CM | POA: Diagnosis not present

## 2020-09-26 DIAGNOSIS — E559 Vitamin D deficiency, unspecified: Secondary | ICD-10-CM

## 2020-09-26 DIAGNOSIS — E1122 Type 2 diabetes mellitus with diabetic chronic kidney disease: Secondary | ICD-10-CM

## 2020-09-26 DIAGNOSIS — E785 Hyperlipidemia, unspecified: Secondary | ICD-10-CM

## 2020-09-26 MED ORDER — CARVEDILOL 25 MG PO TABS
25.0000 mg | ORAL_TABLET | Freq: Two times a day (BID) | ORAL | 2 refills | Status: DC
Start: 2020-09-26 — End: 2021-06-17

## 2020-09-26 MED ORDER — HYDRALAZINE HCL 50 MG PO TABS: 50.0000 mg | ORAL_TABLET | Freq: Three times a day (TID) | ORAL | 2 refills | Status: DC

## 2020-09-26 MED ORDER — ROSUVASTATIN CALCIUM 10 MG PO TABS: 10.0000 mg | ORAL_TABLET | Freq: Every day | ORAL | 2 refills | Status: DC

## 2020-09-26 NOTE — Assessment & Plan Note (Signed)
Probably has been adequately controlled. Continue nonpharmacologic treatment. He is overdue for eye exam. Further recommendations according to A1c results.

## 2020-09-26 NOTE — Assessment & Plan Note (Signed)
We discussed benefits of statin medication, he agrees with resuming Crestor 10 mg daily.

## 2020-09-26 NOTE — Assessment & Plan Note (Signed)
Currently he is not on vitamin D supplementation. Further recommendation according to 25 OH vitamin D. Lab appointment will be scheduled.

## 2020-09-26 NOTE — Progress Notes (Signed)
Virtual Visit via Video Note I connected with Paul Rivers on 09/26/20 by a video enabled telemedicine application and verified that I am speaking with the correct person using two identifiers.  Location patient: home Location provider:work office Persons participating in the virtual visit: patient, provider  I discussed the limitations of evaluation and management by telemedicine and the availability of in person appointments. The patient expressed understanding and agreed to proceed.   HPI: Paul Rivers is a 40 yo male with Hx of DM 2, resistant hypertension, CKD 3, and hyperlipidemia following on his last visit. He was last seen on 09/12/2020, virtual visit, with COVID-19 infection.  She was referred for monoclonal antibody infusion and according to patient, he was told he did not meet criteria.  Most symptoms have resolved, still with low appetite. Negative for fever, chills, sore throat, anosmia, ageusia, cough, wheezing, changes in bowel habits, body aches, or skin rash.  He has been checking BP regularly. Home bps this weekend 130s/low 90s. He is on amlodipine 10 mg daily, carvedilol 25 mg twice daily, and hydralazine 50 mg 3 times daily. He is not taking spironolactone.  He denies headache, visual changes, CP, dyspnea, palpitations, focal deficit, grosshematuria,foam in urine, decreased urine output, or edema. Requesting referral to nephrologist. He found a letter from nephrologist office, which he received a few months ago, did not re-schedule appointment.  Lab Results  Component Value Date   CREATININE 1.97 (H) 12/07/2019   BUN 26 (H) 12/07/2019   NA 136 12/07/2019   K 3.8 12/07/2019   CL 101 12/07/2019   CO2 25 12/07/2019   Vitamin D deficiency: Currently he is not on vitamin D supplementation. 25 OH vitamin D on 09/02/2019 was low at 12.9.  DM2: He is not checking BS. Currently he is on nonpharmacologic treatment. Denies abdominal pain, nausea,vomiting, polydipsia,polyuria, or  polyphagia.  Hyperlipidemia: He is not taking Crestor 10 mg daily. Negative for side effects.  ROS: See pertinent positives and negatives per HPI.  Past Medical History:  Diagnosis Date  . Diabetes mellitus without complication ([place])   . Hyperlipidemia   . Hypertension    History reviewed. No pertinent surgical history.  Family History  Problem Relation Age of Onset  . Diabetes Mother   . Hypertension Mother   . Diabetes Father   . Hypertension Father   . Hypertension Maternal Grandmother     Social History   Socioeconomic History  . Marital status: Married    Spouse name: Not on file  . Number of children: 3  . Years of education: 44  . Highest education level: Not on file  Occupational History  . Occupation: Security  Tobacco Use  . Smoking status: Never Smoker  . Smokeless tobacco: Never Used  Vaping Use  . Vaping Use: Never used  Substance and Sexual Activity  . Alcohol use: Yes    Comment: Rarely  . Drug use: No  . Sexual activity: Not on file  Other Topics Concern  . Not on file  Social History Narrative   Fun/hobby: Keeping up with children (6, 4, 14)   Social Determinants of Radio broadcast assistant Strain: Not on file  Food Insecurity: Not on file  Transportation Needs: Not on file  Physical Activity: Not on file  Stress: Not on file  Social Connections: Not on file  Intimate Partner Violence: Not on file    Current Outpatient Medications:  .  amLODipine (NORVASC) 10 MG tablet, Take 1 tablet (10 mg total)  by mouth daily., Disp: 90 tablet, Rfl: 3 .  Blood Glucose Monitoring Suppl (ACCU-CHEK AVIVA CONNECT) w/Device KIT, 1 Device by Does not apply route daily., Disp: 1 kit, Rfl: 0 .  ondansetron (ZOFRAN) 4 MG tablet, Take 1 tablet (4 mg total) by mouth every 6 (six) hours as needed for nausea., Disp: 20 tablet, Rfl: 0 .  pantoprazole (PROTONIX) 40 MG tablet, Take 1 tablet (40 mg total) by mouth daily., Disp: 30 tablet, Rfl: 3 .  carvedilol  (COREG) 25 MG tablet, Take 1 tablet (25 mg total) by mouth 2 (two) times daily., Disp: 180 tablet, Rfl: 2 .  hydrALAZINE (APRESOLINE) 50 MG tablet, Take 1 tablet (50 mg total) by mouth 3 (three) times daily., Disp: 90 tablet, Rfl: 2 .  rosuvastatin (CRESTOR) 10 MG tablet, Take 1 tablet (10 mg total) by mouth daily., Disp: 90 tablet, Rfl: 2  EXAM:  VITALS per patient if applicable:Ht '5\' 3"'  (1.6 m)   BMI 40.30 kg/m   GENERAL: alert, oriented, appears well and in no acute distress  HEENT: atraumatic, conjunctiva clear, no obvious abnormalities on inspection.  NECK: normal movements of the head and neck  LUNGS: on inspection no signs of respiratory distress, breathing rate appears normal, no obvious gross SOB, gasping or wheezing  CV: no obvious cyanosis  MS: moves all visible extremities without noticeable abnormality  PSYCH/NEURO: pleasant and cooperative, no obvious depression or anxiety, speech and thought processing grossly intact  ASSESSMENT AND PLAN:  Discussed the following assessment and plan: Orders Placed This Encounter  Procedures  . Basic metabolic panel  . VITAMIN D 25 Hydroxy (Vit-D Deficiency, Fractures)  . Hemoglobin A1c  . Fructosamine  . Ambulatory referral to Nephrology    Vitamin D deficiency, unspecified Currently he is not on vitamin D supplementation. Further recommendation according to 25 OH vitamin D. Lab appointment will be scheduled.  Type II diabetes mellitus with renal manifestations ([facility]) Probably has been adequately controlled. Continue nonpharmacologic treatment. He is overdue for eye exam. Further recommendations according to A1c results.  Resistant hypertension BP still not at goal but much better controlled. Continue amlodipine 10 mg daily, carvedilol 25 mg twice daily, hydralazine 50 mg 3 times daily. Continue monitoring BP regularly. We discussed possible complications of elevated BP. Low-salt diet.   CKD (chronic kidney  disease), stage III Will place in a referral for nephrologist. Continue low salt diet. Avoid NSAIDs. Adequate hydration. Adequate BP and glucose control.  Hyperlipidemia associated with type 2 diabetes mellitus ([place]) We discussed benefits of statin medication, he agrees with resuming Crestor 10 mg daily.  We discussed possible serious and likely etiologies, options for evaluation and workup, limitations of telemedicine visit vs in person visit, treatment, treatment risks and precautions.  I discussed the assessment and treatment plan with the patient. Paul Rivers was provided an opportunity to ask questions and all were answered. He agreed with the plan and demonstrated an understanding of the instructions.  Return in about 4 months (around 01/24/2021) for Lab appt..  Betty Martinique, MD

## 2020-09-26 NOTE — Assessment & Plan Note (Signed)
BP still not at goal but much better controlled. Continue amlodipine 10 mg daily, carvedilol 25 mg twice daily, hydralazine 50 mg 3 times daily. Continue monitoring BP regularly. We discussed possible complications of elevated BP. Low-salt diet.

## 2020-09-26 NOTE — Assessment & Plan Note (Signed)
Will place in a referral for nephrologist. Continue low salt diet. Avoid NSAIDs. Adequate hydration. Adequate BP and glucose control.

## 2020-09-27 ENCOUNTER — Other Ambulatory Visit: Payer: Commercial Managed Care - PPO

## 2020-10-04 ENCOUNTER — Other Ambulatory Visit: Payer: Self-pay

## 2020-10-04 ENCOUNTER — Other Ambulatory Visit (INDEPENDENT_AMBULATORY_CARE_PROVIDER_SITE_OTHER): Payer: Commercial Managed Care - PPO

## 2020-10-04 DIAGNOSIS — E1122 Type 2 diabetes mellitus with diabetic chronic kidney disease: Secondary | ICD-10-CM

## 2020-10-04 DIAGNOSIS — E559 Vitamin D deficiency, unspecified: Secondary | ICD-10-CM

## 2020-10-04 DIAGNOSIS — N1832 Chronic kidney disease, stage 3b: Secondary | ICD-10-CM

## 2020-10-04 DIAGNOSIS — I10 Essential (primary) hypertension: Secondary | ICD-10-CM

## 2020-10-04 LAB — BASIC METABOLIC PANEL
BUN: 19 mg/dL (ref 6–23)
CO2: 26 mEq/L (ref 19–32)
Calcium: 8.8 mg/dL (ref 8.4–10.5)
Chloride: 104 mEq/L (ref 96–112)
Creatinine, Ser: 1.9 mg/dL — ABNORMAL HIGH (ref 0.40–1.50)
GFR: 44.23 mL/min — ABNORMAL LOW (ref >60.00)
Glucose, Bld: 133 mg/dL — ABNORMAL HIGH (ref 70–99)
Potassium: 4 mEq/L (ref 3.5–5.1)
Sodium: 138 mEq/L (ref 135–145)

## 2020-10-04 LAB — HEMOGLOBIN A1C: Hgb A1c MFr Bld: 6.6 % — ABNORMAL HIGH (ref 4.6–6.5)

## 2020-10-04 LAB — VITAMIN D 25 HYDROXY (VIT D DEFICIENCY, FRACTURES): VITD: 29.59 ng/mL — ABNORMAL LOW (ref 30.00–100.00)

## 2020-10-10 LAB — FRUCTOSAMINE: Fructosamine: 245 umol/L (ref 205–285)

## 2021-05-05 ENCOUNTER — Other Ambulatory Visit: Payer: Self-pay | Admitting: Family Medicine

## 2021-05-05 DIAGNOSIS — I10 Essential (primary) hypertension: Secondary | ICD-10-CM

## 2021-06-04 NOTE — Progress Notes (Signed)
canceled

## 2021-06-16 ENCOUNTER — Other Ambulatory Visit: Payer: Self-pay | Admitting: Family Medicine

## 2021-06-16 DIAGNOSIS — I10 Essential (primary) hypertension: Secondary | ICD-10-CM

## 2021-07-06 ENCOUNTER — Other Ambulatory Visit: Payer: Self-pay | Admitting: Family Medicine

## 2021-07-06 DIAGNOSIS — I10 Essential (primary) hypertension: Secondary | ICD-10-CM

## 2021-09-04 ENCOUNTER — Other Ambulatory Visit: Payer: Self-pay

## 2021-09-04 ENCOUNTER — Emergency Department (HOSPITAL_COMMUNITY): Payer: Commercial Managed Care - PPO

## 2021-09-04 ENCOUNTER — Encounter: Payer: Self-pay | Admitting: Family Medicine

## 2021-09-04 ENCOUNTER — Encounter (HOSPITAL_COMMUNITY): Payer: Self-pay | Admitting: Internal Medicine

## 2021-09-04 ENCOUNTER — Inpatient Hospital Stay (HOSPITAL_COMMUNITY)
Admission: EM | Admit: 2021-09-04 | Discharge: 2021-09-07 | DRG: 683 | Disposition: A | Discharge status: Home / Self Care | Payer: Commercial Managed Care - PPO | Source: Ambulatory Visit | Attending: Internal Medicine | Admitting: Internal Medicine

## 2021-09-04 ENCOUNTER — Ambulatory Visit (INDEPENDENT_AMBULATORY_CARE_PROVIDER_SITE_OTHER): Payer: Commercial Managed Care - PPO | Admitting: Family Medicine

## 2021-09-04 VITALS — BP 290/165 | HR 104 | Resp 16 | Ht 63.0 in | Wt 203.2 lb

## 2021-09-04 DIAGNOSIS — R634 Abnormal weight loss: Secondary | ICD-10-CM

## 2021-09-04 DIAGNOSIS — R1013 Epigastric pain: Secondary | ICD-10-CM | POA: Diagnosis present

## 2021-09-04 DIAGNOSIS — D72819 Decreased white blood cell count, unspecified: Secondary | ICD-10-CM | POA: Diagnosis present

## 2021-09-04 DIAGNOSIS — Z79899 Other long term (current) drug therapy: Secondary | ICD-10-CM | POA: Diagnosis not present

## 2021-09-04 DIAGNOSIS — E669 Obesity, unspecified: Secondary | ICD-10-CM | POA: Diagnosis present

## 2021-09-04 DIAGNOSIS — T465X6A Underdosing of other antihypertensive drugs, initial encounter: Secondary | ICD-10-CM | POA: Diagnosis present

## 2021-09-04 DIAGNOSIS — E785 Hyperlipidemia, unspecified: Secondary | ICD-10-CM | POA: Diagnosis present

## 2021-09-04 DIAGNOSIS — Z8249 Family history of ischemic heart disease and other diseases of the circulatory system: Secondary | ICD-10-CM | POA: Diagnosis not present

## 2021-09-04 DIAGNOSIS — K259 Gastric ulcer, unspecified as acute or chronic, without hemorrhage or perforation: Secondary | ICD-10-CM | POA: Diagnosis present

## 2021-09-04 DIAGNOSIS — N189 Chronic kidney disease, unspecified: Secondary | ICD-10-CM

## 2021-09-04 DIAGNOSIS — R361 Hematospermia: Secondary | ICD-10-CM

## 2021-09-04 DIAGNOSIS — R809 Proteinuria, unspecified: Secondary | ICD-10-CM | POA: Diagnosis present

## 2021-09-04 DIAGNOSIS — Z20822 Contact with and (suspected) exposure to covid-19: Secondary | ICD-10-CM | POA: Diagnosis present

## 2021-09-04 DIAGNOSIS — N179 Acute kidney failure, unspecified: Principal | ICD-10-CM | POA: Diagnosis present

## 2021-09-04 DIAGNOSIS — Z6835 Body mass index (BMI) 35.0-35.9, adult: Secondary | ICD-10-CM

## 2021-09-04 DIAGNOSIS — K253 Acute gastric ulcer without hemorrhage or perforation: Secondary | ICD-10-CM | POA: Diagnosis not present

## 2021-09-04 DIAGNOSIS — E876 Hypokalemia: Secondary | ICD-10-CM | POA: Diagnosis present

## 2021-09-04 DIAGNOSIS — I129 Hypertensive chronic kidney disease with stage 1 through stage 4 chronic kidney disease, or unspecified chronic kidney disease: Secondary | ICD-10-CM | POA: Diagnosis present

## 2021-09-04 DIAGNOSIS — N1832 Chronic kidney disease, stage 3b: Secondary | ICD-10-CM | POA: Diagnosis present

## 2021-09-04 DIAGNOSIS — E1122 Type 2 diabetes mellitus with diabetic chronic kidney disease: Secondary | ICD-10-CM | POA: Diagnosis present

## 2021-09-04 DIAGNOSIS — K219 Gastro-esophageal reflux disease without esophagitis: Secondary | ICD-10-CM | POA: Diagnosis present

## 2021-09-04 DIAGNOSIS — K2970 Gastritis, unspecified, without bleeding: Secondary | ICD-10-CM | POA: Diagnosis present

## 2021-09-04 DIAGNOSIS — T461X6A Underdosing of calcium-channel blockers, initial encounter: Secondary | ICD-10-CM | POA: Diagnosis present

## 2021-09-04 DIAGNOSIS — T447X6A Underdosing of beta-adrenoreceptor antagonists, initial encounter: Secondary | ICD-10-CM | POA: Diagnosis present

## 2021-09-04 DIAGNOSIS — I160 Hypertensive urgency: Secondary | ICD-10-CM

## 2021-09-04 DIAGNOSIS — Z833 Family history of diabetes mellitus: Secondary | ICD-10-CM

## 2021-09-04 DIAGNOSIS — I161 Hypertensive emergency: Secondary | ICD-10-CM | POA: Diagnosis present

## 2021-09-04 DIAGNOSIS — Z91138 Patient's unintentional underdosing of medication regimen for other reason: Secondary | ICD-10-CM | POA: Diagnosis not present

## 2021-09-04 DIAGNOSIS — R9431 Abnormal electrocardiogram [ECG] [EKG]: Secondary | ICD-10-CM | POA: Diagnosis present

## 2021-09-04 DIAGNOSIS — E1169 Type 2 diabetes mellitus with other specified complication: Secondary | ICD-10-CM

## 2021-09-04 DIAGNOSIS — R350 Frequency of micturition: Secondary | ICD-10-CM

## 2021-09-04 LAB — POCT GLYCOSYLATED HEMOGLOBIN (HGB A1C): HbA1c, POC (prediabetic range): 6 % (ref 5.7–6.4)

## 2021-09-04 LAB — COMPREHENSIVE METABOLIC PANEL
ALT: 22 U/L (ref 0–44)
AST: 28 U/L (ref 15–41)
Albumin: 4.1 g/dL (ref 3.5–5.0)
Alkaline Phosphatase: 67 U/L (ref 38–126)
Anion gap: 11 (ref 5–15)
BUN: 26 mg/dL — ABNORMAL HIGH (ref 6–20)
CO2: 30 mmol/L (ref 22–32)
Calcium: 9.1 mg/dL (ref 8.9–10.3)
Chloride: 95 mmol/L — ABNORMAL LOW (ref 98–111)
Creatinine, Ser: 3.29 mg/dL — ABNORMAL HIGH (ref 0.61–1.24)
GFR, Estimated: 24 mL/min — ABNORMAL LOW (ref >60)
Glucose, Bld: 105 mg/dL — ABNORMAL HIGH (ref 70–99)
Potassium: 2.7 mmol/L — CL (ref 3.5–5.1)
Sodium: 136 mmol/L (ref 135–145)
Total Bilirubin: 0.8 mg/dL (ref 0.3–1.2)
Total Protein: 7.7 g/dL (ref 6.5–8.1)

## 2021-09-04 LAB — URINALYSIS, ROUTINE W REFLEX MICROSCOPIC
Bilirubin Urine: NEGATIVE
Glucose, UA: NEGATIVE mg/dL
Ketones, ur: NEGATIVE mg/dL
Leukocytes,Ua: NEGATIVE
Nitrite: NEGATIVE
Protein, ur: >300 mg/dL — AB
Specific Gravity, Urine: 1.02 (ref 1.005–1.030)
pH: 7 (ref 5.0–8.0)

## 2021-09-04 LAB — CBC WITH DIFFERENTIAL/PLATELET
Abs Immature Granulocytes: 0.02 10*3/uL (ref 0.00–0.07)
Basophils Absolute: 0.1 10*3/uL (ref 0.0–0.1)
Basophils Relative: 2 %
Eosinophils Absolute: 0 10*3/uL (ref 0.0–0.5)
Eosinophils Relative: 1 %
HCT: 48.8 % (ref 39.0–52.0)
Hemoglobin: 16.3 g/dL (ref 13.0–17.0)
Immature Granulocytes: 1 %
Lymphocytes Relative: 22 %
Lymphs Abs: 0.8 10*3/uL (ref 0.7–4.0)
MCH: 27.5 pg (ref 26.0–34.0)
MCHC: 33.4 g/dL (ref 30.0–36.0)
MCV: 82.3 fL (ref 80.0–100.0)
Monocytes Absolute: 0.5 10*3/uL (ref 0.1–1.0)
Monocytes Relative: 14 %
Neutro Abs: 2.2 10*3/uL (ref 1.7–7.7)
Neutrophils Relative %: 60 %
Platelets: 257 10*3/uL (ref 150–400)
RBC: 5.93 MIL/uL — ABNORMAL HIGH (ref 4.22–5.81)
RDW: 12.7 % (ref 11.5–15.5)
WBC: 3.7 10*3/uL — ABNORMAL LOW (ref 4.0–10.5)
nRBC: 0 % (ref 0.0–0.2)

## 2021-09-04 LAB — LIPASE, BLOOD: Lipase: 36 U/L (ref 11–51)

## 2021-09-04 LAB — MAGNESIUM: Magnesium: 2.3 mg/dL (ref 1.7–2.4)

## 2021-09-04 LAB — URINALYSIS, MICROSCOPIC (REFLEX): Bacteria, UA: NONE SEEN

## 2021-09-04 LAB — SODIUM, URINE, RANDOM: Sodium, Ur: 40 mmol/L

## 2021-09-04 LAB — RESP PANEL BY RT-PCR (FLU A&B, COVID) ARPGX2
Influenza A by PCR: NEGATIVE
Influenza B by PCR: NEGATIVE
SARS Coronavirus 2 by RT PCR: NEGATIVE

## 2021-09-04 LAB — PROTEIN, URINE, RANDOM: Total Protein, Urine: 774 mg/dL

## 2021-09-04 LAB — CREATININE, URINE, RANDOM: Creatinine, Urine: 180.89 mg/dL

## 2021-09-04 MED ORDER — LABETALOL HCL 5 MG/ML IV SOLN
20.0000 mg | Freq: Once | INTRAVENOUS | Status: AC
  Administered 2021-09-04: 20 mg via INTRAVENOUS
  Filled 2021-09-04: qty 4

## 2021-09-04 MED ORDER — HYDRALAZINE HCL 50 MG PO TABS
50.0000 mg | ORAL_TABLET | Freq: Three times a day (TID) | ORAL | Status: DC
  Administered 2021-09-04 – 2021-09-05 (×3): 50 mg via ORAL
  Filled 2021-09-04: qty 1
  Filled 2021-09-04: qty 2
  Filled 2021-09-04: qty 1

## 2021-09-04 MED ORDER — HEPARIN SODIUM (PORCINE) 5000 UNIT/ML IJ SOLN
5000.0000 [IU] | Freq: Three times a day (TID) | INTRAMUSCULAR | Status: AC
  Administered 2021-09-04 – 2021-09-06 (×8): 5000 [IU] via SUBCUTANEOUS
  Filled 2021-09-04 (×8): qty 1

## 2021-09-04 MED ORDER — METHYLPREDNISOLONE SODIUM SUCC 40 MG IJ SOLR: 40.0000 mg | Freq: Two times a day (BID) | INTRAMUSCULAR | Status: DC

## 2021-09-04 MED ORDER — POTASSIUM CHLORIDE CRYS ER 20 MEQ PO TBCR
40.0000 meq | EXTENDED_RELEASE_TABLET | Freq: Once | ORAL | Status: AC
Start: 2021-09-04 — End: 2021-09-04
  Administered 2021-09-04: 40 meq via ORAL
  Filled 2021-09-04: qty 2

## 2021-09-04 MED ORDER — PANTOPRAZOLE SODIUM 40 MG PO TBEC
40.0000 mg | DELAYED_RELEASE_TABLET | Freq: Every day | ORAL | Status: DC
  Administered 2021-09-04: 40 mg via ORAL
  Filled 2021-09-04: qty 1

## 2021-09-04 MED ORDER — POTASSIUM CHLORIDE CRYS ER 20 MEQ PO TBCR
20.0000 meq | EXTENDED_RELEASE_TABLET | ORAL | Status: AC
  Administered 2021-09-04: 20 meq via ORAL
  Filled 2021-09-04: qty 1

## 2021-09-04 MED ORDER — ACETAMINOPHEN 325 MG PO TABS: 650.0000 mg | ORAL_TABLET | Freq: Four times a day (QID) | ORAL | Status: DC | PRN

## 2021-09-04 MED ORDER — ONDANSETRON HCL 4 MG PO TABS: 4.0000 mg | ORAL_TABLET | Freq: Four times a day (QID) | ORAL | Status: DC | PRN

## 2021-09-04 MED ORDER — HYDRALAZINE HCL 20 MG/ML IJ SOLN
10.0000 mg | INTRAMUSCULAR | Status: DC | PRN
  Administered 2021-09-04 – 2021-09-05 (×4): 10 mg via INTRAVENOUS
  Filled 2021-09-04 (×4): qty 1

## 2021-09-04 MED ORDER — SODIUM CHLORIDE 0.9% FLUSH
3.0000 mL | Freq: Two times a day (BID) | INTRAVENOUS | Status: DC
  Administered 2021-09-04 – 2021-09-06 (×6): 3 mL via INTRAVENOUS

## 2021-09-04 MED ORDER — POTASSIUM CHLORIDE 10 MEQ/100ML IV SOLN
10.0000 meq | Freq: Once | INTRAVENOUS | Status: AC
  Administered 2021-09-04: 10 meq via INTRAVENOUS
  Filled 2021-09-04: qty 100

## 2021-09-04 MED ORDER — SODIUM CHLORIDE 0.9 % IV SOLN: INTRAVENOUS | Status: DC

## 2021-09-04 MED ORDER — AMLODIPINE BESYLATE 10 MG PO TABS
10.0000 mg | ORAL_TABLET | Freq: Every day | ORAL | Status: DC
  Administered 2021-09-04 – 2021-09-07 (×4): 10 mg via ORAL
  Filled 2021-09-04 (×3): qty 1
  Filled 2021-09-04: qty 2

## 2021-09-04 MED ORDER — ACETAMINOPHEN 650 MG RE SUPP: 650.0000 mg | Freq: Four times a day (QID) | RECTAL | Status: DC | PRN

## 2021-09-04 MED ORDER — CARVEDILOL 25 MG PO TABS
25.0000 mg | ORAL_TABLET | Freq: Two times a day (BID) | ORAL | Status: DC
  Administered 2021-09-05 – 2021-09-07 (×5): 25 mg via ORAL
  Filled 2021-09-04 (×5): qty 1

## 2021-09-04 MED ORDER — ONDANSETRON HCL 4 MG/2ML IJ SOLN: 4.0000 mg | Freq: Four times a day (QID) | INTRAMUSCULAR | Status: DC | PRN

## 2021-09-04 MED ORDER — SUCRALFATE 1 GM/10ML PO SUSP
1.0000 g | Freq: Three times a day (TID) | ORAL | Status: DC
  Administered 2021-09-04 – 2021-09-06 (×6): 1 g via ORAL
  Filled 2021-09-04 (×6): qty 10

## 2021-09-04 NOTE — H&P (Addendum)
History and Physical    Paul Rivers KZS:[id number] DOB: [birth year] DOA: 09/04/2021  Referring MD/NP/PA: Elnora Morrison, MD PCP: Martinique, Betty G, MD  Patient coming from: Primary care provider office  Chief Complaint: Elevated blood pressure  I have personally briefly reviewed patient's old medical records in [place]   HPI: MARK HASSEY is a 41 y.o. male with medical history significant of hypertension, hyperlipidemia, diabetes mellitus type 2, and obesity who presents after being advised to the emergency department for elevated blood pressure.  Patient notes that he had gone to see his primary care provider today due to epigastric abdominal pain.  He reports that over the last month he has had decreased appetite and lost 20 to 30 pounds due to his symptoms.  Anytime that he tries to eat it seems to worsen epigastric discomfort and was nauseous.  Denies having any episodes of vomiting.  He thought symptoms may have been secondary to his blood pressure medications and therefore stopped them over the last 2 weeks.  However, patient noted that the abdominal pain did not stop.  Patient reports that he felt that his medications of hydralazine had caused headaches previously.  He has had increasing urinary frequency.  Denies any NSAID use, alcohol use, chest pain, dysuria, fever, chest pain, or change in vision.  ED Course: Upon admission into the emergency department patient was seen to be afebrile, heart rate 81-1 04, respirations 16-22, blood pressure elevated up to 290/165, and O2 saturation maintained on room air.  Labs significant for WBC 3.7, potassium 2.7, BUN 26, creatinine 3.29, and lipase 36.  Urinalysis positive for small hemoglobin, greater than 300 protein, no significant bacteria.  Renal CT noted no acute abdominal pelvic findings or evidence of obstructive uropathy.  Patient had been given total of labetalol 40 mg IV, potassium chloride 40 mEq p.o., and potassium chloride 10  mEq IV.   Review of Systems  Constitutional:  Positive for weight loss. Negative for fever.  HENT:  Negative for nosebleeds.   Eyes:  Negative for double vision and photophobia.  Respiratory:  Negative for cough and shortness of breath.   Cardiovascular:  Negative for chest pain and leg swelling.  Gastrointestinal:  Positive for abdominal pain and nausea. Negative for vomiting.  Genitourinary:  Positive for frequency. Negative for dysuria.  Musculoskeletal:  Negative for falls.  Skin:  Negative for rash.  Neurological:  Negative for loss of consciousness.  Psychiatric/Behavioral:  Negative for substance abuse.    Past Medical History:  Diagnosis Date   Diabetes mellitus without complication ([place])    Hyperlipidemia    Hypertension     History reviewed. No pertinent surgical history.   reports that he has never smoked. He has never used smokeless tobacco. He reports current alcohol use. He reports that he does not use drugs.  No Known Allergies  Family History  Problem Relation Age of Onset   Diabetes Mother    Hypertension Mother    Diabetes Father    Hypertension Father    Hypertension Maternal Grandmother     Prior to Admission medications   Medication Sig Start Date End Date Taking? Authorizing Provider  amLODipine (NORVASC) 10 MG tablet Take 1 tablet (10 mg total) by mouth daily. 12/07/19  Yes Martinique, Betty G, MD  carvedilol (COREG) 25 MG tablet Take 1 tablet by mouth twice daily Patient taking differently: Take 25 mg by mouth 2 (two) times daily with a meal. 06/17/21  Yes Martinique, Betty G,  MD  hydrALAZINE (APRESOLINE) 50 MG tablet TAKE 1 TABLET BY MOUTH THREE TIMES DAILY Patient taking differently: Take 50 mg by mouth 3 (three) times daily. 07/08/21  Yes Martinique, Betty G, MD  Blood Glucose Monitoring Suppl (ACCU-CHEK AVIVA CONNECT) w/Device KIT 1 Device by Does not apply route daily. 07/04/19   Martinique, Betty G, MD  rosuvastatin (CRESTOR) 10 MG tablet Take 1 tablet (10 mg  total) by mouth daily. Patient not taking: Reported on 09/04/2021 09/26/20   Martinique, Betty G, MD    Physical Exam:  Constitutional: Young male currently in no acute distress Vitals:   09/04/21 1000 09/04/21 1145 09/04/21 1223 09/04/21 1230  BP: (!) 266/182 (!) 239/158 (!) 224/128 (!) 225/160  Pulse: 98 88  81  Resp: 16 17  (!) 22  Temp: 98.7 F (37.1 C)     TempSrc: Oral     SpO2: 99% 96%  97%   Eyes: PERRL, lids and conjunctivae normal ENMT: Mucous membranes are moist. Posterior pharynx clear of any exudate or lesions.  Neck: normal, supple, no masses, no thyromegaly Respiratory: clear to auscultation bilaterally, no wheezing, no crackles. Normal respiratory effort. No accessory muscle use.  Cardiovascular: Regular rate and rhythm, no murmurs / rubs / gallops. No extremity edema. 2+ pedal pulses. No carotid bruits.  Abdomen: no tenderness, no masses palpated. No hepatosplenomegaly. Bowel sounds positive.  Musculoskeletal: no clubbing / cyanosis. No joint deformity upper and lower extremities.  Skin: no rashes, lesions, ulcers. No induration Neurologic: CN 2-12 grossly intact. Sensation intact, DTR normal. Strength 5/5 in all 4.  Psychiatric: Normal judgment and insight. Alert and oriented x 3. Normal mood.     Labs on Admission: I have personally reviewed following labs and imaging studies  CBC: Recent Labs  Lab 09/04/21 1009  WBC 3.7*  NEUTROABS 2.2  HGB 16.3  HCT 48.8  MCV 82.3  PLT 814   Basic Metabolic Panel: Recent Labs  Lab 09/04/21 1009  NA 136  K 2.7*  CL 95*  CO2 30  GLUCOSE 105*  BUN 26*  CREATININE 3.29*  CALCIUM 9.1   GFR: Estimated Creatinine Clearance: 30.3 mL/min (A) (by C-G formula based on SCr of 3.29 mg/dL (H)). Liver Function Tests: Recent Labs  Lab 09/04/21 1009  AST 28  ALT 22  ALKPHOS 67  BILITOT 0.8  PROT 7.7  ALBUMIN 4.1   Recent Labs  Lab 09/04/21 1009  LIPASE 36   No results for input(s): AMMONIA in the last 168  hours. Coagulation Profile: No results for input(s): INR, PROTIME in the last 168 hours. Cardiac Enzymes: No results for input(s): CKTOTAL, CKMB, CKMBINDEX, TROPONINI in the last 168 hours. BNP (last 3 results) No results for input(s): PROBNP in the last 8760 hours. HbA1C: Recent Labs    09/04/21 0920  HGBA1C 6.0   CBG: No results for input(s): GLUCAP in the last 168 hours. Lipid Profile: No results for input(s): CHOL, HDL, LDLCALC, TRIG, CHOLHDL, LDLDIRECT in the last 72 hours. Thyroid Function Tests: No results for input(s): TSH, T4TOTAL, FREET4, T3FREE, THYROIDAB in the last 72 hours. Anemia Panel: No results for input(s): VITAMINB12, FOLATE, FERRITIN, TIBC, IRON, RETICCTPCT in the last 72 hours. Urine analysis:    Component Value Date/Time   COLORURINE YELLOW 09/04/2021 1110   APPEARANCEUR CLEAR 09/04/2021 1110   LABSPEC 1.020 09/04/2021 1110   PHURINE 7.0 09/04/2021 1110   GLUCOSEU NEGATIVE 09/04/2021 1110   HGBUR SMALL (A) 09/04/2021 [place] 09/04/2021 1110  KETONESUR NEGATIVE 09/04/2021 1110   PROTEINUR >300 (A) 09/04/2021 1110   NITRITE NEGATIVE 09/04/2021 1110   LEUKOCYTESUR NEGATIVE 09/04/2021 1110   Sepsis Labs: No results found for this or any previous visit (from the past 240 hour(s)).   Radiological Exams on Admission: CT Renal Stone Study  Result Date: 09/04/2021 CLINICAL DATA:  Generalized abdominal pain. EXAM: CT ABDOMEN AND PELVIS WITHOUT CONTRAST TECHNIQUE: Multidetector CT imaging of the abdomen and pelvis was performed following the standard protocol without IV contrast. RADIATION DOSE REDUCTION: This exam was performed according to the departmental dose-optimization program which includes automated exposure control, adjustment of the mA and/or kV according to patient size and/or use of iterative reconstruction technique. COMPARISON:  None. FINDINGS: Lower chest: No acute abnormality. Hepatobiliary: Unremarkable noncontrast  appearance of the hepatic parenchyma. Gallbladder is unremarkable. No biliary ductal dilation. Pancreas: No pancreatic ductal dilation or evidence of acute inflammation. Spleen: Normal in size without focal abnormality. Adrenals/Urinary Tract: Bilateral adrenal glands are unremarkable. No hydronephrosis. No renal, ureteral or bladder calculi identified. No solid enhancing renal mass. Urinary bladder is unremarkable for degree of distension. Stomach/Bowel: No enteric contrast was administered. Stomach is mildly distended with ingested material without wall thickening. No pathologic dilation of small or large bowel. The appendix and terminal ileum appear normal. The colon is minimally distended limiting evaluation. No evidence of acute bowel inflammation. Vascular/Lymphatic: No significant vascular findings are present. No pathologically enlarged abdominal or pelvic lymph nodes. Reproductive: Prostate is unremarkable. Other: No abdominal wall hernia or abnormality. No abdominopelvic ascites. Musculoskeletal: Multilevel degenerative changes spine. Mild degenerative changes bilateral hips. No acute osseous abnormality. IMPRESSION: No acute abdominopelvic findings. Specifically, no evidence of obstructive uropathy. Electronically Signed   By: Dahlia Bailiff M.D.   On: 09/04/2021 12:11    EKG: Independently reviewed.  Normal sinus rhythm at 90 bpm with prolonged QT interval 511  Assessment/Plan Hypertensive emergency: Acute.  Patient presents with blood pressures elevated up to 290/165 on admission.  Reports of stopping home blood pressure regimen in the last 2 weeks due to complaints of abdominal discomfort.  Home regimen included amlodipine 10 mg daily, Coreg 25 mg twice daily, and hydralazine 50 mg 3 times daily.  Patient received labetalol 40 mg IV while in the ED. -Admit to a telemetry bed -Resume amlodipine, hydralazine, and Coreg -Consider increasing hydralazine dose if blood pressures do not appear to be  adequately controlled -Hydralazine IV as needed   Acute kidney injury superimposed on chronic kidney disease: Patient presents with creatinine 3.29 with BUN 26.  Baseline creatinine previously around 2.  Urinalysis was significant for proteinuria greater than 300 protein.  Suspect acute worsening could be related with uncontrolled hypertension and/or decreased p.o. intake. -Check urine protein, creatinine, and sodium -Normal saline IV fluids at 75 mL/h overnight -Recheck kidney function tomorrow -Discussed with Dr. Carolin Sicks who recommended formally consult nephrology in a.m. for further evaluation if kidney function does not appear to be improving  Epigastric acute: Patient reports having epigastric abdominal discomfort along with nausea that is worsened with eating.  Lipase was within normal limits and he denies any NSAID use. Ct scan of the abdomen did not note any acute abnormality.  Question possibility of gastritis. -Substituted Carafate for Protonix given prolonged QT interval -May warrant referral to GI in outpatient settting   Hypokalemia: Acute.  Potassium low at 2.7.  Patient has been given a total of potassium chloride 50 mEq on the ED.  This would be expected to correct potassium to  3.2. -Check magnesium level -Give an additional potassium chloride 20 mEq p.o. -Will continue to monitor and replace as needed  Prolonged QT interval: Initial QT intervals was noted to be 511. -Correct electrolyte abnormalities -Recheck EKG  Leukopenia: WBC 3.7 on admission.  No signs of infection noted on urinalysis. -Continue to monitor  Diabetes mellitus type 2: On admission glucose 105.  Last hemoglobin A1c 6.6 on 10/04/2020, but review of records shows that he had a point-of-care in his PCP office that was noted to be 6 today.  He is not on any medications for treatment. -Carb modified diet  Hyperlipidemia: Last LDL was 125 in 11/2019.  Patient reported that he never had really been on  Crestor -Recommend outpatient follow-up with PCP to resume  Weight loss: Patient reports weight loss of 20-30 lbs of the last couple weeks related to the abdominal discomfort.  Obesity: BMI 36kg/m2  DVT prophylaxis: Heparin Code Status: Full Family Communication: none requested Disposition Plan: Hopefully discharge home once medically Consults called: None Admission status: Inpatient  Norval Morton MD [facility]   If 7PM-7AM, please contact night-coverage   09/04/2021, 1:10 PM

## 2021-09-04 NOTE — ED Notes (Signed)
Called 5W to initiate purple man process. Bed ready for 1 hr 5 minutes.

## 2021-09-04 NOTE — ED Triage Notes (Signed)
Pt went to PCP for eval of generalized abdominal pain for a few weeks. Hypertensive at office with hx of same. On meds for same but d/t abdominal pain and lack of appetite has not taken his meds for 2-3 weeks.

## 2021-09-04 NOTE — ED Notes (Signed)
Per staff on 5W, room needs to be zapped by EVS.

## 2021-09-04 NOTE — ED Notes (Signed)
Rate decreased on K+ d/t pt complaint of burning to PIV.

## 2021-09-04 NOTE — ED Notes (Signed)
Pt transported to CT via stretcher at this time.  

## 2021-09-04 NOTE — Patient Instructions (Addendum)
A few things to remember from today's visit:   Type 2 diabetes mellitus with stage 3b chronic kidney disease, without long-term current use of insulin (HCC)  Weight loss, non-intentional  Hematospermia  Urinary frequency  If you need refills please call your pharmacy. Do not use My Chart to request refills or for acute issues that need immediate attention.   Blood pressure is very high, so I am asking your wife to drive you to the ER. I am also concerned about your abdominal pain, could be pancreas or vascular.   Please be sure medication list is accurate. If a new problem present, please set up appointment sooner than planned today.

## 2021-09-04 NOTE — ED Notes (Signed)
Got patient on the monitor patient is resting with call bell in reach and family at bedside °

## 2021-09-04 NOTE — Progress Notes (Signed)
ACUTE VISIT Chief Complaint  Patient presents with   stomach issues    Acid build up x a month or longer.    Weight Loss    Has lost about 20 pounds.   blood in sperm    Only during intercourse, not the first time it has happened.   HPI: Paul Rivers is a 41 y.o. male, who is here today with his wife complaining of intermittent upper abdominal pain. Epigastric abdominal pain for the past month, no radiated,"uncomfortable, his wife states that pain "put him on his knees."  Exacerbated by eating any type of food, he has milder pain when he does not eat. Yesterday it happened  5-10 min after starting eating dinner, chicken soup. He has not identified alleviating factors, usually gets in bed in fetal position and falls asleep, so not sure how long pain last.  He has had associated nausea couple times, no vomiting. He has not noted changes in bowel habits, melena, blood in the stool.  His wife is also concerned about weight loss, he is trying not to eat to avoid pain. Negative for fever, chills, night sweats, urinary symptoms, or skin rash.  Blood in sperm, problem has been going on intermittently for years.  It has happened during last 2 sex intercourse. No hx of trauma. He has not identified exacerbating factors. His wife saw a "blood clot, meaty texture, red."  Urine frequency , his wife states that her drinks "a lot of fluids. Negative for testicular pain, genital lesions, dysuria, or vaginal discharge.  Hypertension: He has not taken his antihypertensive medications for the past week. He is not checking BP at home. He was getting "bad headaches" around the same time he started with abdominal pain, attributed to antihypertensive medications, so he discontinued medication.  Headache resolved. He was on hydralazine 50 mg 3 times daily, carvedilol 25 mg twice daily, and amlodipine 10 mg daily. He has been on medication for several months, no side effects reported in the  past. Negative for CP, palpitations, dyspnea, orthopnea, PND, edema, or focal neurologic deficit. Echo in 05/2019 showed LVEF 55 to 60% and severely increased left ventricular hypertrophy.  CKD III: Negative for gross hematuria or foam in urine.  Component     Latest Ref Rng & Units 10/04/2020          Sodium     135 - 145 mmol/L 138  Potassium     3.5 - 5.1 mmol/L 4.0  Chloride     98 - 111 mmol/L 104  CO2     22 - 32 mmol/L 26  Glucose     70 - 99 mg/dL 133 (H)  BUN     6 - 20 mg/dL 19  Creatinine     0.61 - 1.24 mg/dL 1.90 (H)  Calcium     8.9 - 10.3 mg/dL 8.8  GFR     >60.00 mL/min 44.23 (L)   DM II: Dx'ed in 09/2016. Last HgA1C in 09/2020 was 6.6. He is not on pharmacologic treatment. He is not checking BS. Last eye exam over a year ago.  Review of Systems  Constitutional:  Positive for appetite change and fatigue. Negative for activity change and fever.  HENT:  Negative for mouth sores, nosebleeds, sore throat and trouble swallowing.   Eyes:  Negative for redness and visual disturbance.  Respiratory:  Negative for cough and wheezing.   Endocrine: Negative for cold intolerance and heat intolerance.  Musculoskeletal:  Negative for  gait problem and myalgias.  Neurological:  Negative for dizziness, syncope, facial asymmetry and weakness.  Rest see pertinent positives and negatives per HPI.  Current Outpatient Medications on File Prior to Visit  Medication Sig Dispense Refill   amLODipine (NORVASC) 10 MG tablet Take 1 tablet (10 mg total) by mouth daily. 90 tablet 3   Blood Glucose Monitoring Suppl (ACCU-CHEK AVIVA CONNECT) w/Device KIT 1 Device by Does not apply route daily. 1 kit 0   carvedilol (COREG) 25 MG tablet Take 1 tablet by mouth twice daily 180 tablet 0   hydrALAZINE (APRESOLINE) 50 MG tablet TAKE 1 TABLET BY MOUTH THREE TIMES DAILY 90 tablet 0   ondansetron (ZOFRAN) 4 MG tablet Take 1 tablet (4 mg total) by mouth every 6 (six) hours as needed for nausea.  20 tablet 0   pantoprazole (PROTONIX) 40 MG tablet Take 1 tablet (40 mg total) by mouth daily. 30 tablet 3   rosuvastatin (CRESTOR) 10 MG tablet Take 1 tablet (10 mg total) by mouth daily. 90 tablet 2   No current facility-administered medications on file prior to visit.     Past Medical History:  Diagnosis Date   Diabetes mellitus without complication ([place])    Hyperlipidemia    Hypertension    No Known Allergies  Social History   Socioeconomic History   Marital status: Married    Spouse name: Not on file   Number of children: 3   Years of education: 12   Highest education level: Not on file  Occupational History   Occupation: Security  Tobacco Use   Smoking status: Never   Smokeless tobacco: Never  Vaping Use   Vaping Use: Never used  Substance and Sexual Activity   Alcohol use: Yes    Comment: Rarely   Drug use: No   Sexual activity: Not on file  Other Topics Concern   Not on file  Social History Narrative   Fun/hobby: Keeping up with children (6, 4, 14)   Social Determinants of Health   Financial Resource Strain: Not on file  Food Insecurity: Not on file  Transportation Needs: Not on file  Physical Activity: Not on file  Stress: Not on file  Social Connections: Not on file   Vitals:   09/04/21 0830 09/04/21 0917  BP: (!) 280/160 (!) 290/165  Pulse: (!) 104   Resp: 16   SpO2: 98%    Wt Readings from Last 3 Encounters:  09/04/21 203 lb 4 oz (92.2 kg)  12/07/19 227 lb 8 oz (103.2 kg)  09/02/19 224 lb (101.6 kg)   Body mass index is 36 kg/m.  Physical Exam Vitals and nursing note reviewed.  Constitutional:      General: He is not in acute distress.    Appearance: He is well-developed.  HENT:     Head: Normocephalic and atraumatic.     Mouth/Throat:     Mouth: Mucous membranes are moist.     Pharynx: Oropharynx is clear.  Eyes:     Conjunctiva/sclera: Conjunctivae normal.  Cardiovascular:     Rate and Rhythm: Normal rate and regular rhythm.      Heart sounds: No murmur heard.    Comments: DP pulses present. Pulmonary:     Effort: Pulmonary effort is normal. No respiratory distress.     Breath sounds: Normal breath sounds.  Abdominal:     General: Bowel sounds are normal.     Palpations: Abdomen is soft. There is no hepatomegaly, mass or pulsatile mass.  Tenderness: There is abdominal tenderness in the epigastric area and periumbilical area. There is guarding.  Lymphadenopathy:     Cervical: No cervical adenopathy.  Skin:    General: Skin is warm.     Findings: No erythema or rash.  Neurological:     Mental Status: He is alert and oriented to person, place, and time.     Cranial Nerves: No cranial nerve deficit.     Gait: Gait normal.  Psychiatric:     Comments: Well groomed, good eye contact.   ASSESSMENT AND PLAN:  Mr. Malcome was seen today for stomach issues, weight loss and blood in sperm.  Diagnoses and all orders for this visit:  Abdominal pain, epigastric We discussed possible etiologies, pain is severe and abdomen is guarded on examination today. No bruit appreciated. Given her elevated BP a vascular process needs to be considered. Others in the differential Dx's: Gastritis,pancreatitis,PUD. I think he needs abdominal imaging, which can be in the ER.  Type 2 diabetes mellitus with stage 3b chronic kidney disease, without long-term current use of insulin (HCC) Hemoglobin A1c is at goal. Continue nonpharmacologic treatment. He is overdue for his routine eye exam.  Weight loss, non-intentional Most likely caused by decreased oral intake.  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 We discussed possible etiologies, the likelihood of this being caused by a serious process is low. We can consider urology evaluation.  Urinary frequency We discussed possible etiologies. We decided to hold on further work-up because he is going to the ER. Next visit PSA,UA,and Ucx can be  done.  Hypertensive urgency BP re-checked x 2. He is asymptomatic, sent to the ER. We discussed possible complications of poorly controlled hypertension. He has been on same regimen for months, no side effects.  Hydralazine could have contributed to headache, it has resolved.  Stage 3b chronic kidney disease ([place]) We have not checked renal function sine 09/2020. Renal VAS [place] in 05/2019 did not show renal artery stenosis, hyperechoic renal parenchyma, consistent with medical renal disease, bilateral. Referred to nephrologist in 01/2020 and 09/26/20.  I spent a total of 40 minutes in both face to face and non face to face activities for this visit on the date of this encounter. During this time history was obtained and documented, examination was performed, prior labs/imaging reviewed, and assessment/plan discussed.  Return if symptoms worsen or fail to improve, for Sent to the ER.  Lillianna Sabel G. Martinique, MD  [facility]. [place] office.

## 2021-09-04 NOTE — ED Provider Triage Note (Signed)
Emergency Medicine Provider Triage Evaluation Note  Paul Rivers , a 41 y.o. male  was evaluated in triage.  Pt complains of abdominal pain this been ongoing for the past 3 weeks.  Seen by PCP today and sent to the ED as elevated blood pressure with a systolic in the 499U.  Patient has not taken his blood pressure medication in about 3 weeks he is currently on regimen : Spironolactone 25 mg daily,Hydralazine 50 mg tid,Amlodipine 10 mg daily.  He also reports similar episodes of abdominal pain in the past 4 he had issues.  He is unsure whether he has an ulcer?.  Review of Systems  Positive: Abdominal pain, headache Negative: Nausea, vomiting, fever  Physical Exam  BP (!) 266/182    Pulse 98    Temp 98.7 F (37.1 C) (Oral)    Resp 16    SpO2 99%  Gen:   Awake, no distress   Resp:  Normal effort  MSK:   Moves extremities without difficulty  Other:    Medical Decision Making  Medically screening exam initiated at 10:03 AM.  Appropriate orders placed.  Paul Rivers was informed that the remainder of the evaluation will be completed by another provider, this initial triage assessment does not replace that evaluation, and the importance of remaining in the ED until their evaluation is complete.     Janeece Fitting, PA-C 09/04/21 1006

## 2021-09-04 NOTE — ED Provider Notes (Signed)
[facility] EMERGENCY DEPARTMENT Provider Note   CSN: [id number] Arrival date & time: 09/04/21  0950     History  Chief Complaint  Patient presents with   Hypertension    Paul Rivers is a 41 y.o. male.  Patient with history of uncontrolled high blood pressure, diabetes, normally takes amlodipine, carvedilol and hydralazine however is not taken in the past 2 weeks as he is felt nauseous and intermittent upper abdominal discomfort.  Patient's been admitted before for high blood pressure.  Patient denies any stroke symptoms, no chest pain, no shortness of breath.  Abdominal symptoms intermittent.  Patient denies cocaine use, smoking or illegal drugs.      Home Medications Prior to Admission medications   Medication Sig Start Date End Date Taking? Authorizing Provider  amLODipine (NORVASC) 10 MG tablet Take 1 tablet (10 mg total) by mouth daily. 12/07/19  Yes Martinique, Betty G, MD  carvedilol (COREG) 25 MG tablet Take 1 tablet by mouth twice daily Patient taking differently: Take 25 mg by mouth 2 (two) times daily with a meal. 06/17/21  Yes Martinique, Betty G, MD  hydrALAZINE (APRESOLINE) 50 MG tablet TAKE 1 TABLET BY MOUTH THREE TIMES DAILY Patient taking differently: Take 50 mg by mouth 3 (three) times daily. 07/08/21  Yes Martinique, Betty G, MD  Blood Glucose Monitoring Suppl (ACCU-CHEK AVIVA CONNECT) w/Device KIT 1 Device by Does not apply route daily. 07/04/19   Martinique, Betty G, MD      Allergies    Patient has no known allergies.    Review of Systems   Review of Systems  Constitutional:  Negative for chills and fever.  HENT:  Negative for congestion.   Eyes:  Negative for visual disturbance.  Respiratory:  Negative for shortness of breath.   Cardiovascular:  Negative for chest pain.  Gastrointestinal:  Positive for abdominal pain and nausea. Negative for vomiting.  Genitourinary:  Negative for dysuria and flank pain.  Musculoskeletal:  Negative for back  pain, neck pain and neck stiffness.  Skin:  Negative for rash.  Neurological:  Negative for light-headedness and headaches.   Physical Exam Updated Vital Signs BP (!) 172/108 (BP Location: Left Arm)    Pulse (!) 104    Temp 98.9 F (37.2 C) (Oral)    Resp 16    Ht _0  (1.6 m)    Wt 91.3 kg    SpO2 96%    BMI 35.66 kg/m  Physical Exam Vitals and nursing note reviewed.  Constitutional:      General: He is not in acute distress.    Appearance: He is well-developed.  HENT:     Head: Normocephalic and atraumatic.     Mouth/Throat:     Mouth: Mucous membranes are moist.  Eyes:     General:        Right eye: No discharge.        Left eye: No discharge.     Conjunctiva/sclera: Conjunctivae normal.  Neck:     Trachea: No tracheal deviation.  Cardiovascular:     Rate and Rhythm: Normal rate and regular rhythm.     Pulses: Normal pulses.     Heart sounds: No murmur heard. Pulmonary:     Effort: Pulmonary effort is normal.     Breath sounds: Normal breath sounds.  Abdominal:     General: There is no distension.     Palpations: Abdomen is soft.     Tenderness: There is no abdominal tenderness. There  is no guarding.  Musculoskeletal:        General: No swelling. Normal range of motion.     Cervical back: Normal range of motion and neck supple. No rigidity.  Skin:    General: Skin is warm.     Capillary Refill: Capillary refill takes less than 2 seconds.     Findings: No rash.  Neurological:     General: No focal deficit present.     Mental Status: He is alert.     Cranial Nerves: No cranial nerve deficit.  Psychiatric:        Mood and Affect: Mood normal.    ED Results / Procedures / Treatments   Labs (all labs ordered are listed, but only abnormal results are displayed) Labs Reviewed  CBC WITH DIFFERENTIAL/PLATELET - Abnormal; Notable for the following components:      Result Value   WBC 3.7 (*)    RBC 5.93 (*)    All other components within normal limits   COMPREHENSIVE METABOLIC PANEL - Abnormal; Notable for the following components:   Potassium 2.7 (*)    Chloride 95 (*)    Glucose, Bld 105 (*)    BUN 26 (*)    Creatinine, Ser 3.29 (*)    GFR, Estimated 24 (*)    All other components within normal limits  URINALYSIS, ROUTINE W REFLEX MICROSCOPIC - Abnormal; Notable for the following components:   Hgb urine dipstick SMALL (*)    Protein, ur >300 (*)    All other components within normal limits  BASIC METABOLIC PANEL - Abnormal; Notable for the following components:   Sodium 133 (*)    Potassium 3.1 (*)    Glucose, Bld 158 (*)    BUN 24 (*)    Creatinine, Ser 3.09 (*)    Calcium 8.2 (*)    GFR, Estimated 25 (*)    All other components within normal limits  RESP PANEL BY RT-PCR (FLU A&B, COVID) ARPGX2  LIPASE, BLOOD  URINALYSIS, MICROSCOPIC (REFLEX)  HIV ANTIBODY (ROUTINE TESTING W REFLEX)  CREATININE, URINE, RANDOM  SODIUM, URINE, RANDOM  PROTEIN, URINE, RANDOM  MAGNESIUM  CBC  CBG MONITORING, ED    EKG EKG Interpretation  Date/Time:  Wednesday September 04 2021 10:07:12 EST Ventricular Rate:  90 PR Interval:  168 QRS Duration: 112 QT Interval:  418 QTC Calculation: 511 R Axis:   70 Text Interpretation: Normal sinus rhythm Minimal voltage criteria for LVH, may be normal variant ( Cornell product ) Abnormal QRS-T angle, consider primary T wave abnormality Prolonged QT Abnormal ECG When compared with ECG of 03-Jun-2019 17:29, PREVIOUS ECG IS PRESENT Confirmed by Elnora Morrison [phone]) on 09/04/2021 10:45:50 AM  Radiology CT Renal Stone Study  Result Date: 09/04/2021 CLINICAL DATA:  Generalized abdominal pain. EXAM: CT ABDOMEN AND PELVIS WITHOUT CONTRAST TECHNIQUE: Multidetector CT imaging of the abdomen and pelvis was performed following the standard protocol without IV contrast. RADIATION DOSE REDUCTION: This exam was performed according to the departmental dose-optimization program which includes automated exposure  control, adjustment of the mA and/or kV according to patient size and/or use of iterative reconstruction technique. COMPARISON:  None. FINDINGS: Lower chest: No acute abnormality. Hepatobiliary: Unremarkable noncontrast appearance of the hepatic parenchyma. Gallbladder is unremarkable. No biliary ductal dilation. Pancreas: No pancreatic ductal dilation or evidence of acute inflammation. Spleen: Normal in size without focal abnormality. Adrenals/Urinary Tract: Bilateral adrenal glands are unremarkable. No hydronephrosis. No renal, ureteral or bladder calculi identified. No solid enhancing renal mass. Urinary bladder  is unremarkable for degree of distension. Stomach/Bowel: No enteric contrast was administered. Stomach is mildly distended with ingested material without wall thickening. No pathologic dilation of small or large bowel. The appendix and terminal ileum appear normal. The colon is minimally distended limiting evaluation. No evidence of acute bowel inflammation. Vascular/Lymphatic: No significant vascular findings are present. No pathologically enlarged abdominal or pelvic lymph nodes. Reproductive: Prostate is unremarkable. Other: No abdominal wall hernia or abnormality. No abdominopelvic ascites. Musculoskeletal: Multilevel degenerative changes spine. Mild degenerative changes bilateral hips. No acute osseous abnormality. IMPRESSION: No acute abdominopelvic findings. Specifically, no evidence of obstructive uropathy. Electronically Signed   By: Dahlia Bailiff M.D.   On: 09/04/2021 12:11    Procedures .Critical Care Performed by: Elnora Morrison, MD Authorized by: Elnora Morrison, MD   Critical care provider statement:    Critical care time (minutes):  80   Critical care start time:  09/04/2021 11:00 AM   Critical care end time:  09/04/2021 12:20 PM   Critical care time was exclusive of:  Separately billable procedures and treating other patients and teaching time   Critical care was necessary to  treat or prevent imminent or life-threatening deterioration of the following conditions:  Renal failure   Critical care was time spent personally by me on the following activities:  Development of treatment plan with patient or surrogate, evaluation of patient's response to treatment, examination of patient, ordering and review of laboratory studies, ordering and review of radiographic studies, ordering and performing treatments and interventions, pulse oximetry, re-evaluation of patient's condition and review of old charts    Medications Ordered in ED Medications  amLODipine (NORVASC) tablet 10 mg (10 mg Oral Given 09/04/21 1412)  hydrALAZINE (APRESOLINE) tablet 50 mg (50 mg Oral Given 09/04/21 2255)  heparin injection 5,000 Units (5,000 Units Subcutaneous Given 09/04/21 2255)  sodium chloride flush (NS) 0.9 % injection 3 mL (3 mLs Intravenous Given 09/04/21 2255)  acetaminophen (TYLENOL) tablet 650 mg (has no administration in time range)    Or  acetaminophen (TYLENOL) suppository 650 mg (has no administration in time range)  hydrALAZINE (APRESOLINE) injection 10 mg (10 mg Intravenous Given 09/05/21 0103)  carvedilol (COREG) tablet 25 mg (has no administration in time range)  0.9 %  sodium chloride infusion ( Intravenous New Bag/Given 09/04/21 1929)  sucralfate (CARAFATE) 1 GM/10ML suspension 1 g (1 g Oral Given 09/04/21 2254)  labetalol (NORMODYNE) injection 20 mg (20 mg Intravenous Given 09/04/21 1153)  potassium chloride 10 mEq in 100 mL IVPB (0 mEq Intravenous Stopped 09/04/21 1412)  potassium chloride SA (KLOR-CON M) CR tablet 40 mEq (40 mEq Oral Given 09/04/21 1151)  labetalol (NORMODYNE) injection 20 mg (20 mg Intravenous Given 09/04/21 1243)  potassium chloride SA (KLOR-CON M) CR tablet 20 mEq (20 mEq Oral Given 09/04/21 1925)    ED Course/ Medical Decision Making/ A&P                           Medical Decision Making  Patient presents with significant elevated blood pressure 266/182 and  on reassessment also greater than 983 systolic.  Patient has been noncompliant with his medicines for 2 to 3 weeks likely the cause on top of chronic high blood pressure.  Fortunately patient is overall well-appearing, no signs of stroke or heart attack at this time.  Blood work ordered and reviewed showing acute renal failure creatinine 3.29 previously 1.8, potassium hypokalemia 2.7, magnesium added to labs, mild leukopenia 3.7.  Hemoglobin normal, lipase normal no signs of pancreatitis.  CT scan changed without contrast due to acute renal failure.  IV labetalol ordered 20 mg and discussed and educated in detail for greater than 5 minutes importance of compliance and blood pressure management to prevent heart attack, stroke, death.  Plan for admission the hospital for hypertensive emergency given significant elevated blood pressure and acute renal failure.  IV and oral potassium ordered.  BP improved with IV labetalol times 2 from 270s down to 563O systolic.  Discussed this in detail with patient's wife at the bedside. Hospitalist accepted for further treatment and evaluation.           Final Clinical Impression(s) / ED Diagnoses Final diagnoses:  Hypokalemia  Hypertensive emergency  Acute renal failure, unspecified acute renal failure type [facility])    Rx / DC Orders ED Discharge Orders     None         Elnora Morrison, MD 09/05/21 [phone]

## 2021-09-05 ENCOUNTER — Encounter (HOSPITAL_COMMUNITY): Payer: Self-pay | Admitting: Internal Medicine

## 2021-09-05 ENCOUNTER — Inpatient Hospital Stay (HOSPITAL_COMMUNITY): Payer: Commercial Managed Care - PPO

## 2021-09-05 DIAGNOSIS — N179 Acute kidney failure, unspecified: Secondary | ICD-10-CM | POA: Diagnosis not present

## 2021-09-05 DIAGNOSIS — R9431 Abnormal electrocardiogram [ECG] [EKG]: Secondary | ICD-10-CM

## 2021-09-05 DIAGNOSIS — E876 Hypokalemia: Secondary | ICD-10-CM | POA: Diagnosis not present

## 2021-09-05 DIAGNOSIS — I161 Hypertensive emergency: Secondary | ICD-10-CM | POA: Diagnosis not present

## 2021-09-05 DIAGNOSIS — R1013 Epigastric pain: Secondary | ICD-10-CM | POA: Diagnosis not present

## 2021-09-05 HISTORY — DX: Abnormal electrocardiogram (ECG) (EKG): R94.31

## 2021-09-05 LAB — BASIC METABOLIC PANEL
Anion gap: 10 (ref 5–15)
BUN: 24 mg/dL — ABNORMAL HIGH (ref 6–20)
CO2: 24 mmol/L (ref 22–32)
Calcium: 8.2 mg/dL — ABNORMAL LOW (ref 8.9–10.3)
Chloride: 99 mmol/L (ref 98–111)
Creatinine, Ser: 3.09 mg/dL — ABNORMAL HIGH (ref 0.61–1.24)
GFR, Estimated: 25 mL/min — ABNORMAL LOW (ref >60)
Glucose, Bld: 158 mg/dL — ABNORMAL HIGH (ref 70–99)
Potassium: 3.1 mmol/L — ABNORMAL LOW (ref 3.5–5.1)
Sodium: 133 mmol/L — ABNORMAL LOW (ref 135–145)

## 2021-09-05 LAB — GLUCOSE, CAPILLARY
Glucose-Capillary: 137 mg/dL — ABNORMAL HIGH (ref 70–99)
Glucose-Capillary: 120 mg/dL — ABNORMAL HIGH (ref 70–99)
Glucose-Capillary: 137 mg/dL — ABNORMAL HIGH (ref 70–99)
Glucose-Capillary: 112 mg/dL — ABNORMAL HIGH (ref 70–99)

## 2021-09-05 LAB — HEMOGLOBIN A1C
Hgb A1c MFr Bld: 6.3 % — ABNORMAL HIGH (ref 4.8–5.6)
Mean Plasma Glucose: 134.11 mg/dL

## 2021-09-05 LAB — CBC
HCT: 42.5 % (ref 39.0–52.0)
Hemoglobin: 14.6 g/dL (ref 13.0–17.0)
MCH: 27.9 pg (ref 26.0–34.0)
MCHC: 34.4 g/dL (ref 30.0–36.0)
MCV: 81.1 fL (ref 80.0–100.0)
Platelets: 248 10*3/uL (ref 150–400)
RBC: 5.24 MIL/uL (ref 4.22–5.81)
RDW: 13.1 % (ref 11.5–15.5)
WBC: 4.3 10*3/uL (ref 4.0–10.5)
nRBC: 0 % (ref 0.0–0.2)

## 2021-09-05 LAB — HEPATITIS C ANTIBODY: HCV Ab: NONREACTIVE

## 2021-09-05 LAB — MAGNESIUM: Magnesium: 2.2 mg/dL (ref 1.7–2.4)

## 2021-09-05 LAB — HIV ANTIBODY (ROUTINE TESTING W REFLEX): HIV Screen 4th Generation wRfx: NONREACTIVE

## 2021-09-05 LAB — HEPATITIS B SURFACE ANTIGEN: Hepatitis B Surface Ag: NONREACTIVE

## 2021-09-05 MED ORDER — POTASSIUM CHLORIDE 20 MEQ PO PACK
40.0000 meq | PACK | Freq: Once | ORAL | Status: AC
  Administered 2021-09-05: 40 meq via ORAL
  Filled 2021-09-05: qty 2

## 2021-09-05 MED ORDER — FAMOTIDINE 20 MG PO TABS
20.0000 mg | ORAL_TABLET | Freq: Every day | ORAL | Status: DC
  Administered 2021-09-05 – 2021-09-06 (×2): 20 mg via ORAL
  Filled 2021-09-05 (×2): qty 1

## 2021-09-05 MED ORDER — INSULIN ASPART 100 UNIT/ML IJ SOLN
0.0000 [IU] | Freq: Three times a day (TID) | INTRAMUSCULAR | Status: DC
  Administered 2021-09-05 – 2021-09-06 (×2): 1 [IU] via SUBCUTANEOUS

## 2021-09-05 MED ORDER — SPIRONOLACTONE 25 MG PO TABS
25.0000 mg | ORAL_TABLET | Freq: Every day | ORAL | Status: DC
  Administered 2021-09-05 – 2021-09-07 (×3): 25 mg via ORAL
  Filled 2021-09-05 (×3): qty 1

## 2021-09-05 MED ORDER — POTASSIUM CHLORIDE CRYS ER 20 MEQ PO TBCR
40.0000 meq | EXTENDED_RELEASE_TABLET | Freq: Once | ORAL | Status: AC
  Administered 2021-09-05: 40 meq via ORAL
  Filled 2021-09-05: qty 2

## 2021-09-05 MED ORDER — LABETALOL HCL 5 MG/ML IV SOLN: 10.0000 mg | INTRAVENOUS | Status: DC | PRN

## 2021-09-05 MED ORDER — HYDRALAZINE HCL 50 MG PO TABS
100.0000 mg | ORAL_TABLET | Freq: Three times a day (TID) | ORAL | Status: DC
  Administered 2021-09-05 – 2021-09-07 (×6): 100 mg via ORAL
  Filled 2021-09-05 (×6): qty 2

## 2021-09-05 NOTE — Assessment & Plan Note (Addendum)
Initially there was suspicion that patient had AKI-but it appears that worsening renal function is just progression of his underlying CKD.  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  Renal ultrasound without hydronephrosis, hepatitis B and hepatitis serology was negative.  HIV/ANA were negative.  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  Nephrology will continue to follow closely in the outpatient setting    Lab Results  Component Value Date   CREATININE 3.09 (H) 09/05/2021

## 2021-09-05 NOTE — Assessment & Plan Note (Addendum)
Had severe epigastric pain and weight loss since mid Paul Rivers initially thought that this was likely due to his blood pressure medications and stopped taking his antihypertensives.  Once his blood pressure and renal function stabilized-GI was consulted-he underwent EGD on 1/14 which showed a gastric ulcer.  Recommendations from GI service are to continue PPI on discharge-GI will arrange for further follow-up/work-up to be done in the outpatient setting.  H. pylori/gastric ulcer biopsy results are pending and will need to be followed.  Thankfully after initiation of PPI-abdominal pain has improved-he is able to tolerate oral intake including his antihypertensive medications.

## 2021-09-05 NOTE — Assessment & Plan Note (Signed)
Resolved-if reoccurs-needs outpatient follow-up with hematology for further evaluation.

## 2021-09-05 NOTE — Progress Notes (Signed)
°  Transition of Care [facility]) Screening Note   Patient Details  Name: Paul Rivers Date of Birth: 1980/11/08   Transition of Care [facility]) CM/SW Contact:    Cyndi Bender, RN Phone Number: 09/05/2021, 8:43 AM    Transition of Care Department [facility]) has reviewed patient and no TOC needs have been identified at this time. We will continue to monitor patient advancement through interdisciplinary progression rounds. If new patient transition needs arise, please place a TOC consult.

## 2021-09-05 NOTE — Assessment & Plan Note (Addendum)
Likely due to hypokalemia-resolved after correction of hypokalemia.  Marland Kitchen

## 2021-09-05 NOTE — Assessment & Plan Note (Addendum)
Not on any oral diabetic medication/insulin therapy since 2018 (when he was diagnosed with DM).  Per patient-he has been able to manage his diabetes with diet/lifestyle modification.  Continue to follow closely in the outpatient setting with PCP.  Lab Results  Component Value Date   HGBA1C 6.3 (H) 09/05/2021    Recent Labs    09/05/21 0755  GLUCAP 137*

## 2021-09-05 NOTE — Assessment & Plan Note (Addendum)
Likely due to GI losses/poor oral intake-has been started on Aldactone.  Due to resistant hypertension-nephrology has ordered aldosterone/renin ratio-which is currently pending.

## 2021-09-05 NOTE — Consult Note (Signed)
Nephrology Consult   Requesting provider: Oren Binet Service requesting consult: Hospitalist Reason for consult: HTN, AKI vs CKD   Assessment/Recommendations: Paul Rivers is a/an 41 y.o. male with a past medical history HTN, DM2, obesity who present w/ Epigastric discomfort and HTN  Severe uncontrolled HTN: long standing and likely difficult to control. Severe elevation 2/2 inability to tolerate meds for two weeks. He has had a fairly appropriate reduction in his BP over the past 24 hours. Will continue with slow and steady improvement to hopefully avoid end organ damage -Continue norvasc 10mg  daily, coreg 25mg  BID, hydral 100mg  TID -Start spiro 25mg  daily -F/u renin/aldo given severe HTN and hypokalemia; although unlikely primary hyperaldo given reassuring renin in 2020 -No > than 30% reduction in BP every 24 hours -Follow-up urine drug screen  Tachycardia: some likely reflexive from significant vasodilatory agent use. Not a major concern. Likely will stabilize.  Hypokalemia: Possibly related to poor p.o. intake but no vomiting.  Magnesium normal. Evaluating for hyperaldosteronism as above.  Continue with replacement.  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 Lab data fairly limited as of late. Likely has some small component of AKI but more of this may be CKD.  -HTN mgmt as above -Continue to monitor daily Cr, Dose meds for GFR -Monitor Daily I/Os, Daily weight  -Maintain MAP>65 for optimal renal perfusion.  -Avoid nephrotoxic medications including NSAIDs -Currently no indication for HD  Proteinuria: Can be seen in the setting of severe hypertension.  Will follow-up quantification but likely will improve with hypertension control  Weight Loss/Epigastric abd pain: mgmt and w/u per primary  DM2: recent a1c 6.3. Now managed with lifestyle changes. mgmt per primary   Recommendations conveyed to primary service.     [facility] 09/05/2021 3:33 PM   _____________________________________________________________________________________ CC: Hypertension  History of Present Illness: Paul Rivers is a/an 41 y.o. male with a past medical history of HTN, HLD, DM2, obesity who presents with hypertension and epigastric discomfort.  The patient presented to the emergency department yesterday at the recommendation of his PCP.  He saw his primary care provider yesterday because of epigastric abdominal pain that had been going on for at least a month.  He has lost about 20 to 30 pounds due to the symptoms.  He will get epigastric discomfort as well as nausea associated with food intake.  No significant emesis or bloody stools.  Because of his symptoms he has had difficulty taking his blood pressure medications and was not taking any for the past 2 weeks prior to admission.  He has a longstanding history of hypertension and states that it has been hard to control. Strong family history of HTN. Specifically denies CP, sob, f/c, cough, hematuria, decreased uop, nsaid use. No visual changes or headaches.  In the emergency department his creatinine was noted to be 3.3.  Last known creatinine before that was 2 in February 2022.  In the emergency department initial blood pressure was 290/165.  Urinalysis was positive for proteinuria.  Renal CT was without significant concern.  The patient was admitted for further management.  Notably the patient had hypokalemia on admission as well as back in 2020.  It is also notable that the patient had a renal artery PVL in 2020 as well as a renin that was not suppressed at ~10.  The patient's medications were started back.and overall his blood pressure has significantly improved to  around 270-623 systolic.  Medications:  Current Facility-Administered Medications  Medication Dose Route Frequency Provider Last Rate Last Admin   acetaminophen (TYLENOL)  tablet 650 mg  650 mg Oral Q6H PRN Fuller Plan A, MD       Or   acetaminophen (TYLENOL) suppository 650 mg  650 mg Rectal Q6H PRN Tamala Julian, Rondell A, MD       amLODipine (NORVASC) tablet 10 mg  10 mg Oral Daily Tamala Julian, Rondell A, MD   10 mg at 09/05/21 0849   carvedilol (COREG) tablet 25 mg  25 mg Oral BID WC Smith, Rondell A, MD   25 mg at 09/05/21 0849   famotidine (PEPCID) tablet 20 mg  20 mg Oral Daily Jonetta Osgood, MD   20 mg at 09/05/21 1355   heparin injection 5,000 Units  5,000 Units Subcutaneous Q8H Smith, Rondell A, MD   5,000 Units at 09/05/21 1359   hydrALAZINE (APRESOLINE) injection 10 mg  10 mg Intravenous Q4H PRN Fuller Plan A, MD   10 mg at 09/05/21 0516   hydrALAZINE (APRESOLINE) tablet 100 mg  100 mg Oral TID Jonetta Osgood, MD       insulin aspart (novoLOG) injection 0-9 Units  0-9 Units Subcutaneous TID WC Ghimire, Henreitta Leber, MD       labetalol (NORMODYNE) injection 10 mg  10 mg Intravenous Q4H PRN Ghimire, Henreitta Leber, MD       sodium chloride flush (NS) 0.9 % injection 3 mL  3 mL Intravenous Q12H Smith, Rondell A, MD   3 mL at 09/05/21 7628   spironolactone (ALDACTONE) tablet 25 mg  25 mg Oral Daily Reesa Chew, MD   25 mg at 09/05/21 1358   sucralfate (CARAFATE) 1 GM/10ML suspension 1 g  1 g Oral TID WC & HS Tamala Julian, Rondell A, MD   1 g at 09/05/21 1355     ALLERGIES Patient has no known allergies.  MEDICAL HISTORY Past Medical History:  Diagnosis Date   Diabetes mellitus without complication ([place])    Hyperlipidemia    Hypertension    Prolonged QT interval 09/05/2021     SOCIAL HISTORY Social History   Socioeconomic History   Marital status: Married    Spouse name: Not on file   Number of children: 3   Years of education: 12   Highest education level: Not on file  Occupational History   Occupation: Security  Tobacco Use   Smoking status: Never   Smokeless tobacco: Never  Vaping Use   Vaping Use: Never used  Substance and Sexual Activity    Alcohol use: Yes    Comment: Rarely   Drug use: No   Sexual activity: Not on file  Other Topics Concern   Not on file  Social History Narrative   Fun/hobby: Keeping up with children (6, 4, 14)   Social Determinants of Health   Financial Resource Strain: Not on file  Food Insecurity: Not on file  Transportation Needs: Not on file  Physical Activity: Not on file  Stress: Not on file  Social Connections: Not on file  Intimate Partner Violence: Not on file     FAMILY HISTORY Family History  Problem Relation Age of Onset   Diabetes Mother    Hypertension Mother    Diabetes Father    Hypertension Father    Hypertension Maternal Grandmother       Review of Systems: 12 systems reviewed Otherwise as per HPI, all other systems reviewed and negative  Physical Exam: Vitals:   09/05/21 0800 09/05/21 1200  BP: (!) 188/130 (!) 191/123  Pulse: (!) 107 (!) 109  Resp: 16 20  Temp: 98.8 F (37.1 C) 99 F (37.2 C)  SpO2: 95%    No intake/output data recorded.  Intake/Output Summary (Last 24 hours) at 09/05/2021 1533 Last data filed at 09/04/2021 2139 Gross per 24 hour  Intake 480 ml  Output --  Net 480 ml   General: well-appearing, no acute distress HEENT: anicteric sclera, oropharynx clear without lesions CV: regular rate, normal rhythm, no murmurs Lungs: clear to auscultation bilaterally, normal work of breathing Abd: soft, non-tender, non-distended Skin: no visible lesions or rashes Psych: alert, engaged, appropriate mood and affect Musculoskeletal: no obvious deformities Neuro: normal speech, no gross focal deficits   Test Results Reviewed Lab Results  Component Value Date   NA 133 (L) 09/05/2021   K 3.1 (L) 09/05/2021   CL 99 09/05/2021   CO2 24 09/05/2021   BUN 24 (H) 09/05/2021   CREATININE 3.09 (H) 09/05/2021   GFR 44.23 (L) 10/04/2020   CALCIUM 8.2 (L) 09/05/2021   ALBUMIN 4.1 09/04/2021     I have reviewed all relevant outside healthcare records  related to the patient's current hospitalization

## 2021-09-05 NOTE — Assessment & Plan Note (Addendum)
Due to noncompliance in the setting of abdominal pain-he also has whitecoat hypertension and resistant hypertension.  Blood pressure stabilized-after initiation of Coreg/hydralazine/amlodipine and addition of Aldactone.  He is now able to tolerate all of his antihypertensive medications as his abdominal pain has improved-CT below regarding abdominal pain.  Please note-patient had a work-up in 2020 for resistant causes-work-up was negative.  Aldosterone/renin panel was ordered by nephrology-and is pending-please ensure that this gets followed up.

## 2021-09-05 NOTE — TOC Initial Note (Signed)
Transition of Care [facility]) - Initial/Assessment Note    Patient Details  Name: Paul Rivers MRN: [id number] Date of Birth: 1980-12-24  Transition of Care [facility]) CM/SW Contact:    Cyndi Bender, RN Phone Number: 09/05/2021, 3:28 PM  Clinical Narrative:                 Spoke to patient and wife about transition needs. Patient states he stopped his HTN medications because he felt nauseous and he wanted to see if his HTN medications were the reason. He understands the importance of taking his medications at this time.  TOC will continue to follow   Expected Discharge Plan: Home/Self Care Barriers to Discharge: Continued Medical Work up   Patient Goals and CMS Choice Patient states their goals for this hospitalization and ongoing recovery are:: get better      Expected Discharge Plan and Services Expected Discharge Plan: Home/Self Care                                              Prior Living Arrangements/Services   Lives with:: Spouse, Adult Children Patient language and need for interpreter reviewed:: Yes Do you feel safe going back to the place where you live?: Yes      Need for Family Participation in Patient Care: Yes (Comment) Care giver support system in place?: Yes (comment)   Criminal Activity/Legal Involvement Pertinent to Current Situation/Hospitalization: No - Comment as needed  Activities of Daily Living      Permission Sought/Granted                  Emotional Assessment Appearance:: Appears stated age Attitude/Demeanor/Rapport: Engaged Affect (typically observed): Accepting Orientation: : Oriented to Self, Oriented to Place, Oriented to  Time, Oriented to Situation Alcohol / Substance Use: Not Applicable Psych Involvement: No (comment)  Admission diagnosis:  Hypokalemia [E87.6] Hypertensive emergency [I16.1] Acute renal failure, unspecified acute renal failure type ([place]) [N17.9] Patient Active Problem List   Diagnosis Date  Noted   Prolonged QT interval 09/05/2021   Epigastric pain 09/04/2021   Weight loss 09/04/2021   Leukopenia 09/04/2021   Acute kidney injury superimposed on chronic kidney disease ([place]) 09/04/2021   Vitamin D deficiency, unspecified 12/07/2019   Elevated troponin 06/05/2019   Hypokalemia 06/04/2019   CKD (chronic kidney disease), stage III ([place]) 06/04/2019   Type II diabetes mellitus with renal manifestations ([place]) 06/04/2019   Hypertensive emergency    Hypertensive urgency 06/03/2019   Hyperlipidemia associated with type 2 diabetes mellitus ([place]) 02/22/2019   Resistant hypertension 10/22/2016   Obesity (BMI 30-39.9) 10/22/2016   Type 2 diabetes mellitus with other specified complication ([place]) 35/46/5681   PCP:  [place], Betty G, MD Pharmacy:  No Pharmacies Listed    Social Determinants of Health (SDOH) Interventions    Readmission Risk Interventions No flowsheet data found.

## 2021-09-05 NOTE — Progress Notes (Addendum)
PROGRESS NOTE        PATIENT DETAILS Name: Paul Rivers Age: 41 y.o. Sex: male Date of Birth: [birth year] Admit Date: 09/04/2021 Admitting Physician Norval Morton, MD NGE:[id number], Malka So, MD  Brief Narrative: Patient is a 41 y.o. male with history of HTN, HLD, DM-2, obesity who presented to the ED with epigastric pain/decreased appetite/weight loss for the past several weeks-he stopped taking his antihypertensive medications due to epigastric pain-he was subsequently found to have hypertensive emergency, and AKI.  He was subsequently admitted to the hospitalist service.    Subjective: Lying comfortably in bed-denies any chest pain or shortness of breath.  Vomited earlier this morning after potassium pill got stuck in his throat.  Objective: Vitals: Blood pressure (!) 188/130, pulse (!) 107, temperature 98.8 F (37.1 C), resp. rate 16, height 5\' 3"  (1.6 m), weight 91.3 kg, SpO2 95 %.   Exam: Gen Exam:Alert awake-not in any distress HEENT:atraumatic, normocephalic Chest: B/L clear to auscultation anteriorly CVS:S1S2 regular Abdomen:soft non tender, non distended Extremities:no edema Neurology: Non focal Skin: no rash  Pertinent Labs/Radiology: Recent Labs  Lab 09/04/21 1009 09/05/21 0144  WBC 3.7* 4.3  HGB 16.3 14.6  PLT 257 248  NA 136 133*  K 2.7* 3.1*  CREATININE 3.29* 3.09*  AST 28  --   ALT 22  --   ALKPHOS 67  --   BILITOT 0.8  --     Assessment/Plan: * Hypertensive emergency- (present on admission) Due to noncompliance in the setting of abdominal pain-BP slowly improving-but still significantly elevated.  Spouse at bedside claims he has whitecoat hypertension as well.  Reviewed prior outpatient cardiology notes-appears to have resistant hypertension-and had a negative work-up for secondary causes in 2020.  Increase hydralazine to 100 mg 3 times daily, continue amlodipine and Coreg.  Stop IV fluids to see if this helps with better BP  control.   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  No hydronephrosis seen on CT abdomen.  UA positive for proteinuria but has history of diabetes that was diagnosed in 2018 (not on any medications for the past several years-controlled with diet/exercise).  We will check urine protein creatinine ratio-hepatitis B& C serology, ANA and a renal ultrasound.  Epigastric pain- (present on admission) Likely peptic ulcer disease/duodenal ulcer given that epigastric pain worsens with food.  No history of NSAID use/EtOH use/smoking.  Not started on PPI due to prolonged QTC- will discuss with pharmacy and start H2 blocker.  Remains on Carafate.  Suspect will need endoscopic evaluation (persistent pain since mid December/with weight loss) once BP is better controlled/AKI improves further.   Type 2 diabetes mellitus with other specified complication ([place])- (present on admission) A1c 6.3 on 09/05/2021.  Not on any oral diabetic medication/insulin therapy since 2018 (when he was diagnosed with DM).  Per patient-he has been able to manage his diabetes with diet/lifestyle modification.  Unclear whether his underlying CKD is from DM at this point.   Recent Labs    09/05/21 0755  GLUCAP 137*    Leukopenia- (present on admission) Resolved-if reoccurs-needs outpatient follow-up with hematology  for further evaluation.  Hypokalemia- (present on admission) Continue to replete and recheck.  Likely due to vomiting/poor oral intake.  His prior work-up for primary hyperaldosteronism was negative.  Prolonged QT interval- (present on admission) Likely due to hypokalemia-continue replete  potassium levels-magnesium level stable.  Repeat twelve-lead EKG tomorrow morning.  Obesity Estimated body mass index is 35.66 kg/m as calculated from the following:   Height as of this encounter: 5\' 3"  (1.6 m).   Weight as of this encounter: 91.3 kg.   Procedures: None Consults: None DVT Prophylaxis: SQ Heparin Code Status:Full code Family Communication: Spouse at bedside   Disposition Plan: Status is: Inpatient  Remains inpatient appropriate because: Uncontrolled hypertension slowly improving-AKI improving but not yet at baseline.  Needs another 1-2 days of continued inpatient monitoring before consideration of discharge.   Diet: Diet Order             Diet Carb Modified Fluid consistency: Thin; Room service appropriate? Yes  Diet effective now                     Antimicrobial agents: Anti-infectives (From admission, onward)    None        MEDICATIONS: Scheduled Meds:  amLODipine  10 mg Oral Daily   carvedilol  25 mg Oral BID WC   famotidine  20 mg Oral Daily   heparin  5,000 Units Subcutaneous Q8H   hydrALAZINE  100 mg Oral TID   insulin aspart  0-9 Units Subcutaneous TID WC   potassium chloride  40 mEq Oral Once   sodium chloride flush  3 mL Intravenous Q12H   sucralfate  1 g Oral TID WC & HS   Continuous Infusions:   PRN Meds:.acetaminophen **OR** acetaminophen, hydrALAZINE   I have personally reviewed following labs and imaging studies  LABORATORY DATA: CBC: Recent Labs  Lab 09/04/21 1009 09/05/21 0144  WBC 3.7* 4.3  NEUTROABS 2.2  --   HGB 16.3 14.6  HCT 48.8 42.5  MCV 82.3 81.1  PLT 257 973    Basic Metabolic Panel: Recent Labs  Lab 09/04/21 1009 09/05/21 0144  NA 136 133*  K 2.7* 3.1*  CL 95* 99  CO2 30 24  GLUCOSE 105* 158*  BUN 26* 24*  CREATININE 3.29* 3.09*  CALCIUM 9.1 8.2*  MG 2.3 2.2    GFR: Estimated Creatinine Clearance: 32.1 mL/min (A) (by C-G formula based on SCr of 3.09 mg/dL (H)).  Liver Function  Tests: Recent Labs  Lab 09/04/21 1009  AST 28  ALT 22  ALKPHOS 67  BILITOT 0.8  PROT 7.7  ALBUMIN 4.1   Recent Labs  Lab 09/04/21 1009  LIPASE 36   No results for input(s): AMMONIA in the last 168 hours.  Coagulation Profile: No results for input(s): INR, PROTIME in the last 168 hours.  Cardiac Enzymes: No results for input(s): CKTOTAL, CKMB, CKMBINDEX, TROPONINI in the last 168 hours.  BNP (last 3 results) No results for input(s): PROBNP in the last 8760 hours.  Lipid Profile: No results for input(s): CHOL, HDL, LDLCALC, TRIG, CHOLHDL, LDLDIRECT in the last 72 hours.  Thyroid Function Tests: No results for input(s): TSH, T4TOTAL, FREET4, T3FREE, THYROIDAB in the last 72 hours.  Anemia Panel: No results for input(s): VITAMINB12, FOLATE, FERRITIN, TIBC, IRON, RETICCTPCT in the last 72 hours.  Urine analysis:    Component Value Date/Time   COLORURINE YELLOW 09/04/2021 1110   APPEARANCEUR CLEAR 09/04/2021 1110   LABSPEC 1.020 09/04/2021 1110  PHURINE 7.0 09/04/2021 1110   GLUCOSEU NEGATIVE 09/04/2021 1110   HGBUR SMALL (A) 09/04/2021 1110   BILIRUBINUR NEGATIVE 09/04/2021 1110   KETONESUR NEGATIVE 09/04/2021 1110   PROTEINUR >300 (A) 09/04/2021 1110   NITRITE NEGATIVE 09/04/2021 1110   LEUKOCYTESUR NEGATIVE 09/04/2021 1110    Sepsis Labs: Lactic Acid, Venous No results found for: LATICACIDVEN  MICROBIOLOGY: Recent Results (from the past 240 hour(s))  Resp Panel by RT-PCR (Flu A&B, Covid) Nasopharyngeal Swab     Status: None   Collection Time: 09/04/21  2:15 PM   Specimen: Nasopharyngeal Swab; Nasopharyngeal(NP) swabs in vial transport medium  Result Value Ref Range Status   SARS Coronavirus 2 by RT PCR NEGATIVE NEGATIVE Final    Comment: (NOTE) SARS-CoV-2 target nucleic acids are NOT DETECTED.  The SARS-CoV-2 RNA is generally detectable in upper respiratory specimens during the acute phase of infection. The lowest concentration of SARS-CoV-2 viral  copies this assay can detect is 138 copies/mL. A negative result does not preclude SARS-Cov-2 infection and should not be used as the sole basis for treatment or other patient management decisions. A negative result may occur with  improper specimen collection/handling, submission of specimen other than nasopharyngeal swab, presence of viral mutation(s) within the areas targeted by this assay, and inadequate number of viral copies(<138 copies/mL). A negative result must be combined with clinical observations, patient history, and epidemiological information. The expected result is Negative.  Fact Sheet for Patients:  EntrepreneurPulse.com.au  Fact Sheet for Healthcare Providers:  IncredibleEmployment.be  This test is no t yet approved or cleared by the [place] FDA and  has been authorized for detection and/or diagnosis of SARS-CoV-2 by FDA under an Emergency Use Authorization (EUA). This EUA will remain  in effect (meaning this test can be used) for the duration of the COVID-19 declaration under Section 564(b)(1) of the Act, 21 U.S.C.section 360bbb-3(b)(1), unless the authorization is terminated  or revoked sooner.       Influenza A by PCR NEGATIVE NEGATIVE Final   Influenza B by PCR NEGATIVE NEGATIVE Final    Comment: (NOTE) The Xpert Xpress SARS-CoV-2/FLU/RSV plus assay is intended as an aid in the diagnosis of influenza from Nasopharyngeal swab specimens and should not be used as a sole basis for treatment. Nasal washings and aspirates are unacceptable for Xpert Xpress SARS-CoV-2/FLU/RSV testing.  Fact Sheet for Patients: EntrepreneurPulse.com.au  Fact Sheet for Healthcare Providers: IncredibleEmployment.be  This test is not yet approved or cleared by the [place] FDA and has been authorized for detection and/or diagnosis of SARS-CoV-2 by FDA under an Emergency Use Authorization (EUA). This  EUA will remain in effect (meaning this test can be used) for the duration of the COVID-19 declaration under Section 564(b)(1) of the Act, 21 U.S.C. section 360bbb-3(b)(1), unless the authorization is terminated or revoked.  Performed at [facility], [place] [address] [place], [place] [address]     RADIOLOGY STUDIES/RESULTS: CT Renal Stone Study  Result Date: 09/04/2021 CLINICAL DATA:  Generalized abdominal pain. EXAM: CT ABDOMEN AND PELVIS WITHOUT CONTRAST TECHNIQUE: Multidetector CT imaging of the abdomen and pelvis was performed following the standard protocol without IV contrast. RADIATION DOSE REDUCTION: This exam was performed according to the departmental dose-optimization program which includes automated exposure control, adjustment of the mA and/or kV according to patient size and/or use of iterative reconstruction technique. COMPARISON:  None. FINDINGS: Lower chest: No acute abnormality. Hepatobiliary: Unremarkable noncontrast appearance of the hepatic parenchyma. Gallbladder is unremarkable. No biliary ductal dilation. Pancreas:  No pancreatic ductal dilation or evidence of acute inflammation. Spleen: Normal in size without focal abnormality. Adrenals/Urinary Tract: Bilateral adrenal glands are unremarkable. No hydronephrosis. No renal, ureteral or bladder calculi identified. No solid enhancing renal mass. Urinary bladder is unremarkable for degree of distension. Stomach/Bowel: No enteric contrast was administered. Stomach is mildly distended with ingested material without wall thickening. No pathologic dilation of small or large bowel. The appendix and terminal ileum appear normal. The colon is minimally distended limiting evaluation. No evidence of acute bowel inflammation. Vascular/Lymphatic: No significant vascular findings are present. No pathologically enlarged abdominal or pelvic lymph nodes. Reproductive: Prostate is unremarkable. Other: No abdominal wall hernia or abnormality.  No abdominopelvic ascites. Musculoskeletal: Multilevel degenerative changes spine. Mild degenerative changes bilateral hips. No acute osseous abnormality. IMPRESSION: No acute abdominopelvic findings. Specifically, no evidence of obstructive uropathy. Electronically Signed   By: Dahlia Bailiff M.D.   On: 09/04/2021 12:11     LOS: 1 day   Oren Binet, MD  [facility]    To contact the attending provider between 7A-7P or the covering provider during after hours 7P-7A, please log into the web site [email or web address] and access using universal [place] password for that web site. If you do not have the password, please call the hospital operator.  09/05/2021, 11:09 AM

## 2021-09-06 DIAGNOSIS — I161 Hypertensive emergency: Secondary | ICD-10-CM | POA: Diagnosis not present

## 2021-09-06 DIAGNOSIS — N179 Acute kidney failure, unspecified: Secondary | ICD-10-CM | POA: Diagnosis not present

## 2021-09-06 DIAGNOSIS — R1013 Epigastric pain: Secondary | ICD-10-CM | POA: Diagnosis not present

## 2021-09-06 DIAGNOSIS — E876 Hypokalemia: Secondary | ICD-10-CM | POA: Diagnosis not present

## 2021-09-06 LAB — RAPID URINE DRUG SCREEN, HOSP PERFORMED
Amphetamines: NOT DETECTED
Barbiturates: NOT DETECTED
Benzodiazepines: NOT DETECTED
Cocaine: NOT DETECTED
Opiates: NOT DETECTED
Tetrahydrocannabinol: NOT DETECTED

## 2021-09-06 LAB — GLUCOSE, CAPILLARY
Glucose-Capillary: 99 mg/dL (ref 70–99)
Glucose-Capillary: 104 mg/dL — ABNORMAL HIGH (ref 70–99)
Glucose-Capillary: 122 mg/dL — ABNORMAL HIGH (ref 70–99)
Glucose-Capillary: 140 mg/dL — ABNORMAL HIGH (ref 70–99)

## 2021-09-06 LAB — BASIC METABOLIC PANEL
Anion gap: 11 (ref 5–15)
BUN: 26 mg/dL — ABNORMAL HIGH (ref 6–20)
CO2: 25 mmol/L (ref 22–32)
Calcium: 8.6 mg/dL — ABNORMAL LOW (ref 8.9–10.3)
Chloride: 100 mmol/L (ref 98–111)
Creatinine, Ser: 3.36 mg/dL — ABNORMAL HIGH (ref 0.61–1.24)
GFR, Estimated: 23 mL/min — ABNORMAL LOW (ref >60)
Glucose, Bld: 96 mg/dL (ref 70–99)
Potassium: 3.1 mmol/L — ABNORMAL LOW (ref 3.5–5.1)
Sodium: 136 mmol/L (ref 135–145)

## 2021-09-06 LAB — PROTEIN / CREATININE RATIO, URINE
Creatinine, Urine: 316.6 mg/dL
Protein Creatinine Ratio: 1.24 mg/mg{Cre} — ABNORMAL HIGH (ref 0.00–0.15)
Total Protein, Urine: 393 mg/dL

## 2021-09-06 LAB — MAGNESIUM: Magnesium: 2.1 mg/dL (ref 1.7–2.4)

## 2021-09-06 LAB — ANA W/REFLEX IF POSITIVE: Anti Nuclear Antibody (ANA): NEGATIVE

## 2021-09-06 MED ORDER — POTASSIUM CHLORIDE 20 MEQ PO PACK
40.0000 meq | PACK | ORAL | Status: AC
  Administered 2021-09-06 (×2): 40 meq via ORAL
  Filled 2021-09-06 (×2): qty 2

## 2021-09-06 MED ORDER — PANTOPRAZOLE SODIUM 40 MG PO TBEC
40.0000 mg | DELAYED_RELEASE_TABLET | Freq: Every day | ORAL | Status: DC
  Administered 2021-09-06: 40 mg via ORAL
  Filled 2021-09-06: qty 1

## 2021-09-06 NOTE — Consult Note (Signed)
° °Referring Provider: [facility] °PCP: Jordan, Betty G, MD  Gastroenterologist: Unassigned °Reason for consultation:      Abdominal pain and weight loss           ° ° °ASSESSMENT / PLAN  ° °#  40 yo male admitted with epigastric pain /unintentional weight loss since Christmas. No associated N/V. No-contrast CTAP  09/04/21  negative for acute abnormalities . Etiology of pain unclear but some degree of musculoskeletal pain suspected based on his exam.   °--Recommend EGD. The risks and benefits of EGD with possible biopsies were discussed with the patient who agrees to proceed.  °--Unable to proceed with procedure today as he had solids for breakfast. Additionally his BP is still elevated. Discussed with TRH, patient has resistant BP and currently at baseline. Will see was Anesthesia says when they evaluate him in am.  °--Can eat today, NPO after MN °--I held sq heparin after tonight's dose in anticipation of am EGD ° °# AKI on CKD 3b °--Stopping Carafate for now, given AKI / CKD. Can decide after EGD if it is needed ° °# DM, A1c 6.3  ° °# Hypertensive urgency.  ° °# Hematospermia, chronic intermittent ° °# Additional medical history listed below.  ° °HISTORY OF PRESENT ILLNESS  ° °                                                                                                                      °Chief Complaint: upper abdominal pain weight loss ° °Paul Rivers is a 40 y.o. male with a past medical history significant for HTN, CKD, DM2, obesity. See PMH for any additional medical problems.  ° ° °ED course:  °Patient presented to ED 09/04/21 evaluation of intermittent upper abdominal pain worse with eating and also hypertensive ( at office).  Had seen PCP who sent him to ED ° °Potassium 2.7, BUN 26, creatinine 3.29 , WBC 3.7 CBC otherwise normal but MCV low normal at 81.A1c, 6.3 ° °Renal US suggests medical renal disease.  ° °[place] says he started having intermittent upper abdominal discomfort around  Christmas It is a dull ache, non-radiating. It is exacerbated by eating but also occurs at random times. Discomfort not related to movement.  No associated nausea / vomiting. He doesn't take NSAIDS. No black stools or blood in stools. He denies bowel changes. He very occasionally has reflux, doesn't take any reflux medications at home.  ° °No [place] of gastric, pancreatic or colon cancers ° °[place] says he is pre-diabetic. He has frequent urination at home. A1c is 6.3.  ° ° °ENDOSCOPIC EVALUATIONS   °home ° ° ° °PREVIOUS IMAGING  °Non-contrast CTAP - no acute findings.  ° ° °Past Medical History:  °Diagnosis Date  ° Diabetes mellitus without complication (HCC)   ° Hyperlipidemia   ° Hypertension   ° Prolonged QT interval 09/05/2021  ° ° °History reviewed. No pertinent surgical history. ° °Prior to Admission medications   °Medication Sig Start Date End   Date Taking? Authorizing Provider  °amLODipine (NORVASC) 10 MG tablet Take 1 tablet (10 mg total) by mouth daily. 12/07/19  Yes Jordan, Betty G, MD  °carvedilol (COREG) 25 MG tablet Take 1 tablet by mouth twice daily °Patient taking differently: Take 25 mg by mouth 2 (two) times daily with a meal. 06/17/21  Yes Jordan, Betty G, MD  °hydrALAZINE (APRESOLINE) 50 MG tablet TAKE 1 TABLET BY MOUTH THREE TIMES DAILY °Patient taking differently: Take 50 mg by mouth 3 (three) times daily. 07/08/21  Yes Jordan, Betty G, MD  °Blood Glucose Monitoring Suppl (ACCU-CHEK AVIVA CONNECT) w/Device KIT 1 Device by Does not apply route daily. 07/04/19   Jordan, Betty G, MD  ° ° °Current Facility-Administered Medications  °Medication Dose Route Frequency Provider Last Rate Last Admin  ° acetaminophen (TYLENOL) tablet 650 mg  650 mg Oral Q6H PRN Smith, Rondell A, MD      ° Or  ° acetaminophen (TYLENOL) suppository 650 mg  650 mg Rectal Q6H PRN Smith, Rondell A, MD      ° amLODipine (NORVASC) tablet 10 mg  10 mg Oral Daily Smith, Rondell A, MD   10 mg at 09/06/21 0819  ° carvedilol (COREG) tablet  25 mg  25 mg Oral BID WC Smith, Rondell A, MD   25 mg at 09/06/21 0820  ° famotidine (PEPCID) tablet 20 mg  20 mg Oral Daily Ghimire, Shanker M, MD   20 mg at 09/06/21 0819  ° heparin injection 5,000 Units  5,000 Units Subcutaneous Q8H Smith, Rondell A, MD   5,000 Units at 09/06/21 0808  ° hydrALAZINE (APRESOLINE) injection 10 mg  10 mg Intravenous Q4H PRN Smith, Rondell A, MD   10 mg at 09/05/21 0516  ° hydrALAZINE (APRESOLINE) tablet 100 mg  100 mg Oral TID Ghimire, Shanker M, MD   100 mg at 09/06/21 0819  ° insulin aspart (novoLOG) injection 0-9 Units  0-9 Units Subcutaneous TID WC Ghimire, Shanker M, MD   1 Units at 09/05/21 1759  ° labetalol (NORMODYNE) injection 10 mg  10 mg Intravenous Q4H PRN Ghimire, Shanker M, MD      ° potassium chloride (KLOR-CON) packet 40 mEq  40 mEq Oral Q4H Ghimire, Shanker M, MD   40 mEq at 09/06/21 0819  ° sodium chloride flush (NS) 0.9 % injection 3 mL  3 mL Intravenous Q12H Smith, Rondell A, MD   3 mL at 09/06/21 0824  ° spironolactone (ALDACTONE) tablet 25 mg  25 mg Oral Daily Peeples, Samuel J, MD   25 mg at 09/06/21 0819  ° sucralfate (CARAFATE) 1 GM/10ML suspension 1 g  1 g Oral TID WC & HS Smith, Rondell A, MD   1 g at 09/06/21 0819  ° ° °Allergies as of 09/04/2021  ° (No Known Allergies)  ° ° °Family History  °Problem Relation Age of Onset  ° Diabetes Mother   ° Hypertension Mother   ° Diabetes Father   ° Hypertension Father   ° Hypertension Maternal Grandmother   ° ° °Social History  ° °Socioeconomic History  ° Marital status: Married  °  Spouse name: Not on file  ° Number of children: 3  ° Years of education: 12  ° Highest education level: Not on file  °Occupational History  ° Occupation: Security  °Tobacco Use  ° Smoking status: Never  ° Smokeless tobacco: Never  °Vaping Use  ° Vaping Use: Never used  °Substance and Sexual Activity  ° Alcohol use: Yes  °    Comment: Rarely  ° Drug use: No  ° Sexual activity: Not on file  °Other Topics Concern  ° Not on file  °Social  History Narrative  ° Fun/hobby: Keeping up with children (6, 4, 14)  ° °Social Determinants of Health  ° °Financial Resource Strain: Not on file  °Food Insecurity: Not on file  °Transportation Needs: Not on file  °Physical Activity: Not on file  °Stress: Not on file  °Social Connections: Not on file  °Intimate Partner Violence: Not on file  ° ° °Review of Systems: °All systems reviewed and negative except where noted in HPI. ° ° °OBJECTIVE  ° ° °Physical Exam: °Vital signs in last 24 hours: °Temp:  [97.9 °F (36.6 °C)-99 °F (37.2 °C)] 98.2 °F (36.8 °C) (01/13 0733) °Pulse Rate:  [86-109] 93 (01/13 0733) °Resp:  [12-20] 14 (01/13 0733) °BP: (149-191)/(89-134) 190/134 (01/13 0733) °SpO2:  [95 %] 95 % (01/13 0406) °  ° °General:  Alert male in NAD °Psych:  Pleasant, cooperative. Normal mood and affect °Eyes: Pupils equal, no icterus. Conjunctive pink °Ears:  Normal auditory acuity °Nose: No deformity, discharge or lesions °Neck:  Supple, no masses felt °Lungs:  Clear to auscultation.  °Heart:  Regular rate, regular rhythm. No lower extremity edema °Abdomen:  Soft, nondistended, localized epigastric tenderness even with very mild palpation, negative Carnett's sign. Active bowel sounds, no masses felt °Rectal :  Deferred °Msk: Symmetrical without gross deformities.  °Neurologic:  Alert, oriented, grossly normal neurologically °Skin:  Intact without significant lesions.  ° ° °Scheduled inpatient medications ° amLODipine  10 mg Oral Daily  ° carvedilol  25 mg Oral BID WC  ° famotidine  20 mg Oral Daily  ° heparin  5,000 Units Subcutaneous Q8H  ° hydrALAZINE  100 mg Oral TID  ° insulin aspart  0-9 Units Subcutaneous TID WC  ° potassium chloride  40 mEq Oral Q4H  ° sodium chloride flush  3 mL Intravenous Q12H  ° spironolactone  25 mg Oral Daily  ° sucralfate  1 g Oral TID WC & HS  ° ° ° ° °Intake/Output from previous day: °No intake/output data recorded. °Intake/Output this shift: °No intake/output data recorded. ° ° °Lab  Results: °Recent Labs  °  09/04/21 °1009 09/05/21 °0144  °WBC 3.7* 4.3  °HGB 16.3 14.6  °HCT 48.8 42.5  °PLT 257 248  ° °BMET °Recent Labs  °  09/04/21 °1009 09/05/21 °0144 09/06/21 °0046  °NA 136 133* 136  °K 2.7* 3.1* 3.1*  °CL 95* 99 100  °CO2 30 24 25  °GLUCOSE 105* 158* 96  °BUN 26* 24* 26*  °CREATININE 3.29* 3.09* 3.36*  °CALCIUM 9.1 8.2* 8.6*  ° °LFTs °Recent Labs  °  09/04/21 °1009  °PROT 7.7  °ALBUMIN 4.1  °AST 28  °ALT 22  °ALKPHOS 67  °BILITOT 0.8  ° °PT/INR °No results for input(s): LABPROT, INR in the last 72 hours. °Hepatitis Panel °Recent Labs  °  09/05/21 °0144  °HEPBSAG NON REACTIVE  °HCVAB NON REACTIVE  ° ° ° °. °CBC Latest Ref Rng & Units 09/05/2021 09/04/2021 09/02/2019  °WBC 4.0 - 10.5 K/uL 4.3 3.7(L) 3.7(L)  °Hemoglobin 13.0 - 17.0 g/dL 14.6 16.3 13.9  °Hematocrit 39.0 - 52.0 % 42.5 48.8 41.7  °Platelets 150 - 400 K/uL 248 257 272.0  ° ° °. °CMP Latest Ref Rng & Units 09/06/2021 09/05/2021 09/04/2021  °Glucose 70 - 99 mg/dL 96 158(H) 105(H)  °BUN 6 - 20 mg/dL 26(H) 24(H) 26(H)  °Creatinine   0.61 - 1.24 mg/dL 3.36(H) 3.09(H) 3.29(H)  °Sodium 135 - 145 mmol/L 136 133(L) 136  °Potassium 3.5 - 5.1 mmol/L 3.1(L) 3.1(L) 2.7(LL)  °Chloride 98 - 111 mmol/L 100 99 95(L)  °CO2 22 - 32 mmol/L 25 24 30  °Calcium 8.9 - 10.3 mg/dL 8.6(L) 8.2(L) 9.1  °Total Protein 6.5 - 8.1 g/dL - - 7.7  °Total Bilirubin 0.3 - 1.2 mg/dL - - 0.8  °Alkaline Phos 38 - 126 U/L - - 67  °AST 15 - 41 U/L - - 28  °ALT 0 - 44 U/L - - 22  ° ° ° °Principal Problem: °  Hypertensive emergency °Active Problems: °  Obesity (BMI 30-39.9) °  Type 2 diabetes mellitus with other specified complication (HCC) °  Hypokalemia °  Epigastric pain °  Leukopenia °  Acute kidney injury superimposed on chronic kidney disease (HCC) °  Prolonged QT interval ° ° ° °Kada Friesen, NP-C @  09/06/2021, 10:03 AM ° ° ° ° °

## 2021-09-06 NOTE — H&P (View-Only) (Signed)
Referring Provider: [facility] PCP: [place], Betty G, MD  Gastroenterologist: Althia Forts Reason for consultation:      Abdominal pain and weight loss             ASSESSMENT / PLAN   #  41 yo male admitted with epigastric pain /unintentional weight loss since Christmas. No associated N/V. No-contrast CTAP  09/04/21  negative for acute abnormalities . Etiology of pain unclear but some degree of musculoskeletal pain suspected based on his exam.   --Recommend EGD. The risks and benefits of EGD with possible biopsies were discussed with the patient who agrees to proceed.  --Unable to proceed with procedure today as he had solids for breakfast. Additionally his BP is still elevated. Discussed with TRH, patient has resistant BP and currently at baseline. Will see was Anesthesia says when they evaluate him in am.  --Can eat today, NPO after MN --I held sq heparin after tonight's dose in anticipation of am EGD  # AKI on CKD 3b --Stopping Carafate for now, given AKI / CKD. Can decide after EGD if it is needed  # DM, A1c 6.3   # Hypertensive urgency.   # Hematospermia, chronic intermittent  # Additional medical history listed below.   HISTORY OF PRESENT ILLNESS                                                                                                                         Chief Complaint: upper abdominal pain weight loss  Paul Rivers is a 41 y.o. male with a past medical history significant for HTN, CKD, DM2, obesity. See PMH for any additional medical problems.    ED course:  Patient presented to ED 09/04/21 evaluation of intermittent upper abdominal pain worse with eating and also hypertensive ( at office).  Had seen PCP who sent him to ED  Potassium 2.7, BUN 26, creatinine 3.29 , WBC 3.7 CBC otherwise normal but MCV low normal at 81.A1c, 6.3  Renal US suggests medical renal disease.   Cha says he started having intermittent upper abdominal discomfort around  Christmas It is a dull ache, non-radiating. It is exacerbated by eating but also occurs at random times. Discomfort not related to movement.  No associated nausea / vomiting. He doesn't take NSAIDS. No black stools or blood in stools. He denies bowel changes. He very occasionally has reflux, doesn't take any reflux medications at home.   No [place] of gastric, pancreatic or colon cancers  [place] says he is pre-diabetic. He has frequent urination at home. A1c is 6.3.    ENDOSCOPIC EVALUATIONS   home    PREVIOUS IMAGING  Non-contrast CTAP - no acute findings.    Past Medical History:  Diagnosis Date   Diabetes mellitus without complication ([place])    Hyperlipidemia    Hypertension    Prolonged QT interval 09/05/2021    History reviewed. No pertinent surgical history.  Prior to Admission medications   Medication Sig Start Date End  Date Taking? Authorizing Provider  amLODipine (NORVASC) 10 MG tablet Take 1 tablet (10 mg total) by mouth daily. 12/07/19  Yes [place], Betty G, MD  carvedilol (COREG) 25 MG tablet Take 1 tablet by mouth twice daily Patient taking differently: Take 25 mg by mouth 2 (two) times daily with a meal. 06/17/21  Yes [place], Betty G, MD  hydrALAZINE (APRESOLINE) 50 MG tablet TAKE 1 TABLET BY MOUTH THREE TIMES DAILY Patient taking differently: Take 50 mg by mouth 3 (three) times daily. 07/08/21  Yes [place], Betty G, MD  Blood Glucose Monitoring Suppl (ACCU-CHEK AVIVA CONNECT) w/Device KIT 1 Device by Does not apply route daily. 07/04/19   [place], Betty G, MD    Current Facility-Administered Medications  Medication Dose Route Frequency Provider Last Rate Last Admin   acetaminophen (TYLENOL) tablet 650 mg  650 mg Oral Q6H PRN Norval Morton, MD       Or   acetaminophen (TYLENOL) suppository 650 mg  650 mg Rectal Q6H PRN Fuller Plan A, MD       amLODipine (NORVASC) tablet 10 mg  10 mg Oral Daily Tamala Julian, Rondell A, MD   10 mg at 09/06/21 0819   carvedilol (COREG) tablet  25 mg  25 mg Oral BID WC Smith, Rondell A, MD   25 mg at 09/06/21 0820   famotidine (PEPCID) tablet 20 mg  20 mg Oral Daily Jonetta Osgood, MD   20 mg at 09/06/21 0819   heparin injection 5,000 Units  5,000 Units Subcutaneous Q8H Fuller Plan A, MD   5,000 Units at 09/06/21 9407   hydrALAZINE (APRESOLINE) injection 10 mg  10 mg Intravenous Q4H PRN Fuller Plan A, MD   10 mg at 09/05/21 0516   hydrALAZINE (APRESOLINE) tablet 100 mg  100 mg Oral TID Jonetta Osgood, MD   100 mg at 09/06/21 0819   insulin aspart (novoLOG) injection 0-9 Units  0-9 Units Subcutaneous TID WC Jonetta Osgood, MD   1 Units at 09/05/21 1759   labetalol (NORMODYNE) injection 10 mg  10 mg Intravenous Q4H PRN Jonetta Osgood, MD       potassium chloride (KLOR-CON) packet 40 mEq  40 mEq Oral Q4H Jonetta Osgood, MD   40 mEq at 09/06/21 6808   sodium chloride flush (NS) 0.9 % injection 3 mL  3 mL Intravenous Q12H Fuller Plan A, MD   3 mL at 09/06/21 8110   spironolactone (ALDACTONE) tablet 25 mg  25 mg Oral Daily Reesa Chew, MD   25 mg at 09/06/21 0819   sucralfate (CARAFATE) 1 GM/10ML suspension 1 g  1 g Oral TID WC & HS Fuller Plan A, MD   1 g at 09/06/21 0819    Allergies as of 09/04/2021   (No Known Allergies)    Family History  Problem Relation Age of Onset   Diabetes Mother    Hypertension Mother    Diabetes Father    Hypertension Father    Hypertension Maternal Grandmother     Social History   Socioeconomic History   Marital status: Married    Spouse name: Not on file   Number of children: 3   Years of education: 12   Highest education level: Not on file  Occupational History   Occupation: Security  Tobacco Use   Smoking status: Never   Smokeless tobacco: Never  Vaping Use   Vaping Use: Never used  Substance and Sexual Activity   Alcohol use: Yes  Comment: Rarely   Drug use: No   Sexual activity: Not on file  Other Topics Concern   Not on file  Social  History Narrative   Fun/hobby: Keeping up with children (6, 4, 14)   Social Determinants of Health   Financial Resource Strain: Not on file  Food Insecurity: Not on file  Transportation Needs: Not on file  Physical Activity: Not on file  Stress: Not on file  Social Connections: Not on file  Intimate Partner Violence: Not on file    Review of Systems: All systems reviewed and negative except where noted in HPI.   OBJECTIVE    Physical Exam: Vital signs in last 24 hours: Temp:  [97.9 F (36.6 C)-99 F (37.2 C)] 98.2 F (36.8 C) (01/13 0733) Pulse Rate:  [86-109] 93 (01/13 0733) Resp:  [12-20] 14 (01/13 0733) BP: (149-191)/(89-134) 190/134 (01/13 0733) SpO2:  [95 %] 95 % (01/13 0406)    General:  Alert male in NAD Psych:  Pleasant, cooperative. Normal mood and affect Eyes: Pupils equal, no icterus. Conjunctive pink Ears:  Normal auditory acuity Nose: No deformity, discharge or lesions Neck:  Supple, no masses felt Lungs:  Clear to auscultation.  Heart:  Regular rate, regular rhythm. No lower extremity edema Abdomen:  Soft, nondistended, localized epigastric tenderness even with very mild palpation, negative Carnett's sign. Active bowel sounds, no masses felt Rectal :  Deferred Msk: Symmetrical without gross deformities.  Neurologic:  Alert, oriented, grossly normal neurologically Skin:  Intact without significant lesions.    Scheduled inpatient medications  amLODipine  10 mg Oral Daily   carvedilol  25 mg Oral BID WC   famotidine  20 mg Oral Daily   heparin  5,000 Units Subcutaneous Q8H   hydrALAZINE  100 mg Oral TID   insulin aspart  0-9 Units Subcutaneous TID WC   potassium chloride  40 mEq Oral Q4H   sodium chloride flush  3 mL Intravenous Q12H   spironolactone  25 mg Oral Daily   sucralfate  1 g Oral TID WC & HS      Intake/Output from previous day: No intake/output data recorded. Intake/Output this shift: No intake/output data recorded.   Lab  Results: Recent Labs    09/04/21 1009 09/05/21 0144  WBC 3.7* 4.3  HGB 16.3 14.6  HCT 48.8 42.5  PLT 257 248   BMET Recent Labs    09/04/21 1009 09/05/21 0144 09/06/21 0046  NA 136 133* 136  K 2.7* 3.1* 3.1*  CL 95* 99 100  CO2 _0 GLUCOSE 105* 158* 96  BUN 26* 24* 26*  CREATININE 3.29* 3.09* 3.36*  CALCIUM 9.1 8.2* 8.6*   LFTs Recent Labs    09/04/21 1009  PROT 7.7  ALBUMIN 4.1  AST 28  ALT 22  ALKPHOS 67  BILITOT 0.8   PT/INR No results for input(s): LABPROT, INR in the last 72 hours. Hepatitis Panel Recent Labs    09/05/21 0144  HEPBSAG NON REACTIVE  HCVAB NON REACTIVE     . CBC Latest Ref Rng & Units 09/05/2021 09/04/2021 09/02/2019  WBC 4.0 - 10.5 K/uL 4.3 3.7(L) 3.7(L)  Hemoglobin 13.0 - 17.0 g/dL 14.6 16.3 13.9  Hematocrit 39.0 - 52.0 % 42.5 48.8 41.7  Platelets 150 - 400 K/uL 248 257 272.0    . CMP Latest Ref Rng & Units 09/06/2021 09/05/2021 09/04/2021  Glucose 70 - 99 mg/dL 96 158(H) 105(H)  BUN 6 - 20 mg/dL 26(H) 24(H) 26(H)  Creatinine  0.61 - 1.24 mg/dL 3.36(H) 3.09(H) 3.29(H)  Sodium 135 - 145 mmol/L 136 133(L) 136  Potassium 3.5 - 5.1 mmol/L 3.1(L) 3.1(L) 2.7(LL)  Chloride 98 - 111 mmol/L 100 99 95(L)  CO2 22 - 32 mmol/L _0 Calcium 8.9 - 10.3 mg/dL 8.6(L) 8.2(L) 9.1  Total Protein 6.5 - 8.1 g/dL - - 7.7  Total Bilirubin 0.3 - 1.2 mg/dL - - 0.8  Alkaline Phos 38 - 126 U/L - - 67  AST 15 - 41 U/L - - 28  ALT 0 - 44 U/L - - 22     Principal Problem:   Hypertensive emergency Active Problems:   Obesity (BMI 30-39.9)   Type 2 diabetes mellitus with other specified complication (HCC)   Hypokalemia   Epigastric pain   Leukopenia   Acute kidney injury superimposed on chronic kidney disease (HCC)   Prolonged QT interval    Paul Savoy, NP-C @  09/06/2021, 10:03 AM

## 2021-09-06 NOTE — Progress Notes (Signed)
[facility] Progress Note    Assessment/ Plan:   Assessment/Recommendations: Paul Rivers is a/an 41 y.o. male with a past medical history HTN, DM2, obesity who present w/ Epigastric discomfort and HTN   Severe uncontrolled HTN: long standing and likely difficult to control. Severe elevation 2/2 inability to tolerate meds for two weeks. He has had a fairly appropriate reduction in his BP over the past 24 hours. Will continue with slow and steady improvement to hopefully avoid end organ damage -Continue norvasc 10mg  daily, coreg 25mg  BID, hydral 100mg  TID - continue spiro 25mg  daily -F/u renin/aldo given severe HTN and hypokalemia; although unlikely primary hyperaldo given reassuring renin in 2020 -No > than 30% reduction in BP every 24 hours -Follow-up urine drug screen- negative - will need to see [place] in followup- can help arrange    Tachycardia: some likely reflexive from significant vasodilatory agent use. Not a major concern. Likely will stabilize.   Hypokalemia: Possibly related to poor p.o. intake but no vomiting.  Magnesium normal. Evaluating for hyperaldosteronism as above.  Continue with replacement.  Spironolactone as above   AKI vs progressive CKD 3b: Crt ~2 in February of 2022 in careeverywhere. Lab data fairly limited as of late. Likely has some small component of AKI but more of this may be CKD.  -HTN mgmt as above -Continue to monitor daily Cr, Dose meds for GFR -Monitor Daily I/Os, Daily weight  -Maintain MAP>65 for optimal renal perfusion.  -Avoid nephrotoxic medications including NSAIDs -Currently no indication for HD   Proteinuria: Can be seen in the setting of severe hypertension.  Will follow-up quantification but likely will improve with hypertension control.  Only 1.2 g.   Weight Loss/Epigastric abd pain: mgmt and w/u per primary--> getting EGD   DM2: recent a1c 6.3. Now managed with lifestyle changes. mgmt per primary  Subjective:    For  EGD today.  BP is better on spironolactone.  Still awaiting renin/ aldo   Objective:   BP (!) 151/103    Pulse 93    Temp 98.2 F (36.8 C) (Oral)    Resp 16    Ht 5\' 3"  (1.6 m)    Wt 91.3 kg    SpO2 95%    BMI 35.66 kg/m  No intake or output data in the 24 hours ending 09/06/21 1323 Weight change:   Physical Exam: Gen:NAD CVS:RRR loud S2 Resp: clear Abd: soft Ext: no LE edema  Imaging: US RENAL  Result Date: 09/05/2021 CLINICAL DATA:  Renal dysfunction EXAM: RENAL / URINARY TRACT ULTRASOUND COMPLETE COMPARISON:  CT done on 09/04/2021 FINDINGS: Right Kidney: Renal measurements: 9.1 x 5.4 x 6 cm = volume: 154.7 mL. There is marked increase in the cortical echogenicity. There is no hydronephrosis. Left Kidney: Renal measurements: 8.8 x 4.9 x 3.9 cm = volume: 87.3 mL. There is marked increase in echogenicity in the renal cortex. There is no hydronephrosis. Bladder: Appears normal for degree of bladder distention. Other: None. IMPRESSION: There is no hydronephrosis. There is marked increase in cortical echogenicity in both kidneys suggesting medical renal disease. Electronically Signed   By: Elmer Picker M.D.   On: 09/05/2021 16:52    Labs: BMET Recent Labs  Lab 09/04/21 1009 09/05/21 0144 09/06/21 0046  NA 136 133* 136  K 2.7* 3.1* 3.1*  CL 95* 99 100  CO2 30 24 25   GLUCOSE 105* 158* 96  BUN 26* 24* 26*  CREATININE 3.29* 3.09* 3.36*  CALCIUM 9.1 8.2* 8.6*  CBC Recent Labs  Lab 09/04/21 1009 09/05/21 0144  WBC 3.7* 4.3  NEUTROABS 2.2  --   HGB 16.3 14.6  HCT 48.8 42.5  MCV 82.3 81.1  PLT 257 248    Medications:     amLODipine  10 mg Oral Daily   carvedilol  25 mg Oral BID WC   heparin  5,000 Units Subcutaneous Q8H   hydrALAZINE  100 mg Oral TID   insulin aspart  0-9 Units Subcutaneous TID WC   pantoprazole  40 mg Oral Q1200   sodium chloride flush  3 mL Intravenous Q12H   spironolactone  25 mg Oral Daily      Madelon Lips, MD 09/06/2021, 1:23 PM

## 2021-09-06 NOTE — Progress Notes (Signed)
PROGRESS NOTE        PATIENT DETAILS Name: Paul Rivers Age: 41 y.o. Sex: male Date of Birth: 1980-08-28 Admit Date: 09/04/2021 Admitting Physician Norval Morton, MD JOI:NOMVEH, Malka So, MD  Brief Narrative: Patient is a 42 y.o. male with history of HTN, HLD, DM-2, obesity who presented to the ED with epigastric pain/decreased appetite/weight loss for the past several weeks-he stopped taking his antihypertensive medications due to epigastric pain-he was subsequently found to have hypertensive emergency, and AKI.  He was subsequently admitted to the hospitalist service.    Subjective: Blood pressure is much better-continues to have epigastric pain with oral intake.  Objective: Vitals: Blood pressure (!) 171/109, pulse 93, temperature 98.2 F (36.8 C), temperature source Oral, resp. rate 11, height 5\' 3"  (1.6 m), weight 91.3 kg, SpO2 95 %.   Exam: Gen Exam:Alert awake-not in any distress HEENT:atraumatic, normocephalic Chest: B/L clear to auscultation anteriorly CVS:S1S2 regular Abdomen:soft non tender, non distended Extremities:no edema Neurology: Non focal Skin: no rash   Pertinent Labs/Radiology: Recent Labs  Lab 09/04/21 1009 09/05/21 0144 09/06/21 0046  WBC 3.7* 4.3  --   HGB 16.3 14.6  --   PLT 257 248  --   NA 136 133* 136  K 2.7* 3.1* 3.1*  CREATININE 3.29* 3.09* 3.36*  AST 28  --   --   ALT 22  --   --   ALKPHOS 67  --   --   BILITOT 0.8  --   --     Assessment/Plan: * Hypertensive emergency- (present on admission) Due to noncompliance in the setting of abdominal pain-he also has whitecoat hypertension at baseline-and has a longstanding history of resistant hypertension.  Prior work-up in 2020 for secondary causes was negative.  BP seems to have improved-continue Coreg/hydralazine/amlodipine and Aldactone.       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  No hydronephrosis seen on imaging studies.  Hepatitis B and C serology negative.  HIV negative.  Creatinine slightly elevated today-but this could be due to better blood pressure control.  Continue to avoid nephrotoxic agents.  Appreciate nephrology input  Lab Results  Component Value Date   CREATININE 3.09 (H) 09/05/2021    Epigastric pain- (present on admission) Likely peptic ulcer disease/duodenal ulcer given that epigastric pain worsens with food.  Has had approximately-30-40 pound weight loss since mid December.  No history of NSAID use/EtOH use/smoking.  GI consulted-since QTC punctation has resolved-we will start PPI and stop Pepcid.  Remains on Carafate.   Type 2 diabetes mellitus with other specified complication ([place])- (present on admission)  Not on any oral diabetic medication/insulin therapy since 2018 (when he was diagnosed with DM).  Per patient-he has been able to manage his diabetes with diet/lifestyle modification.  Unclear whether his underlying CKD is from DM at this point.  CBGs stable with SSI-follow.  Lab Results  Component Value Date   HGBA1C 6.3 (H) 09/05/2021    Recent Labs    09/05/21 0755  GLUCAP 137*    Leukopenia- (present on admission) Resolved-if reoccurs-needs outpatient follow-up with hematology for further evaluation.  Hypokalemia- (present on admission) Likely due to GI losses/poor oral intake-has been started on Aldactone.  Due to resistant hypertension-nephrology has  ordered aldosterone/renin ratio-which is currently pending.  Continue to replete potassium.  Prolonged QT interval- (present on admission) Likely due to hypokalemia-has resolved-EKG with stable QTC today.  .  Obesity Estimated body mass index is 35.66 kg/m as calculated from the following:   Height as of this encounter: 5\' 3"  (1.6 m).   Weight as of this encounter: 91.3  kg.   Procedures: None Consults: None DVT Prophylaxis: SQ Heparin Code Status:Full code Family Communication: Spouse at bedside   Disposition Plan: Status is: Inpatient  Remains inpatient appropriate because: Uncontrolled hypertension slowly improving-AKI improving but not yet at baseline.  Needs another 1-2 days of continued inpatient monitoring before consideration of discharge.   Diet: Diet Order             Diet NPO time specified Except for: Sips with Meds  Diet effective midnight           Diet heart healthy/carb modified Room service appropriate? Yes; Fluid consistency: Thin  Diet effective now                     Antimicrobial agents: Anti-infectives (From admission, onward)    None        MEDICATIONS: Scheduled Meds:  amLODipine  10 mg Oral Daily   carvedilol  25 mg Oral BID WC   famotidine  20 mg Oral Daily   heparin  5,000 Units Subcutaneous Q8H   hydrALAZINE  100 mg Oral TID   insulin aspart  0-9 Units Subcutaneous TID WC   sodium chloride flush  3 mL Intravenous Q12H   spironolactone  25 mg Oral Daily   Continuous Infusions:   PRN Meds:.acetaminophen **OR** acetaminophen, hydrALAZINE, labetalol   I have personally reviewed following labs and imaging studies  LABORATORY DATA: CBC: Recent Labs  Lab 09/04/21 1009 09/05/21 0144  WBC 3.7* 4.3  NEUTROABS 2.2  --   HGB 16.3 14.6  HCT 48.8 42.5  MCV 82.3 81.1  PLT 257 409    Basic Metabolic Panel: Recent Labs  Lab 09/04/21 1009 09/05/21 0144 09/06/21 0046  NA 136 133* 136  K 2.7* 3.1* 3.1*  CL 95* 99 100  CO2 30 24 25   GLUCOSE 105* 158* 96  BUN 26* 24* 26*  CREATININE 3.29* 3.09* 3.36*  CALCIUM 9.1 8.2* 8.6*  MG 2.3 2.2 2.1    GFR: Estimated Creatinine Clearance: 29.5 mL/min (A) (by C-G formula based on SCr of 3.36 mg/dL (H)).  Liver Function Tests: Recent Labs  Lab 09/04/21 1009  AST 28  ALT 22  ALKPHOS 67  BILITOT 0.8  PROT 7.7  ALBUMIN 4.1   Recent Labs   Lab 09/04/21 1009  LIPASE 36   No results for input(s): AMMONIA in the last 168 hours.  Coagulation Profile: No results for input(s): INR, PROTIME in the last 168 hours.  Cardiac Enzymes: No results for input(s): CKTOTAL, CKMB, CKMBINDEX, TROPONINI in the last 168 hours.  BNP (last 3 results) No results for input(s): PROBNP in the last 8760 hours.  Lipid Profile: No results for input(s): CHOL, HDL, LDLCALC, TRIG, CHOLHDL, LDLDIRECT in the last 72 hours.  Thyroid Function Tests: No results for input(s): TSH, T4TOTAL, FREET4, T3FREE, THYROIDAB in the last 72 hours.  Anemia Panel: No results for input(s): VITAMINB12, FOLATE, FERRITIN, TIBC, IRON, RETICCTPCT in the last 72 hours.  Urine analysis:    Component Value Date/Time   COLORURINE YELLOW 09/04/2021 1110   APPEARANCEUR CLEAR 09/04/2021 1110   LABSPEC 1.020 09/04/2021 1110  PHURINE 7.0 09/04/2021 1110   GLUCOSEU NEGATIVE 09/04/2021 1110   HGBUR SMALL (A) 09/04/2021 1110   BILIRUBINUR NEGATIVE 09/04/2021 1110   KETONESUR NEGATIVE 09/04/2021 1110   PROTEINUR >300 (A) 09/04/2021 1110   NITRITE NEGATIVE 09/04/2021 1110   LEUKOCYTESUR NEGATIVE 09/04/2021 1110    Sepsis Labs: Lactic Acid, Venous No results found for: LATICACIDVEN  MICROBIOLOGY: Recent Results (from the past 240 hour(s))  Resp Panel by RT-PCR (Flu A&B, Covid) Nasopharyngeal Swab     Status: None   Collection Time: 09/04/21  2:15 PM   Specimen: Nasopharyngeal Swab; Nasopharyngeal(NP) swabs in vial transport medium  Result Value Ref Range Status   SARS Coronavirus 2 by RT PCR NEGATIVE NEGATIVE Final    Comment: (NOTE) SARS-CoV-2 target nucleic acids are NOT DETECTED.  The SARS-CoV-2 RNA is generally detectable in upper respiratory specimens during the acute phase of infection. The lowest concentration of SARS-CoV-2 viral copies this assay can detect is 138 copies/mL. A negative result does not preclude SARS-Cov-2 infection and should not be used  as the sole basis for treatment or other patient management decisions. A negative result may occur with  improper specimen collection/handling, submission of specimen other than nasopharyngeal swab, presence of viral mutation(s) within the areas targeted by this assay, and inadequate number of viral copies(<138 copies/mL). A negative result must be combined with clinical observations, patient history, and epidemiological information. The expected result is Negative.  Fact Sheet for Patients:  EntrepreneurPulse.com.au  Fact Sheet for Healthcare Providers:  IncredibleEmployment.be  This test is no t yet approved or cleared by the [place] FDA and  has been authorized for detection and/or diagnosis of SARS-CoV-2 by FDA under an Emergency Use Authorization (EUA). This EUA will remain  in effect (meaning this test can be used) for the duration of the COVID-19 declaration under Section 564(b)(1) of the Act, 21 U.S.C.section 360bbb-3(b)(1), unless the authorization is terminated  or revoked sooner.       Influenza A by PCR NEGATIVE NEGATIVE Final   Influenza B by PCR NEGATIVE NEGATIVE Final    Comment: (NOTE) The Xpert Xpress SARS-CoV-2/FLU/RSV plus assay is intended as an aid in the diagnosis of influenza from Nasopharyngeal swab specimens and should not be used as a sole basis for treatment. Nasal washings and aspirates are unacceptable for Xpert Xpress SARS-CoV-2/FLU/RSV testing.  Fact Sheet for Patients: EntrepreneurPulse.com.au  Fact Sheet for Healthcare Providers: IncredibleEmployment.be  This test is not yet approved or cleared by the [place] FDA and has been authorized for detection and/or diagnosis of SARS-CoV-2 by FDA under an Emergency Use Authorization (EUA). This EUA will remain in effect (meaning this test can be used) for the duration of the COVID-19 declaration under Section 564(b)(1)  of the Act, 21 U.S.C. section 360bbb-3(b)(1), unless the authorization is terminated or revoked.  Performed at [facility], [place] [address] [place], [place] [address]     RADIOLOGY STUDIES/RESULTS: US RENAL  Result Date: 09/05/2021 CLINICAL DATA:  Renal dysfunction EXAM: RENAL / URINARY TRACT ULTRASOUND COMPLETE COMPARISON:  CT done on 09/04/2021 FINDINGS: Right Kidney: Renal measurements: 9.1 x 5.4 x 6 cm = volume: 154.7 mL. There is marked increase in the cortical echogenicity. There is no hydronephrosis. Left Kidney: Renal measurements: 8.8 x 4.9 x 3.9 cm = volume: 87.3 mL. There is marked increase in echogenicity in the renal cortex. There is no hydronephrosis. Bladder: Appears normal for degree of bladder distention. Other: None. IMPRESSION: There is no hydronephrosis. There is marked increase  in cortical echogenicity in both kidneys suggesting medical renal disease. Electronically Signed   By: Elmer Picker M.D.   On: 09/05/2021 16:52     LOS: 2 days   Oren Binet, MD  [facility]    To contact the attending provider between 7A-7P or the covering provider during after hours 7P-7A, please log into the web site [email or web address] and access using universal [place] password for that web site. If you do not have the password, please call the hospital operator.  09/06/2021, 12:13 PM

## 2021-09-07 ENCOUNTER — Inpatient Hospital Stay (HOSPITAL_COMMUNITY): Payer: Commercial Managed Care - PPO | Admitting: Certified Registered Nurse Anesthetist

## 2021-09-07 ENCOUNTER — Encounter (HOSPITAL_COMMUNITY)
Admission: EM | Disposition: A | Discharge status: Home / Self Care | Payer: Self-pay | Source: Ambulatory Visit | Attending: Internal Medicine

## 2021-09-07 ENCOUNTER — Encounter (HOSPITAL_COMMUNITY): Payer: Self-pay | Admitting: Internal Medicine

## 2021-09-07 DIAGNOSIS — E876 Hypokalemia: Secondary | ICD-10-CM | POA: Diagnosis not present

## 2021-09-07 DIAGNOSIS — I161 Hypertensive emergency: Secondary | ICD-10-CM | POA: Diagnosis not present

## 2021-09-07 DIAGNOSIS — K253 Acute gastric ulcer without hemorrhage or perforation: Secondary | ICD-10-CM

## 2021-09-07 DIAGNOSIS — K2970 Gastritis, unspecified, without bleeding: Secondary | ICD-10-CM

## 2021-09-07 DIAGNOSIS — N179 Acute kidney failure, unspecified: Secondary | ICD-10-CM | POA: Diagnosis not present

## 2021-09-07 HISTORY — PX: BIOPSY: SHX5522

## 2021-09-07 HISTORY — PX: ESOPHAGOGASTRODUODENOSCOPY (EGD) WITH PROPOFOL: SHX5813

## 2021-09-07 LAB — BASIC METABOLIC PANEL
Anion gap: 10 (ref 5–15)
BUN: 33 mg/dL — ABNORMAL HIGH (ref 6–20)
CO2: 25 mmol/L (ref 22–32)
Calcium: 8.7 mg/dL — ABNORMAL LOW (ref 8.9–10.3)
Chloride: 101 mmol/L (ref 98–111)
Creatinine, Ser: 3.49 mg/dL — ABNORMAL HIGH (ref 0.61–1.24)
GFR, Estimated: 22 mL/min — ABNORMAL LOW (ref >60)
Glucose, Bld: 81 mg/dL (ref 70–99)
Potassium: 3.2 mmol/L — ABNORMAL LOW (ref 3.5–5.1)
Sodium: 136 mmol/L (ref 135–145)

## 2021-09-07 LAB — MAGNESIUM: Magnesium: 2.3 mg/dL (ref 1.7–2.4)

## 2021-09-07 LAB — GLUCOSE, CAPILLARY
Glucose-Capillary: 108 mg/dL — ABNORMAL HIGH (ref 70–99)
Glucose-Capillary: 89 mg/dL (ref 70–99)

## 2021-09-07 SURGERY — ESOPHAGOGASTRODUODENOSCOPY (EGD) WITH PROPOFOL
Anesthesia: Monitor Anesthesia Care

## 2021-09-07 MED ORDER — AMLODIPINE BESYLATE 10 MG PO TABS: 10.0000 mg | ORAL_TABLET | Freq: Every day | ORAL | 3 refills | Status: DC

## 2021-09-07 MED ORDER — PROPOFOL 10 MG/ML IV BOLUS
INTRAVENOUS | Status: DC | PRN
  Administered 2021-09-07 (×5): 20 mg via INTRAVENOUS

## 2021-09-07 MED ORDER — HYDRALAZINE HCL 50 MG PO TABS: 100.0000 mg | ORAL_TABLET | Freq: Three times a day (TID) | ORAL | 3 refills | Status: DC

## 2021-09-07 MED ORDER — LABETALOL HCL 5 MG/ML IV SOLN
20.0000 mg | Freq: Once | INTRAVENOUS | Status: AC
  Administered 2021-09-07: 20 mg via INTRAVENOUS

## 2021-09-07 MED ORDER — PANTOPRAZOLE SODIUM 40 MG PO TBEC
40.0000 mg | DELAYED_RELEASE_TABLET | Freq: Two times a day (BID) | ORAL | Status: DC
  Filled 2021-09-07: qty 1

## 2021-09-07 MED ORDER — SPIRONOLACTONE 25 MG PO TABS: 25.0000 mg | ORAL_TABLET | Freq: Every day | ORAL | 2 refills | Status: AC

## 2021-09-07 MED ORDER — PANTOPRAZOLE SODIUM 40 MG PO TBEC: 40.0000 mg | DELAYED_RELEASE_TABLET | Freq: Two times a day (BID) | ORAL | 3 refills | Status: DC

## 2021-09-07 MED ORDER — LIDOCAINE 2% (20 MG/ML) 5 ML SYRINGE
INTRAMUSCULAR | Status: DC | PRN
Start: 2021-09-07 — End: 2021-09-07
  Administered 2021-09-07: 60 mg via INTRAVENOUS

## 2021-09-07 MED ORDER — CARVEDILOL 25 MG PO TABS: 25.0000 mg | ORAL_TABLET | Freq: Two times a day (BID) | ORAL | 3 refills | Status: DC

## 2021-09-07 MED ORDER — SODIUM CHLORIDE 0.9 % IV SOLN
INTRAVENOUS | Status: DC | PRN
Start: 2021-09-07 — End: 2021-09-07

## 2021-09-07 MED ORDER — PROPOFOL 500 MG/50ML IV EMUL
INTRAVENOUS | Status: DC | PRN
  Administered 2021-09-07: 150 ug/kg/min via INTRAVENOUS

## 2021-09-07 MED ORDER — LABETALOL HCL 5 MG/ML IV SOLN
INTRAVENOUS | Status: AC
  Filled 2021-09-07: qty 4

## 2021-09-07 SURGICAL SUPPLY — 15 items

## 2021-09-07 NOTE — Interval H&P Note (Signed)
History and Physical Interval Note: For EGD today to evaluate epigastric pain and weight loss.  Blood pressure remains elevated but anesthesia is pretreating this with labetalol prior to sedation.  He denies chest pain, shortness of breath, headache, change in vision and dizziness. We reviewed the risk, benefits and alternatives to upper endoscopy and he is agreeable and wishes to proceed.    09/07/2021 7:59 AM  Paul Rivers  has presented today for surgery, with the diagnosis of epigastric pain, weight loss.  The various methods of treatment have been discussed with the patient and family. After consideration of risks, benefits and other options for treatment, the patient has consented to  Procedure(s): ESOPHAGOGASTRODUODENOSCOPY (EGD) WITH PROPOFOL (N/A) as a surgical intervention.  The patient's history has been reviewed, patient examined, no change in status, stable for surgery.  I have reviewed the patient's chart and labs.  Questions were answered to the patient's satisfaction.     Lajuan Lines Marquay Kruse

## 2021-09-07 NOTE — Anesthesia Preprocedure Evaluation (Addendum)
Anesthesia Evaluation  Patient identified by MRN, date of birth, ID band Patient awake    Reviewed: Allergy & Precautions, NPO status , Patient's Chart, lab work & pertinent test results  Airway Mallampati: II  TM Distance: >3 FB Neck ROM: Full    Dental   Pulmonary neg pulmonary ROS,    breath sounds clear to auscultation       Cardiovascular hypertension (Poorly controlled), Pt. on medications and Pt. on home beta blockers  Rhythm:Regular Rate:Normal     Neuro/Psych negative neurological ROS     GI/Hepatic Neg liver ROS, PUD,   Endo/Other  diabetes, Type 2  Renal/GU CRF and ARFRenal disease     Musculoskeletal   Abdominal   Peds  Hematology negative hematology ROS (+)   Anesthesia Other Findings   Reproductive/Obstetrics                            Anesthesia Physical Anesthesia Plan  ASA: 3  Anesthesia Plan: MAC   Post-op Pain Management: Minimal or no pain anticipated   Induction:   PONV Risk Score and Plan: 1 and Propofol infusion and Treatment may vary due to age or medical condition  Airway Management Planned: Natural Airway and Nasal Cannula  Additional Equipment:   Intra-op Plan:   Post-operative Plan:   Informed Consent: I have reviewed the patients History and Physical, chart, labs and discussed the procedure including the risks, benefits and alternatives for the proposed anesthesia with the patient or authorized representative who has indicated his/her understanding and acceptance.       Plan Discussed with: CRNA  Anesthesia Plan Comments:         Anesthesia Quick Evaluation

## 2021-09-07 NOTE — Transfer of Care (Signed)
Immediate Anesthesia Transfer of Care Note  Patient: Paul Rivers  Procedure(s) Performed: ESOPHAGOGASTRODUODENOSCOPY (EGD) WITH PROPOFOL  Patient Location: PACU  Anesthesia Type:MAC  Level of Consciousness: sedated  Airway & Oxygen Therapy: Patient Spontanous Breathing and Patient connected to nasal cannula oxygen  Post-op Assessment: Report given to RN and Post -op Vital signs reviewed and stable  Post vital signs: Reviewed and stable  Last Vitals:  Vitals Value Taken Time  BP 128/90 09/07/21 0827  Temp    Pulse 76 09/07/21 0829  Resp 19 09/07/21 0829  SpO2 99 % 09/07/21 0829  Vitals shown include unvalidated device data.  Last Pain:  Vitals:   09/07/21 0726  TempSrc: Temporal  PainSc: 0-No pain         Complications: No notable events documented.

## 2021-09-07 NOTE — Assessment & Plan Note (Signed)
Will be on PPI-see above.

## 2021-09-07 NOTE — Procedures (Addendum)
I spoke to the patient's wife by phone after procedures to update her on findings and plans. Time provided for questions and answers and she thanked me for the call. Ulcer treatment and if persistent symptoms then additional outpt GI eval including GES  Call if questions JMP

## 2021-09-07 NOTE — Progress Notes (Signed)
Pt given discharge instructions, prescriptions, and care notes. Pt verbalized understanding AEB no further questions or concerns at this time. IV was discontinued, no redness, pain, or swelling noted at this time. Telemetry discontinued and Centralized Telemetry was notified. Pt left the floor via wheelchair with staff in stable condition. 

## 2021-09-07 NOTE — Op Note (Signed)
[facility] Patient Name: Paul Rivers Procedure Date : 09/07/2021 MRN: [id number] Attending MD: Jerene Bears , MD Date of Birth: 1981/06/01 CSN: [id number] Age: 41 Admit Type: Inpatient Procedure:                Upper GI endoscopy Indications:              Epigastric abdominal pain, Weight loss Providers:                Lajuan Lines. Hilarie Fredrickson, MD, Jeanella Cara, RN,                            Ladona Ridgel, Technician, Clearnce Sorrel, CRNA Referring MD:             [facility] Medicines:                Monitored Anesthesia Care Complications:            No immediate complications. Estimated Blood Loss:     Estimated blood loss was minimal. Procedure:                Pre-Anesthesia Assessment:                           - Prior to the procedure, a History and Physical                            was performed, and patient medications and                            allergies were reviewed. The patient's tolerance of                            previous anesthesia was also reviewed. The risks                            and benefits of the procedure and the sedation                            options and risks were discussed with the patient.                            All questions were answered, and informed consent                            was obtained. Prior Anticoagulants: The patient has                            taken no previous anticoagulant or antiplatelet                            agents. ASA Grade Assessment: III - A patient with                            severe systemic disease. After reviewing the risks  and benefits, the patient was deemed in                            satisfactory condition to undergo the procedure.                           After obtaining informed consent, the endoscope was                            passed under direct vision. Throughout the                            procedure, the patient's blood  pressure, pulse, and                            oxygen saturations were monitored continuously. The                            [id number]-[id number] ([id number] Olympus endoscope was introduced                            through the mouth, and advanced to the second part                            of duodenum. The upper GI endoscopy was                            accomplished without difficulty. The patient                            tolerated the procedure well. Scope In: Scope Out: Findings:      The examined esophagus was normal.      The gastroesophageal flap valve was visualized endoscopically and       classified as Hill Grade II (fold present, opens with respiration).      Scattered moderate inflammation characterized by erosions and erythema       was found in the gastric body, in the gastric antrum and in the       prepyloric region of the stomach. There is an ulceration (clear based)       in the pre-pyloric gastric antrum. Biopsies were taken with a cold       forceps for histology and Helicobacter pylori testing (Jar 2 -       ulcer/antrum; Jar 3 = gastric body, incisura).      The examined duodenum was normal. Biopsies were taken with a cold       forceps for histology (Jar 1). Impression:               - Normal esophagus.                           - Gastritis with scattered erosions. Biopsied.                           - Gastric ulcer (antrum). Biopsied.                           -  Normal examined duodenum. Biopsied. Moderate Sedation:      N/A Recommendation:           - Return patient to hospital ward for ongoing care.                           - Resume previous diet.                           - Continue present medications.                           - Pantoprazole 40 mg twice daily at least 30 min                            before 1st and last meal of the day x 8 weeks.                           - Avoid ibuprofen, naproxen, or other non-steroidal                             anti-inflammatory drugs.                           - Await pathology results. Follow-up H. Pylori                            status and treat if present.                           - If symptoms continue after ulcer treatment                            consider gastric emptying study. Outpatient GI                            follow-up can be arranged for persistent symptoms. Procedure Code(s):        --- Professional ---                           [id number], Esophagogastroduodenoscopy, flexible,                            transoral; with biopsy, single or multiple Diagnosis Code(s):        --- Professional ---                           K29.70, Gastritis, unspecified, without bleeding                           R10.13, Epigastric pain                           R63.4, Abnormal weight loss CPT copyright 2019 American Medical Association. All rights reserved. The codes documented in this report are preliminary and upon coder review may  be revised to meet current compliance requirements.  Jerene Bears, MD 09/07/2021 8:29:41 AM This report has been signed electronically. Number of Addenda: 0

## 2021-09-07 NOTE — Discharge Summary (Addendum)
PATIENT DETAILS Name: Paul Rivers Age: 41 y.o. Sex: male Date of Birth: Dec 24, 1980 MRN: [id number]. Admitting Physician: Norval Morton, MD GOT:[id number], Malka So, MD  Admit Date: 09/04/2021 Discharge date: 09/07/2021  Recommendations for Outpatient Follow-up:  Follow up with PCP in 1-2 weeks Please obtain CMP/CBC in one week Follow renin/aldosterone ratio-pending at the time of discharge H. pylori/EGD gastric biopsy pending-please follow Continue to optimize antihypertensive control.   Admitted From:  Home  Disposition: [place]: No  Equipment/Devices: None  Discharge Condition: Stable  CODE STATUS: FULL CODE  Diet recommendation:  Diet Order             Diet heart healthy/carb modified Room service appropriate? Yes; Fluid consistency: Thin  Diet effective now           Diet - low sodium heart healthy           Diet Carb Modified                    Brief Summary: Patient is a 41 y.o. male with history of HTN, HLD, DM-2, obesity who presented to the ED with epigastric pain/decreased appetite/weight loss for the past several weeks-he stopped taking his antihypertensive medications due to epigastric pain-he was subsequently found to have hypertensive emergency, and AKI.  He was subsequently admitted to the hospitalist service.   Brief Hospital Course: * Hypertensive emergency- (present on admission) Due to noncompliance in the setting of abdominal pain-he also has whitecoat hypertension and resistant hypertension.  Blood pressure stabilized-after initiation of Coreg/hydralazine/amlodipine and addition of Aldactone.  He is now able to tolerate all of his antihypertensive medications as his abdominal pain has improved-CT below regarding abdominal pain.  Please note-patient had a work-up in 2020 for resistant causes-work-up was negative.  Aldosterone/renin panel was ordered by nephrology-and is pending-please ensure that this gets followed  up.    Acute kidney injury superimposed on chronic kidney disease ([place])- (present on admission) Initially there was suspicion that patient had AKI-but it appears that worsening renal function is just progression of his underlying CKD.  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  Renal ultrasound without hydronephrosis, hepatitis B and hepatitis serology was negative.  HIV/ANA were negative.  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  Nephrology will continue to follow closely in the outpatient setting    Lab Results  Component Value Date   CREATININE 3.09 (H) 09/05/2021    Epigastric pain- (present on admission) Had severe epigastric pain and weight loss since mid December-he initially thought that this was likely due to his blood pressure medications and stopped taking his antihypertensives.  Once his blood pressure and renal function stabilized-GI was consulted-he underwent EGD on 1/14 which showed a gastric ulcer.  Recommendations from GI service are to continue PPI on discharge-GI will arrange for further follow-up/work-up to be done in the outpatient setting.  H. pylori/gastric ulcer biopsy results are pending and will need to be followed.  Thankfully after initiation of PPI-abdominal pain has improved-he is able to tolerate oral intake including his antihypertensive medications.    Type 2 diabetes mellitus with other specified complication ([place])- (present on admission)  Not on any oral diabetic medication/insulin therapy since 2018 (when he was diagnosed with DM).  Per patient-he has been able to manage his diabetes  with diet/lifestyle modification.  Continue to follow closely in the outpatient setting with PCP.  Lab Results  Component Value Date   HGBA1C 6.3 (H)  09/05/2021    Recent Labs    09/05/21 0755  GLUCAP 137*    Leukopenia- (present on admission) Resolved-if reoccurs-needs outpatient follow-up with hematology for further evaluation.  Hypokalemia- (present on admission) Likely due to GI losses/poor oral intake-has been started on Aldactone.  Due to resistant hypertension-nephrology has ordered aldosterone/renin ratio-which is currently pending.   Prolonged QT interval- (present on admission) Likely due to hypokalemia-resolved after correction of hypokalemia.  .  Acute gastric ulcer without hemorrhage or perforation- (present on admission) Will be on PPI-see above.  Obesity: Estimated body mass index is 35.66 kg/m as calculated from the following:   Height as of this encounter: 5' 3" (1.6 m).   Weight as of this encounter: 91.3 kg.    Procedures/ 1/14>> EGD  Discharge Diagnoses:  Principal Problem:   Hypertensive emergency Active Problems:   Acute kidney injury superimposed on chronic kidney disease ([place])   Epigastric pain   Type 2 diabetes mellitus with other specified complication (H[facility])   Leukopenia   Hypokalemia   Prolonged QT interval   Obesity (BMI 30-39.9)   Acute gastric ulcer without hemorrhage or perforation   Discharge Instructions:  Activity:  As tolerated    Discharge Instructions     Diet - low sodium heart healthy   Complete by: As directed    Diet Carb Modified   Complete by: As directed    Discharge instructions   Complete by: As directed    Follow with Primary MD  Martinique, Betty G, MD in 1-2 weeks  Please get a complete blood count and chemistry panel checked by your Primary MD at your next visit, and again as instructed by your Primary MD.  Get Medicines reviewed and adjusted: Please take all your medications with you for your next visit with your Primary MD  Laboratory/radiological data: Please request your Primary MD to go over all hospital tests and procedure/radiological results  at the follow up, please ask your Primary MD to get all Hospital records sent to his/her office.  In some cases, they will be blood work, cultures and biopsy results pending at the time of your discharge. Please request that your primary care M.D. follows up on these results.  Also Note the following: If you experience worsening of your admission symptoms, develop shortness of breath, life threatening emergency, suicidal or homicidal thoughts you must seek medical attention immediately by calling 911 or calling your MD immediately  if symptoms less severe.  You must read complete instructions/literature along with all the possible adverse reactions/side effects for all the Medicines you take and that have been prescribed to you. Take any new Medicines after you have completely understood and accpet all the possible adverse reactions/side effects.   Do not drive when taking Pain medications or sleeping medications (Benzodaizepines)  Do not take more than prescribed Pain, Sleep and Anxiety Medications. It is not advisable to combine anxiety,sleep and pain medications without talking with your primary care practitioner  Special Instructions: If you have smoked or chewed Tobacco  in the last 2 yrs please stop smoking, stop any regular Alcohol  and or any Recreational drug use.  Wear Seat belts while driving.  Please note: You were cared for by a hospitalist during your hospital stay. Once you are discharged, your primary care physician will handle any further medical issues. Please  note that NO REFILLS for any discharge medications will be authorized once you are discharged, as it is imperative that you return to your primary care physician (or establish a relationship with a primary care physician if you do not have one) for your post hospital discharge needs so that they can reassess your need for medications and monitor your lab values.   1.)  Aldosterone/renin ratio pending-please have your  primary care practitioner or your primary nephrologist follow up on these results.  2.)  Endoscopy biopsy/H. pylori results are pending at the time of discharge-please call your gastroenterologist office to obtain these results.  Alternatively-please ask your primary care practitioner to follow-up on these results as well.   Increase activity slowly   Complete by: As directed       Allergies as of 09/07/2021   No Known Allergies      Medication List     TAKE these medications    Accu-Chek Aviva Connect w/Device Kit 1 Device by Does not apply route daily.   amLODipine 10 MG tablet Commonly known as: NORVASC Take 1 tablet (10 mg total) by mouth daily.   carvedilol 25 MG tablet Commonly known as: COREG Take 1 tablet (25 mg total) by mouth 2 (two) times daily with a meal.   hydrALAZINE 50 MG tablet Commonly known as: APRESOLINE Take 2 tablets (100 mg total) by mouth 3 (three) times daily. What changed: how much to take   pantoprazole 40 MG tablet Commonly known as: PROTONIX Take 1 tablet (40 mg total) by mouth 2 (two) times daily before a meal. Twice daily before first and last meal of the day x8 weeks, and then switch to daily dosing.   spironolactone 25 MG tablet Commonly known as: ALDACTONE Take 1 tablet (25 mg total) by mouth daily. Start taking on: September 08, 2021        Follow-up Information     Martinique, Betty G, MD. Schedule an appointment as soon as possible for a visit in 1 week(s).   Specialty: Family Medicine Contact information: [place] [address] [phone]         Geralynn Rile, MD Follow up in 2 day(s).   Specialties: Cardiology, Internal Medicine, Radiology Contact information: [place] [address] [phone]         [place], [place] Follow up.   Why: Office will call with date/time, If you dont hear from them,please give them a call Contact information: [place]  [address] [phone]         Jerene Bears, MD Follow up.   Specialty: Gastroenterology Why: Office will call with date/time, If you dont hear from them,please give them a call Contact information: [address] [address] [phone]                No Known Allergies    Consultations:  GI and nephrology  Other Procedures/Studies: US RENAL  Result Date: 09/05/2021 CLINICAL DATA:  Renal dysfunction EXAM: RENAL / URINARY TRACT ULTRASOUND COMPLETE COMPARISON:  CT done on 09/04/2021 FINDINGS: Right Kidney: Renal measurements: 9.1 x 5.4 x 6 cm = volume: 154.7 mL. There is marked increase in the cortical echogenicity. There is no hydronephrosis. Left Kidney: Renal measurements: 8.8 x 4.9 x 3.9 cm = volume: 87.3 mL. There is marked increase in echogenicity in the renal cortex. There is no hydronephrosis. Bladder: Appears normal for degree of bladder distention. Other: None. IMPRESSION: There is no  hydronephrosis. There is marked increase in cortical echogenicity in both kidneys suggesting medical renal disease. Electronically Signed   By: Elmer Picker M.D.   On: 09/05/2021 16:52   CT Renal Stone Study  Result Date: 09/04/2021 CLINICAL DATA:  Generalized abdominal pain. EXAM: CT ABDOMEN AND PELVIS WITHOUT CONTRAST TECHNIQUE: Multidetector CT imaging of the abdomen and pelvis was performed following the standard protocol without IV contrast. RADIATION DOSE REDUCTION: This exam was performed according to the departmental dose-optimization program which includes automated exposure control, adjustment of the mA and/or kV according to patient size and/or use of iterative reconstruction technique. COMPARISON:  None. FINDINGS: Lower chest: No acute abnormality. Hepatobiliary: Unremarkable noncontrast appearance of the hepatic parenchyma. Gallbladder is unremarkable. No biliary ductal dilation. Pancreas: No pancreatic ductal dilation or evidence of acute inflammation.  Spleen: Normal in size without focal abnormality. Adrenals/Urinary Tract: Bilateral adrenal glands are unremarkable. No hydronephrosis. No renal, ureteral or bladder calculi identified. No solid enhancing renal mass. Urinary bladder is unremarkable for degree of distension. Stomach/Bowel: No enteric contrast was administered. Stomach is mildly distended with ingested material without wall thickening. No pathologic dilation of small or large bowel. The appendix and terminal ileum appear normal. The colon is minimally distended limiting evaluation. No evidence of acute bowel inflammation. Vascular/Lymphatic: No significant vascular findings are present. No pathologically enlarged abdominal or pelvic lymph nodes. Reproductive: Prostate is unremarkable. Other: No abdominal wall hernia or abnormality. No abdominopelvic ascites. Musculoskeletal: Multilevel degenerative changes spine. Mild degenerative changes bilateral hips. No acute osseous abnormality. IMPRESSION: No acute abdominopelvic findings. Specifically, no evidence of obstructive uropathy. Electronically Signed   By: Dahlia Bailiff M.D.   On: 09/04/2021 12:11     TODAY-DAY OF DISCHARGE:  Subjective:   [place] today has no headache,no chest abdominal pain,no new weakness tingling or numbness, feels much better wants to go home today.   Objective:   Blood pressure (!) 182/119, pulse 81, temperature 98 F (36.7 C), temperature source Oral, resp. rate 17, height 5' 3" (1.6 m), weight 91.3 kg, SpO2 96 %.  Intake/Output Summary (Last 24 hours) at 09/07/2021 1128 Last data filed at 09/07/2021 0821 Gross per 24 hour  Intake 100 ml  Output --  Net 100 ml   Filed Weights   09/04/21 2136 09/07/21 0726  Weight: 91.3 kg 91.3 kg    Exam: Awake Alert, Oriented *3, No new F.N deficits, Normal affect [place].AT,PERRAL Supple Neck,No JVD, No cervical lymphadenopathy appriciated.  Symmetrical Chest wall movement, Good air movement bilaterally,  CTAB RRR,No Gallops,Rubs or new Murmurs, No Parasternal Heave +ve B.Sounds, Abd Soft, Non tender, No organomegaly appriciated, No rebound -guarding or rigidity. No Cyanosis, Clubbing or edema, No new Rash or bruise   PERTINENT RADIOLOGIC STUDIES: US RENAL  Result Date: 09/05/2021 CLINICAL DATA:  Renal dysfunction EXAM: RENAL / URINARY TRACT ULTRASOUND COMPLETE COMPARISON:  CT done on 09/04/2021 FINDINGS: Right Kidney: Renal measurements: 9.1 x 5.4 x 6 cm = volume: 154.7 mL. There is marked increase in the cortical echogenicity. There is no hydronephrosis. Left Kidney: Renal measurements: 8.8 x 4.9 x 3.9 cm = volume: 87.3 mL. There is marked increase in echogenicity in the renal cortex. There is no hydronephrosis. Bladder: Appears normal for degree of bladder distention. Other: None. IMPRESSION: There is no hydronephrosis. There is marked increase in cortical echogenicity in both kidneys suggesting medical renal disease. Electronically Signed   By: Elmer Picker M.D.   On: 09/05/2021 16:52     PERTINENT LAB RESULTS: CBC: Recent  Labs    09/05/21 0144  WBC 4.3  HGB 14.6  HCT 42.5  PLT 248   CMET CMP     Component Value Date/Time   NA 136 09/07/2021 0120   K 3.2 (L) 09/07/2021 0120   CL 101 09/07/2021 0120   CO2 25 09/07/2021 0120   GLUCOSE 81 09/07/2021 0120   BUN 33 (H) 09/07/2021 0120   CREATININE 3.49 (H) 09/07/2021 0120   CALCIUM 8.7 (L) 09/07/2021 0120   PROT 7.7 09/04/2021 1009   ALBUMIN 4.1 09/04/2021 1009   AST 28 09/04/2021 1009   ALT 22 09/04/2021 1009   ALKPHOS 67 09/04/2021 1009   BILITOT 0.8 09/04/2021 1009   GFRNONAA 22 (L) 09/07/2021 0120   GFRAA 41 (L) 06/04/2019 0946    GFR Estimated Creatinine Clearance: 28.4 mL/min (A) (by C-G formula based on SCr of 3.49 mg/dL (H)). No results for input(s): LIPASE, AMYLASE in the last 72 hours. No results for input(s): CKTOTAL, CKMB, CKMBINDEX, TROPONINI in the last 72 hours. Invalid input(s): POCBNP No results  for input(s): DDIMER in the last 72 hours. Recent Labs    09/05/21 0144  HGBA1C 6.3*   No results for input(s): CHOL, HDL, LDLCALC, TRIG, CHOLHDL, LDLDIRECT in the last 72 hours. No results for input(s): TSH, T4TOTAL, T3FREE, THYROIDAB in the last 72 hours.  Invalid input(s): FREET3 No results for input(s): VITAMINB12, FOLATE, FERRITIN, TIBC, IRON, RETICCTPCT in the last 72 hours. Coags: No results for input(s): INR in the last 72 hours.  Invalid input(s): PT Microbiology: Recent Results (from the past 240 hour(s))  Resp Panel by RT-PCR (Flu A&B, Covid) Nasopharyngeal Swab     Status: None   Collection Time: 09/04/21  2:15 PM   Specimen: Nasopharyngeal Swab; Nasopharyngeal(NP) swabs in vial transport medium  Result Value Ref Range Status   SARS Coronavirus 2 by RT PCR NEGATIVE NEGATIVE Final    Comment: (NOTE) SARS-CoV-2 target nucleic acids are NOT DETECTED.  The SARS-CoV-2 RNA is generally detectable in upper respiratory specimens during the acute phase of infection. The lowest concentration of SARS-CoV-2 viral copies this assay can detect is 138 copies/mL. A negative result does not preclude SARS-Cov-2 infection and should not be used as the sole basis for treatment or other patient management decisions. A negative result may occur with  improper specimen collection/handling, submission of specimen other than nasopharyngeal swab, presence of viral mutation(s) within the areas targeted by this assay, and inadequate number of viral copies(<138 copies/mL). A negative result must be combined with clinical observations, patient history, and epidemiological information. The expected result is Negative.  Fact Sheet for Patients:  EntrepreneurPulse.com.au  Fact Sheet for Healthcare Providers:  IncredibleEmployment.be  This test is no t yet approved or cleared by the [place] FDA and  has been authorized for detection and/or diagnosis of  SARS-CoV-2 by FDA under an Emergency Use Authorization (EUA). This EUA will remain  in effect (meaning this test can be used) for the duration of the COVID-19 declaration under Section 564(b)(1) of the Act, 21 U.S.C.section 360bbb-3(b)(1), unless the authorization is terminated  or revoked sooner.       Influenza A by PCR NEGATIVE NEGATIVE Final   Influenza B by PCR NEGATIVE NEGATIVE Final    Comment: (NOTE) The Xpert Xpress SARS-CoV-2/FLU/RSV plus assay is intended as an aid in the diagnosis of influenza from Nasopharyngeal swab specimens and should not be used as a sole basis for treatment. Nasal washings and aspirates are unacceptable for Xpert Xpress SARS-CoV-2/FLU/RSV  testing.  Fact Sheet for Patients: EntrepreneurPulse.com.au  Fact Sheet for Healthcare Providers: IncredibleEmployment.be  This test is not yet approved or cleared by the [place] FDA and has been authorized for detection and/or diagnosis of SARS-CoV-2 by FDA under an Emergency Use Authorization (EUA). This EUA will remain in effect (meaning this test can be used) for the duration of the COVID-19 declaration under Section 564(b)(1) of the Act, 21 U.S.C. section 360bbb-3(b)(1), unless the authorization is terminated or revoked.  Performed at [facility], [place] [address] [place], [place] [address]     FURTHER DISCHARGE INSTRUCTIONS:  Get Medicines reviewed and adjusted: Please take all your medications with you for your next visit with your Primary MD  Laboratory/radiological data: Please request your Primary MD to go over all hospital tests and procedure/radiological results at the follow up, please ask your Primary MD to get all Hospital records sent to his/her office.  In some cases, they will be blood work, cultures and biopsy results pending at the time of your discharge. Please request that your primary care M.D. goes through all the records of your  hospital data and follows up on these results.  Also Note the following: If you experience worsening of your admission symptoms, develop shortness of breath, life threatening emergency, suicidal or homicidal thoughts you must seek medical attention immediately by calling 911 or calling your MD immediately  if symptoms less severe.  You must read complete instructions/literature along with all the possible adverse reactions/side effects for all the Medicines you take and that have been prescribed to you. Take any new Medicines after you have completely understood and accpet all the possible adverse reactions/side effects.   Do not drive when taking Pain medications or sleeping medications (Benzodaizepines)  Do not take more than prescribed Pain, Sleep and Anxiety Medications. It is not advisable to combine anxiety,sleep and pain medications without talking with your primary care practitioner  Special Instructions: If you have smoked or chewed Tobacco  in the last 2 yrs please stop smoking, stop any regular Alcohol  and or any Recreational drug use.  Wear Seat belts while driving.  Please note: You were cared for by a hospitalist during your hospital stay. Once you are discharged, your primary care physician will handle any further medical issues. Please note that NO REFILLS for any discharge medications will be authorized once you are discharged, as it is imperative that you return to your primary care physician (or establish a relationship with a primary care physician if you do not have one) for your post hospital discharge needs so that they can reassess your need for medications and monitor your lab values.  Total Time spent coordinating discharge including counseling, education and face to face time equals 35 minutes.  SignedOren Binet 09/07/2021 11:28 AM

## 2021-09-07 NOTE — Anesthesia Postprocedure Evaluation (Signed)
Anesthesia Post Note  Patient: Paul Rivers  Procedure(s) Performed: ESOPHAGOGASTRODUODENOSCOPY (EGD) WITH PROPOFOL     Patient location during evaluation: PACU Anesthesia Type: MAC Level of consciousness: awake and alert Pain management: pain level controlled Vital Signs Assessment: post-procedure vital signs reviewed and stable Respiratory status: spontaneous breathing, nonlabored ventilation, respiratory function stable and patient connected to nasal cannula oxygen Cardiovascular status: stable and blood pressure returned to baseline Postop Assessment: no apparent nausea or vomiting Anesthetic complications: no   No notable events documented.  Last Vitals:  Vitals:   09/07/21 0900 09/07/21 1200  BP: (!) 182/119 133/82  Pulse: 81 76  Resp: 17 11  Temp: 36.7 C   SpO2: 96% 97%    Last Pain:  Vitals:   09/07/21 0940  TempSrc:   PainSc: 0-No pain                 Tiajuana Amass

## 2021-09-09 ENCOUNTER — Encounter (HOSPITAL_COMMUNITY): Payer: Self-pay | Admitting: Internal Medicine

## 2021-09-09 ENCOUNTER — Telehealth: Payer: Self-pay

## 2021-09-09 NOTE — Telephone Encounter (Signed)
Transition Care Management Follow-up Telephone Call Date of discharge and from where: [place] 09-07-21 Dx: HTN/AKI How have you been since you were released from the hospital? Feeling better  Any questions or concerns? No  Items Reviewed: Did the pt receive and understand the discharge instructions provided? Yes  Medications obtained and verified? Yes  Other? No  Any new allergies since your discharge? No  Dietary orders reviewed? Yes Do you have support at home? Yes   Home Care and Equipment/Supplies: Were home health services ordered? no If so, what is the name of the agency? na  Has the agency set up a time to come to the patient's home? not applicable Were any new equipment or medical supplies ordered?  No What is the name of the medical supply agency? na Were you able to get the supplies/equipment? not applicable Do you have any questions related to the use of the equipment or supplies? No  Functional Questionnaire: (I = Independent and D = Dependent) ADLs: I  Bathing/Dressing- I  Meal Prep- I  Eating- I  Maintaining continence- I  Transferring/Ambulation- I  Managing Meds- D  Follow up appointments reviewed:  PCP Hospital f/u appt confirmed? Yes  Scheduled to see Dr Martinique  on 09-11-21 @ [facility] f/u appt confirmed? No  . Are transportation arrangements needed? No  If their condition worsens, is the pt aware to call PCP or go to the Emergency Dept.? Yes Was the patient provided with contact information for the PCP's office or ED? Yes Was to pt encouraged to call back with questions or concerns? Yes

## 2021-09-11 ENCOUNTER — Encounter: Payer: Self-pay | Admitting: Family Medicine

## 2021-09-11 ENCOUNTER — Ambulatory Visit (INDEPENDENT_AMBULATORY_CARE_PROVIDER_SITE_OTHER): Payer: Commercial Managed Care - PPO | Admitting: Family Medicine

## 2021-09-11 VITALS — BP 160/90 | HR 96 | Resp 16 | Ht 63.0 in | Wt 203.2 lb

## 2021-09-11 DIAGNOSIS — I10 Essential (primary) hypertension: Secondary | ICD-10-CM

## 2021-09-11 DIAGNOSIS — E876 Hypokalemia: Secondary | ICD-10-CM

## 2021-09-11 DIAGNOSIS — E559 Vitamin D deficiency, unspecified: Secondary | ICD-10-CM | POA: Diagnosis not present

## 2021-09-11 DIAGNOSIS — K279 Peptic ulcer, site unspecified, unspecified as acute or chronic, without hemorrhage or perforation: Secondary | ICD-10-CM

## 2021-09-11 DIAGNOSIS — R361 Hematospermia: Secondary | ICD-10-CM | POA: Diagnosis not present

## 2021-09-11 DIAGNOSIS — K253 Acute gastric ulcer without hemorrhage or perforation: Secondary | ICD-10-CM

## 2021-09-11 DIAGNOSIS — N1831 Chronic kidney disease, stage 3a: Secondary | ICD-10-CM

## 2021-09-11 LAB — CBC
HCT: 43.5 % (ref 39.0–52.0)
Hemoglobin: 14.4 g/dL (ref 13.0–17.0)
MCHC: 33.2 g/dL (ref 30.0–36.0)
MCV: 83 fl (ref 78.0–100.0)
Platelets: 272 10*3/uL (ref 150.0–400.0)
RBC: 5.24 Mil/uL (ref 4.22–5.81)
RDW: 14.4 % (ref 11.5–15.5)
WBC: 3.3 10*3/uL — ABNORMAL LOW (ref 4.0–10.5)

## 2021-09-11 LAB — COMPREHENSIVE METABOLIC PANEL
ALT: 34 U/L (ref 0–53)
AST: 26 U/L (ref 0–37)
Albumin: 4.2 g/dL (ref 3.5–5.2)
Alkaline Phosphatase: 59 U/L (ref 39–117)
BUN: 32 mg/dL — ABNORMAL HIGH (ref 6–23)
CO2: 26 mEq/L (ref 19–32)
Calcium: 9.1 mg/dL (ref 8.4–10.5)
Chloride: 99 mEq/L (ref 96–112)
Creatinine, Ser: 3.37 mg/dL — ABNORMAL HIGH (ref 0.40–1.50)
GFR: 22.09 mL/min — ABNORMAL LOW (ref >60.00)
Glucose, Bld: 116 mg/dL — ABNORMAL HIGH (ref 70–99)
Potassium: 3.3 mEq/L — ABNORMAL LOW (ref 3.5–5.1)
Sodium: 136 mEq/L (ref 135–145)
Total Bilirubin: 0.3 mg/dL (ref 0.2–1.2)
Total Protein: 7.5 g/dL (ref 6.0–8.3)

## 2021-09-11 LAB — VITAMIN D 25 HYDROXY (VIT D DEFICIENCY, FRACTURES): VITD: 14.29 ng/mL — ABNORMAL LOW (ref 30.00–100.00)

## 2021-09-11 LAB — SURGICAL PATHOLOGY

## 2021-09-11 MED ORDER — POTASSIUM CHLORIDE CRYS ER 20 MEQ PO TBCR: 20.0000 meq | EXTENDED_RELEASE_TABLET | Freq: Every day | ORAL | 0 refills | Status: DC

## 2021-09-11 NOTE — Progress Notes (Signed)
HPI: [name] is a 41 y.o. male, who is here today with his wife to follow on recent ED visit. Admitted on 09/04/21 and discharged on 09/07/21. TCM call on 09/09/21.  He was sent to the ER from here because elevated BP and abdominal pain, he had stopped antihypertensive medications. Hypertensive emergency and AKI. Carvedilol,Hydralazine,and amlodipine resumed, Spironolactone added. Aldosterone/renin panel was pending at the time of discharge.  Hypokalemia and prolonged QT interval presents on admission, the latter one resolved after correction of hypoK+. Denies severe/frequent headache, visual changes, chest pain, dyspnea, palpitation, claudication, focal weakness, or edema.  AKI on CKD III, most likely resulted from poorly controlled HTN and DM II. Renal ultrasound without hydronephrosis, hepatitis B and hepatitis serology was negative.  HIV/ANA were negative. + Proteinuria. Nephrology consultation during hospitalization. His wife states that she called nephrologist's office and he will be contacted with appt information.  Lab Results  Component Value Date   CREATININE 3.49 (H) 09/07/2021   BUN 33 (H) 09/07/2021   NA 136 09/07/2021   K 3.2 (L) 09/07/2021   CL 101 09/07/2021   CO2 25 09/07/2021   Lab Results  Component Value Date   WBC 4.3 09/05/2021   HGB 14.6 09/05/2021   HCT 42.5 09/05/2021   MCV 81.1 09/05/2021   PLT 248 09/05/2021   Epigastric abdominal pain:GI was consulted during hospitalization. He underwent EGD on 09/07/21 which showed a gastric ulcer. Started on Pantoprazole 40 mg daily. He is still having intermittent epigastric abdominal pain, moderate. He likes spicy food,curry. Negative for nausea, vomiting, changes in bowel habits, blood in stool or melena. Gastric Bx result is pending.  Last visit , he was concerned about blood in sperm and his wife was reporting blood clots. Negative for urethral discharge or dysuria.  Review of Systems   Constitutional:  Negative for activity change, appetite change, fatigue and fever.  HENT:  Negative for nosebleeds, sore throat and trouble swallowing.   Respiratory:  Negative for cough and wheezing.   Endocrine: Negative for cold intolerance, heat intolerance, polydipsia, polyphagia and polyuria.  Genitourinary:  Negative for decreased urine volume, dysuria and hematuria.  Musculoskeletal:  Negative for gait problem and myalgias.  Neurological:  Negative for syncope, facial asymmetry and weakness.  Rest see pertinent positives and negatives per HPI.  Current Outpatient Medications on File Prior to Visit  Medication Sig Dispense Refill   amLODipine (NORVASC) 10 MG tablet Take 1 tablet (10 mg total) by mouth daily. 90 tablet 3   Blood Glucose Monitoring Suppl (ACCU-CHEK AVIVA CONNECT) w/Device KIT 1 Device by Does not apply route daily. 1 kit 0   carvedilol (COREG) 25 MG tablet Take 1 tablet (25 mg total) by mouth 2 (two) times daily with a meal. 60 tablet 3   hydrALAZINE (APRESOLINE) 50 MG tablet Take 2 tablets (100 mg total) by mouth 3 (three) times daily. 90 tablet 3   pantoprazole (PROTONIX) 40 MG tablet Take 1 tablet (40 mg total) by mouth 2 (two) times daily before a meal. Twice daily before first and last meal of the day x8 weeks, and then switch to daily dosing. 60 tablet 3   spironolactone (ALDACTONE) 25 MG tablet Take 1 tablet (25 mg total) by mouth daily. 30 tablet 2   No current facility-administered medications on file prior to visit.    Past Medical History:  Diagnosis Date   Diabetes mellitus without complication (HCC)    Hyperlipidemia    Hypertension    Prolonged  QT interval 09/05/2021   No Known Allergies  Social History   Socioeconomic History   Marital status: Married    Spouse name: Not on file   Number of children: 3   Years of education: 12   Highest education level: Not on file  Occupational History   Occupation: Security  Tobacco Use   Smoking  status: Never   Smokeless tobacco: Never  Vaping Use   Vaping Use: Never used  Substance and Sexual Activity   Alcohol use: Yes    Comment: Rarely   Drug use: No   Sexual activity: Not on file  Other Topics Concern   Not on file  Social History Narrative   Fun/hobby: Keeping up with children (6, 4, 14)   Social Determinants of Health   Financial Resource Strain: Not on file  Food Insecurity: Not on file  Transportation Needs: Not on file  Physical Activity: Not on file  Stress: Not on file  Social Connections: Not on file   Vitals:   09/11/21 0758  BP: (!) 160/90  Pulse: 96  Resp: 16  SpO2: 98%   Body mass index is 36 kg/m.  Physical Exam Vitals and nursing note reviewed.  Constitutional:      General: He is not in acute distress.    Appearance: He is well-developed.  HENT:     Head: Normocephalic and atraumatic.  Eyes:     Conjunctiva/sclera: Conjunctivae normal.  Cardiovascular:     Rate and Rhythm: Normal rate and regular rhythm.     Pulses:          Dorsalis pedis pulses are 2+ on the right side and 2+ on the left side.     Heart sounds: No murmur heard. Pulmonary:     Effort: Pulmonary effort is normal. No respiratory distress.     Breath sounds: Normal breath sounds.  Abdominal:     Palpations: Abdomen is soft. There is no hepatomegaly or mass.     Tenderness: There is no abdominal tenderness.  Lymphadenopathy:     Cervical: No cervical adenopathy.  Skin:    General: Skin is warm.     Findings: No erythema or rash.  Neurological:     Mental Status: He is alert and oriented to person, place, and time.     Cranial Nerves: No cranial nerve deficit.     Gait: Gait normal.  Psychiatric:     Comments: Well groomed, good eye contact.   ASSESSMENT AND PLAN:  [name] was seen today for hospitalization follow-up.  Diagnoses and all orders for this visit: Orders Placed This Encounter  Procedures   CBC   Comprehensive metabolic panel   VITAMIN D  25 Hydroxy (Vit-D Deficiency, Fractures)   Ambulatory referral to Urology   Lab Results  Component Value Date   WBC 3.3 (L) 09/11/2021   HGB 14.4 09/11/2021   HCT 43.5 09/11/2021   MCV 83.0 09/11/2021   PLT 272.0 09/11/2021   Lab Results  Component Value Date   CREATININE 3.37 (H) 09/11/2021   BUN 32 (H) 09/11/2021   NA 136 09/11/2021   K 3.3 (L) 09/11/2021   CL 99 09/11/2021   CO2 26 09/11/2021   Lab Results  Component Value Date   ALT 34 09/11/2021   AST 26 09/11/2021   ALKPHOS 59 09/11/2021   BILITOT 0.3 09/11/2021   Vitamin D deficiency, unspecified He is not on vit D supplementation. Further recommendations according to 25 OH vit D result.  Resistant  hypertension BP is not yet at goal but it has improved. For now no changes in Carvedilol,Hydralazine,spironolactoe,or amlodipine dose. We discussed possible complications of poorly controlled HTN. He has an appt with cardiologist next month. Pending aldosterone/renin panel. Continue monitoring BP at home and low salt diet. Clearly instructed about warning signs.  CKD (chronic kidney disease), stage III Cr 3.6 and e GFR 23 at the time of his discharge, last 09/2020 Cr was 1.9 and e GFR 44. Pending appt with nephrologist. Continue low salt diet and adequate hydration. Avoid NSAID's.  Acute gastric ulcer without hemorrhage or perforation Still having epigastric pain. Recommend increasing dose of Pantoprazole from 40 mg daily to bid. GERD precautions discussed. Bx report back after visit. Antrum ulcer:Reactive gastropathy with focal surface ulceration and acute inflammation. Stomach body Bx focal active gastritis. Negative for H. Pylori.  Hypokalemia Spironolactone 25 mg added during hospitalization. Pending Aldosterone/renin.  Hematospermia Chronic, most likely benign. Urology referral placed.  I spent a total of 45 minutes in both face to face and non face to face activities for this visit on the date of this  encounter. During this time history was obtained and documented, examination was performed, prior labs/imaging reviewed, and assessment/plan discussed.  Return in about 7 weeks (around 10/30/2021).  Ulysses Alper G. Martinique, MD  [facility]. [place] office.

## 2021-09-11 NOTE — Assessment & Plan Note (Addendum)
Still having epigastric pain. Recommend increasing dose of Pantoprazole from 40 mg daily to bid. GERD precautions discussed. Bx report back after visit. Antrum ulcer:Reactive gastropathy with focal surface ulceration and acute inflammation. Stomach body Bx focal active gastritis. Negative for H. Pylori.

## 2021-09-11 NOTE — Patient Instructions (Addendum)
A few things to remember from today's visit:   Resistant hypertension - Plan: CBC  Stage 3a chronic kidney disease ([place]) - Plan: CBC, Comprehensive metabolic panel  Vitamin D deficiency, unspecified - Plan: VITAMIN D 25 Hydroxy (Vit-D Deficiency, Fractures)  Hematospermia - Plan: Ambulatory referral to Urology  If you need refills please call your pharmacy. Do not use My Chart to request refills or for acute issues that need immediate attention.   No changes today. Continue monitoring blood pressure. Keep appt with cardiologist. Pending appt with nephrologist. Continue Pantoprazole, you can increase it to 2 times daily with empty stomach. Bland diet.   Please be sure medication list is accurate. If a new problem present, please set up appointment sooner than planned today.

## 2021-09-11 NOTE — Assessment & Plan Note (Addendum)
BP is not yet at goal but it has improved. For now no changes in Carvedilol,Hydralazine,spironolactoe,or amlodipine dose. We discussed possible complications of poorly controlled HTN. He has an appt with cardiologist next month. Pending aldosterone/renin panel. Continue monitoring BP at home and low salt diet. Clearly instructed about warning signs.

## 2021-09-11 NOTE — Assessment & Plan Note (Signed)
He is not on vit D supplementation. Further recommendations according to 25 OH vit D result.

## 2021-09-11 NOTE — Assessment & Plan Note (Addendum)
Cr 3.6 and e GFR 23 at the time of his discharge, last 09/2020 Cr was 1.9 and e GFR 44. Pending appt with nephrologist. Continue low salt diet and adequate hydration. Avoid NSAID's.

## 2021-09-12 ENCOUNTER — Encounter: Payer: Self-pay | Admitting: Internal Medicine

## 2021-09-12 ENCOUNTER — Telehealth: Payer: Self-pay | Admitting: Family Medicine

## 2021-09-12 NOTE — Telephone Encounter (Signed)
Pharmacy call and stated potassium chloride SA and Spironolactone will rase the potassium  level and need a call back at [phone] before they fill the next RX.

## 2021-09-13 NOTE — Telephone Encounter (Signed)
I spoke with pharmacist, they will fill Rx.

## 2021-09-13 NOTE — Telephone Encounter (Signed)
Please ask pharmacy to fill Rx as prescribed, K+ still low on Spironolactone. He is supposed to have K+ re-checked a week after starting [place]. Thanks, BJ

## 2021-09-17 LAB — ALDOSTERONE + RENIN ACTIVITY W/ RATIO
ALDO / PRA Ratio: 7.7 (ref 0.0–30.0)
Aldosterone: 57.1 ng/dL — ABNORMAL HIGH (ref 0.0–30.0)
PRA LC/MS/MS: 7.422 ng/mL/hr — ABNORMAL HIGH (ref 0.167–5.380)

## 2021-09-19 ENCOUNTER — Other Ambulatory Visit (INDEPENDENT_AMBULATORY_CARE_PROVIDER_SITE_OTHER): Payer: Commercial Managed Care - PPO

## 2021-09-19 ENCOUNTER — Other Ambulatory Visit: Payer: Commercial Managed Care - PPO

## 2021-09-19 DIAGNOSIS — E876 Hypokalemia: Secondary | ICD-10-CM | POA: Diagnosis not present

## 2021-09-20 ENCOUNTER — Other Ambulatory Visit: Payer: Commercial Managed Care - PPO

## 2021-09-20 ENCOUNTER — Ambulatory Visit: Payer: Commercial Managed Care - PPO | Admitting: Cardiovascular Disease

## 2021-09-20 LAB — POTASSIUM: Potassium: 4.2 mEq/L (ref 3.5–5.1)

## 2021-09-20 NOTE — Progress Notes (Deleted)
Cardiology Office Note:   Date:  09/20/2021  NAME:  Paul Rivers    MRN: [id number] DOB:  1981/06/28   PCP:  Martinique, Betty G, MD  Cardiologist:  Evalina Field, MD  Electrophysiologist:  None   Referring MD: Martinique, Betty G, MD   No chief complaint on file. ***  History of Present Illness:   Paul Rivers is a 41 y.o. male with a hx of HTN, DM, CKD III/IV who presents for follow-up.   Problem List HTN -renal artery duplex negative -Renin/aldo 7.7 -plasma metanephrines slightly elevated DM -A1c 6.6 HLD GERD/Gastritis CKD IV  Past Medical History: Past Medical History:  Diagnosis Date   Diabetes mellitus without complication ([place])    Hyperlipidemia    Hypertension    Prolonged QT interval 09/05/2021    Past Surgical History: Past Surgical History:  Procedure Laterality Date   BIOPSY  09/07/2021   Procedure: BIOPSY;  Surgeon: Jerene Bears, MD;  Location: [facility] ENDOSCOPY;  Service: Gastroenterology;;   ESOPHAGOGASTRODUODENOSCOPY (EGD) WITH PROPOFOL N/A 09/07/2021   Procedure: ESOPHAGOGASTRODUODENOSCOPY (EGD) WITH PROPOFOL;  Surgeon: Jerene Bears, MD;  Location: [facility] ENDOSCOPY;  Service: Gastroenterology;  Laterality: N/A;    Current Medications: No outpatient medications have been marked as taking for the 09/20/21 encounter (Appointment) with O'Neal, Cassie Freer, MD.     Allergies:    Patient has no known allergies.   Social History: Social History   Socioeconomic History   Marital status: Married    Spouse name: Not on file   Number of children: 3   Years of education: 12   Highest education level: Not on file  Occupational History   Occupation: Security  Tobacco Use   Smoking status: Never   Smokeless tobacco: Never  Vaping Use   Vaping Use: Never used  Substance and Sexual Activity   Alcohol use: Yes    Comment: Rarely   Drug use: No   Sexual activity: Not on file  Other Topics Concern   Not on file  Social History Narrative   Fun/hobby:  Keeping up with children (6, 4, 14)   Social Determinants of Health   Financial Resource Strain: Not on file  Food Insecurity: Not on file  Transportation Needs: Not on file  Physical Activity: Not on file  Stress: Not on file  Social Connections: Not on file     Family History: The patient's ***family history includes Diabetes in his father and mother; Hypertension in his father, maternal grandmother, and mother.  ROS:   All other ROS reviewed and negative. Pertinent positives noted in the HPI.     EKGs/Labs/Other Studies Reviewed:   The following studies were personally reviewed by me today:  EKG:  EKG is *** ordered today.  The ekg ordered today demonstrates ***, and was personally reviewed by me.   TTE 06/14/2019  1. Left ventricular ejection fraction, by visual estimation, is 55 to  60%. The left ventricle has normal function. There is severely increased  left ventricular hypertrophy.   2. Left ventricular diastolic Doppler parameters are consistent with  pseudonormalization pattern of LV diastolic filling.   3. Elevated mean left atrial pressure.   4. Global right ventricle has normal systolic function.The right  ventricular size is normal.   5. Left atrial size was mildly dilated.   6. Right atrial size was normal.   7. The mitral valve is normal in structure. No evidence of mitral valve  regurgitation.   8. The tricuspid valve  is normal in structure. Tricuspid valve  regurgitation was not visualized by color flow Doppler.   9. The aortic valve is tricuspid Aortic valve regurgitation was not  visualized by color flow Doppler.  10. The pulmonic valve was grossly normal. Pulmonic valve regurgitation is  not visualized by color flow Doppler.  11. The inferior vena cava is normal in size with greater than 50%  respiratory variability, suggesting right atrial pressure of 3 mmHg.   Recent Labs: 09/07/2021: Magnesium 2.3 09/11/2021: ALT 34; BUN 32; Creatinine, Ser 3.37;  Hemoglobin 14.4; Platelets 272.0; Potassium 3.3; Sodium 136   Recent Lipid Panel    Component Value Date/Time   CHOL 180 12/07/2019 0730   TRIG 95.0 12/07/2019 0730   HDL 36.90 (L) 12/07/2019 0730   CHOLHDL 5 12/07/2019 0730   VLDL 19.0 12/07/2019 0730   LDLCALC 125 (H) 12/07/2019 0730    Physical Exam:   VS:  There were no vitals taken for this visit.   Wt Readings from Last 3 Encounters:  09/11/21 203 lb 4 oz (92.2 kg)  09/07/21 201 lb 4.5 oz (91.3 kg)  09/04/21 203 lb 4 oz (92.2 kg)    General: Well nourished, well developed, in no acute distress Head: Atraumatic, normal size  Eyes: PEERLA, EOMI  Neck: Supple, no JVD Endocrine: No thryomegaly Cardiac: Normal S1, S2; RRR; no murmurs, rubs, or gallops Lungs: Clear to auscultation bilaterally, no wheezing, rhonchi or rales  Abd: Soft, nontender, no hepatomegaly  Ext: No edema, pulses 2+ Musculoskeletal: No deformities, BUE and BLE strength normal and equal Skin: Warm and dry, no rashes   Neuro: Alert and oriented to person, place, time, and situation, CNII-XII grossly intact, no focal deficits  Psych: Normal mood and affect   ASSESSMENT:   Paul Rivers is a 41 y.o. male who presents for the following: No diagnosis found.  PLAN:   There are no diagnoses linked to this encounter.  {Are you ordering a CV Procedure (e.g. stress test, cath, DCCV, TEE, etc)?   Press F2        :[id number]}  Disposition: No follow-ups on file.  Medication Adjustments/Labs and Tests Ordered: Current medicines are reviewed at length with the patient today.  Concerns regarding medicines are outlined above.  No orders of the defined types were placed in this encounter.  No orders of the defined types were placed in this encounter.   There are no Patient Instructions on file for this visit.   Time Spent with Patient: I have spent a total of *** minutes with patient reviewing hospital notes, telemetry, EKGs, labs and examining the patient as  well as establishing an assessment and plan that was discussed with the patient.  > 50% of time was spent in direct patient care.  Signed, Addison Naegeli. Audie Box, MD, [facility]  [address] [place], [place] [address] [phone]  09/20/2021 7:27 AM

## 2021-09-25 NOTE — Progress Notes (Signed)
Cardiology Office Note:   Date:  09/26/2021  NAME:  Paul Rivers    MRN: [id number] DOB:  21-Aug-1981   PCP:  Martinique, Betty G, MD  Cardiologist:  Evalina Field, MD  Electrophysiologist:  None   Referring MD: Martinique, Betty G, MD   Chief Complaint  Patient presents with   Hypertension    History of Present Illness:   Paul Rivers is a 41 y.o. male with a hx of HTN, CKD, DM who presents for follow-up. Has had difficult to control BP. CKD III. Blood pressure remains 160/90.  He is currently on Coreg 25 twice daily, amlodipine 10 mg daily, hydralazine 100 mg 3 times daily, Aldactone 25 mg daily.  Had issues with low potassium in the hospital.  Aldosterone/renin ratio normal on labs from the hospital.  He is screened negative for pheochromocytoma based on lab work from 2020.  CT stone study demonstrated no evidence of adrenal mass.  He is watching his salt intake.  Not exercising that much but going to get back into this.  Denies any chest pain or trouble breathing.  Was recently admitted to the hospital for gastric ulcer.  Now on Protonix and following with GI.  Symptoms of stomach pain have resolved.  BMI 34.  His diabetes numbers seems to be well controlled.  Overall seems to be doing okay.  We discussed possible candidacy for renal denervation therapy.  Given his young age I believe this may be a good option for him.  Discussed his case with Dr. Skeet Latch who is willing to evaluate him for this.  Problem List HTN -negative renal US 05/2019 -Aldo/renin 7.7 08/2021 -negative plasma metanephrines 05/2019 DM -A1c 6.3 CKD III/IV HLD -T chol 180, HDL 36, LDL 125, TG 143   Past Medical History: Past Medical History:  Diagnosis Date   Diabetes mellitus without complication ([place])    Hyperlipidemia    Hypertension    Prolonged QT interval 09/05/2021    Past Surgical History: Past Surgical History:  Procedure Laterality Date   BIOPSY  09/07/2021   Procedure: BIOPSY;   Surgeon: Jerene Bears, MD;  [facility];  Service: Gastroenterology;;   ESOPHAGOGASTRODUODENOSCOPY (EGD) WITH PROPOFOL N/A 09/07/2021   Procedure: ESOPHAGOGASTRODUODENOSCOPY (EGD) WITH PROPOFOL;  Surgeon: Jerene Bears, MD;  Location: [facility] ENDOSCOPY;  Service: Gastroenterology;  Laterality: N/A;    Current Medications: Current Meds  Medication Sig   amLODipine (NORVASC) 10 MG tablet Take 1 tablet (10 mg total) by mouth daily.   Blood Glucose Monitoring Suppl (ACCU-CHEK AVIVA CONNECT) w/Device KIT 1 Device by Does not apply route daily.   carvedilol (COREG) 25 MG tablet Take 1 tablet (25 mg total) by mouth 2 (two) times daily with a meal.   hydrALAZINE (APRESOLINE) 50 MG tablet Take 2 tablets (100 mg total) by mouth 3 (three) times daily.   pantoprazole (PROTONIX) 40 MG tablet Take 1 tablet (40 mg total) by mouth 2 (two) times daily before a meal. Twice daily before first and last meal of the day x8 weeks, and then switch to daily dosing.   potassium chloride SA (KLOR-CON M) 20 MEQ tablet Take 1 tablet (20 mEq total) by mouth daily.   spironolactone (ALDACTONE) 25 MG tablet Take 1 tablet (25 mg total) by mouth daily.     Allergies:    Patient has no known allergies.   Social History: Social History   Socioeconomic History   Marital status: Married    Spouse name: Not  on file   Number of children: 3   Years of education: 12   Highest education level: Not on file  Occupational History   Occupation: Security  Tobacco Use   Smoking status: Never   Smokeless tobacco: Never  Vaping Use   Vaping Use: Never used  Substance and Sexual Activity   Alcohol use: Yes    Comment: Rarely   Drug use: No   Sexual activity: Not on file  Other Topics Concern   Not on file  Social History Narrative   Fun/hobby: Keeping up with children (6, 4, 14)   Social Determinants of Health   Financial Resource Strain: Not on file  Food Insecurity: Not on file  Transportation Needs: Not on  file  Physical Activity: Not on file  Stress: Not on file  Social Connections: Not on file     Family History: The patient's family history includes Diabetes in his father and mother; Hypertension in his father, maternal grandmother, and mother.  ROS:   All other ROS reviewed and negative. Pertinent positives noted in the HPI.     EKGs/Labs/Other Studies Reviewed:   The following studies were personally reviewed by me today:  Renal Vasc US 06/23/2019 Largest Aortic Diameter: 1.8 cm     Renal:     Right: Normal size right kidney. No evidence of right renal artery         stenosis. RRV flow present. Normal right Resisitive Index.         The renal parenchyma is hyperechogenic, with similar         characteristics to surrounding tissue, consistent with         medical renal disease.  Left:  Normal size of left kidney. No evidence of left renal artery         stenosis. LRV flow present. Abnormal left Resisitve Index.         The renal parenchyma is hyperechogenic, with similar         characteristics to surrounding tissue, consistent with         medical renal disease.  Mesenteric:  Normal Celiac artery and Superior Mesenteric artery findings. Areas of  limited  visceral study include right kidney size, left kidney size, right  parenchymal  flow and left parenchymal flow.   TTE 06/14/2019  1. Left ventricular ejection fraction, by visual estimation, is 55 to  60%. The left ventricle has normal function. There is severely increased  left ventricular hypertrophy.   2. Left ventricular diastolic Doppler parameters are consistent with  pseudonormalization pattern of LV diastolic filling.   3. Elevated mean left atrial pressure.   4. Global right ventricle has normal systolic function.The right  ventricular size is normal.   5. Left atrial size was mildly dilated.   6. Right atrial size was normal.   7. The mitral valve is normal in structure. No evidence of mitral valve   regurgitation.   8. The tricuspid valve is normal in structure. Tricuspid valve  regurgitation was not visualized by color flow Doppler.   9. The aortic valve is tricuspid Aortic valve regurgitation was not  visualized by color flow Doppler.  10. The pulmonic valve was grossly normal. Pulmonic valve regurgitation is  not visualized by color flow Doppler.  11. The inferior vena cava is normal in size with greater than 50%  respiratory variability, suggesting right atrial pressure of 3 mmHg.   Recent Labs: 09/07/2021: Magnesium 2.3 09/11/2021: ALT 34; BUN 32; Creatinine, Ser  3.37; Hemoglobin 14.4; Platelets 272.0; Sodium 136 09/19/2021: Potassium 4.2   Recent Lipid Panel    Component Value Date/Time   CHOL 180 12/07/2019 0730   TRIG 95.0 12/07/2019 0730   HDL 36.90 (L) 12/07/2019 0730   CHOLHDL 5 12/07/2019 0730   VLDL 19.0 12/07/2019 0730   LDLCALC 125 (H) 12/07/2019 0730    Physical Exam:   VS:  BP (!) 160/90    Pulse 84    Ht _0  (1.626 m)    Wt 202 lb 12.8 oz (92 kg)    SpO2 99%    BMI 34.81 kg/m    Wt Readings from Last 3 Encounters:  09/26/21 202 lb 12.8 oz (92 kg)  09/11/21 203 lb 4 oz (92.2 kg)  09/07/21 201 lb 4.5 oz (91.3 kg)    General: Well nourished, well developed, in no acute distress Head: Atraumatic, normal size  Eyes: PEERLA, EOMI  Neck: Supple, no JVD Endocrine: No thryomegaly Cardiac: Normal S1, S2; RRR; no murmurs, rubs, or gallops Lungs: Clear to auscultation bilaterally, no wheezing, rhonchi or rales  Abd: Soft, nontender, no hepatomegaly  Ext: No edema, pulses 2+ Musculoskeletal: No deformities, BUE and BLE strength normal and equal Skin: Warm and dry, no rashes   Neuro: Alert and oriented to person, place, time, and situation, CNII-XII grossly intact, no focal deficits  Psych: Normal mood and affect   ASSESSMENT:   PHEONIX CLINKSCALE is a 41 y.o. male who presents for the following: 1. Resistant hypertension     PLAN:   1. Resistant  hypertension -He has had very difficult to control hypertension despite being on 4 medications.  BP still elevated 160/90.  Now has CKD stage III on 4.  His work-up has included normal echocardiogram.  Negative renal artery stenosis studies.  He has also had negative aldosterone/renin ratios.  He screened negative for pheochromocytoma.  Values were marginally elevated and inconsistent with tumor.  He had a CT abdomen pelvis which showed no evidence of adrenal tumors.  Given his young age I think he should be considered for renal denervation therapy.  I discussed his case with Dr. Skeet Latch.  She is willing to evaluate him for this.  For now we will continue current medications.  He will see nephrology next week.  We will have him follow-up with Dr. Oval Linsey moving forward.  I believe she would be best suited to treat him.  Disposition: Return if symptoms worsen or fail to improve.  Medication Adjustments/Labs and Tests Ordered: Current medicines are reviewed at length with the patient today.  Concerns regarding medicines are outlined above.  Orders Placed This Encounter  Procedures   Ambulatory referral to [facility]   No orders of the defined types were placed in this encounter.   Patient Instructions  Medication Instructions:  The current medical regimen is effective;  continue present plan and medications.  *If you need a refill on your cardiac medications before your next appointment, please call your pharmacy*   Follow-Up: At [facility], you and your health needs are our priority.  As part of our continuing mission to provide you with exceptional heart care, we have created designated Provider Care Teams.  These Care Teams include your primary Cardiologist (physician) and Advanced Practice Providers (APPs -  Physician Assistants and Nurse Practitioners) who all work together to provide you with the care you need, when you need it.  We  recommend signing up for the patient  portal called "MyChart".  Sign up information is provided on this After Visit Summary.  MyChart is used to connect with patients for Virtual Visits (Telemedicine).  Patients are able to view lab/test results, encounter notes, upcoming appointments, etc.  Non-urgent messages can be sent to your provider as well.   To learn more about what you can do with MyChart, go to [email or web address].    Your next appointment:   As needed  The format for your next appointment:   In Person  Provider:   Evalina Field, MD     Other Instructions Referred to Dr.[place]'s clinic- they will contact you to set up an appointment.      Time Spent with Patient: I have spent a total of 25 minutes with patient reviewing hospital notes, telemetry, EKGs, labs and examining the patient as well as establishing an assessment and plan that was discussed with the patient.  > 50% of time was spent in direct patient care.  Signed, Addison Naegeli. Audie Box, MD, [place]  [address], [place], [place] [address] [phone]  09/26/2021 10:18 AM

## 2021-09-26 ENCOUNTER — Encounter: Payer: Self-pay | Admitting: Cardiovascular Disease

## 2021-09-26 ENCOUNTER — Ambulatory Visit (INDEPENDENT_AMBULATORY_CARE_PROVIDER_SITE_OTHER): Payer: Commercial Managed Care - PPO | Admitting: Cardiovascular Disease

## 2021-09-26 ENCOUNTER — Other Ambulatory Visit: Payer: Self-pay

## 2021-09-26 VITALS — BP 160/90 | HR 84 | Ht 64.0 in | Wt 202.8 lb

## 2021-09-26 DIAGNOSIS — I10 Essential (primary) hypertension: Secondary | ICD-10-CM | POA: Diagnosis not present

## 2021-09-26 DIAGNOSIS — E1169 Type 2 diabetes mellitus with other specified complication: Secondary | ICD-10-CM

## 2021-09-26 NOTE — Patient Instructions (Signed)
Medication Instructions:  The current medical regimen is effective;  continue present plan and medications.  *If you need a refill on your cardiac medications before your next appointment, please call your pharmacy*   Follow-Up: At [facility], you and your health needs are our priority.  As part of our continuing mission to provide you with exceptional heart care, we have created designated Provider Care Teams.  These Care Teams include your primary Cardiologist (physician) and Advanced Practice Providers (APPs -  Physician Assistants and Nurse Practitioners) who all work together to provide you with the care you need, when you need it.  We recommend signing up for the patient portal called "MyChart".  Sign up information is provided on this After Visit Summary.  MyChart is used to connect with patients for Virtual Visits (Telemedicine).  Patients are able to view lab/test results, encounter notes, upcoming appointments, etc.  Non-urgent messages can be sent to your provider as well.   To learn more about what you can do with MyChart, go to [email or web address].    Your next appointment:   As needed  The format for your next appointment:   In Person  Provider:   Evalina Field, MD     Other Instructions Referred to Dr.[place]'s clinic- they will contact you to set up an appointment.

## 2021-10-08 ENCOUNTER — Encounter: Payer: Commercial Managed Care - PPO | Admitting: Physician Assistant

## 2021-10-10 NOTE — Progress Notes (Signed)
This encounter was created in error - please disregard.

## 2021-10-16 ENCOUNTER — Other Ambulatory Visit: Payer: Self-pay | Admitting: Family Medicine

## 2021-10-17 ENCOUNTER — Other Ambulatory Visit: Payer: Self-pay | Admitting: Family Medicine

## 2021-10-17 DIAGNOSIS — E876 Hypokalemia: Secondary | ICD-10-CM

## 2021-11-16 ENCOUNTER — Other Ambulatory Visit: Payer: Self-pay | Admitting: Family Medicine

## 2021-11-16 DIAGNOSIS — E1169 Type 2 diabetes mellitus with other specified complication: Secondary | ICD-10-CM

## 2021-12-11 ENCOUNTER — Ambulatory Visit (HOSPITAL_BASED_OUTPATIENT_CLINIC_OR_DEPARTMENT_OTHER): Payer: Commercial Managed Care - PPO | Admitting: Cardiovascular Disease

## 2021-12-15 DIAGNOSIS — I10 Essential (primary) hypertension: Secondary | ICD-10-CM | POA: Diagnosis not present

## 2021-12-17 ENCOUNTER — Encounter (HOSPITAL_BASED_OUTPATIENT_CLINIC_OR_DEPARTMENT_OTHER): Payer: Self-pay | Admitting: Cardiovascular Disease

## 2021-12-17 ENCOUNTER — Ambulatory Visit (INDEPENDENT_AMBULATORY_CARE_PROVIDER_SITE_OTHER): Payer: Commercial Managed Care - PPO | Admitting: Cardiovascular Disease

## 2021-12-17 VITALS — BP 184/116 | HR 84 | Ht 64.0 in | Wt 204.8 lb

## 2021-12-17 DIAGNOSIS — F439 Reaction to severe stress, unspecified: Secondary | ICD-10-CM

## 2021-12-17 DIAGNOSIS — I10 Essential (primary) hypertension: Secondary | ICD-10-CM

## 2021-12-17 DIAGNOSIS — F419 Anxiety disorder, unspecified: Secondary | ICD-10-CM

## 2021-12-17 DIAGNOSIS — N184 Chronic kidney disease, stage 4 (severe): Secondary | ICD-10-CM

## 2021-12-17 DIAGNOSIS — Z006 Encounter for examination for normal comparison and control in clinical research program: Secondary | ICD-10-CM

## 2021-12-17 MED ORDER — DOXAZOSIN MESYLATE 2 MG PO TABS: 2.0000 mg | ORAL_TABLET | Freq: Two times a day (BID) | ORAL | 3 refills | Status: DC

## 2021-12-17 NOTE — Assessment & Plan Note (Signed)
Blood pressure remains very poorly controlled.  He has been working hard on diet and exercise.  He has resistant hypertension.  Thus far secondary work-up has been unremarkable.  He does have history of hypokalemia.  However he has no adenomas and renal and and aldosterone levels were unremarkable.  Prior renal artery ultrasound and thyroid function have also been normal.  He has no symptoms of sleep apnea.  He avoids NSAIDs and has very minimal alcohol intake.  For now, we will continue with amlodipine, carvedilol, hydralazine, spironolactone and chlorthalidone.  Add doxazosin 2 mg twice daily.  He is working with nephrology and they prefer to not increasing his hydralazine due to concern for autoimmune disease.  Unfortunately, he is not a candidate for the renal denervation study at this time.  Minimal EGFR is 40.  His EGFR decreased after his recent hospitalization.  He thinks he had some labs drawn at [facility] since February.  We will look into that. ?

## 2021-12-17 NOTE — Research (Signed)
?  Subject Name: Paul Rivers ? ?[name] met inclusion and exclusion criteria for the Virtual Care and Social Determinant Interventions for the management of hypertension trial.  The informed consent form, study requirements and expectations were reviewed with the subject by Dr. Oval Linsey and myself. The subject was given the opportunity to read the consent and ask questions. The subject verbalized understanding of the trial requirements.  All questions were addressed prior to the signing of the consent form. The subject agreed to participate in the trial and signed the informed consent. The informed consent was obtained prior to performance of any protocol-specific procedures for the subject.  A copy of the signed informed consent was given to the subject and a copy was placed in the subject's medical record.  ?[name] was randomized to Group 2.  ? ?

## 2021-12-17 NOTE — Patient Instructions (Addendum)
Medication Instructions:  ?START DOXAZOSIN 2 MG TWICE A DAY  ?  ?Labwork: ?NONE  ?  ?Testing/Procedures: ?NONE ?  ?Follow-Up: ?02/18/2022 8:00 AM WITH PHARM D AT [facility] OFFICE  ? ?04/17/2022 9:15 AM WITH DR [place] AT [facility] OFF[facility]  ? ?Referrals:  ?You have been referred to psychology today. Mental health is an important part of heart health. You have been referred to Dr. Elias Else. He is located at [facility] at [facility]. They will contact you to schedule an appointment. If you have not heard from them in 1 week, you may call [phone] to schedule an appointment.  ? ?AM[facility] OUR CARE GUIDE WILL REACH OUT TO YOU REGARDING STRESS MANAGEMENT  ?  ?Special Instructions:  ? ?MONITOR YOUR BLOOD PRESSURE TWICE A DAY WITH MACHINE PROVIDED. LOG IN BOOK GIVEN UNTIL WE GET YOU LINKED UP WITH [facility]  ? ?DASH Eating Plan ?DASH stands for "Dietary Approaches to Stop Hypertension." The DASH eating plan is a healthy eating plan that has been shown to reduce high blood pressure (hypertension). It may also reduce your risk for type 2 diabetes, heart disease, and stroke. The DASH eating plan may also help with weight loss. ?What are tips for following this plan? ? ?General guidelines ?Avoid eating more than 2,300 mg (milligrams) of salt (sodium) a day. If you have hypertension, you may need to reduce your sodium intake to 1,500 mg a day. ?Limit alcohol intake to no more than 1 drink a day for nonpregnant women and 2 drinks a day for men. One drink equals 12 oz of beer, 5 oz of wine, or 1? oz of hard liquor. ?Work with your health care provider to maintain a healthy body weight or to lose weight. Ask what an ideal weight is for you. ?Get at least 30 minutes of exercise that causes your heart to beat faster (aerobic exercise) most days of the week. Activities may include walking, swimming, or biking. ?Work with your health care provider or diet and nutrition specialist (dietitian) to adjust your  eating plan to your individual calorie needs. ?Reading food labels ? ?Check food labels for the amount of sodium per serving. Choose foods with less than 5 percent of the Daily Value of sodium. Generally, foods with less than 300 mg of sodium per serving fit into this eating plan. ?To find whole grains, look for the word "whole" as the first word in the ingredient list. ?Shopping ?Buy products labeled as "low-sodium" or "no salt added." ?Buy fresh foods. Avoid canned foods and premade or frozen meals. ?Cooking ?Avoid adding salt when cooking. Use salt-free seasonings or herbs instead of table salt or sea salt. Check with your health care provider or pharmacist before using salt substitutes. ?Do not fry foods. Cook foods using healthy methods such as baking, boiling, grilling, and broiling instead. ?Cook with heart-healthy oils, such as olive, canola, soybean, or sunflower oil. ?Meal planning ?Eat a balanced diet that includes: ?5 or more servings of fruits and vegetables each day. At each meal, try to fill half of your plate with fruits and vegetables. ?Up to 6-8 servings of whole grains each day. ?Less than 6 oz of lean meat, poultry, or fish each day. A 3-oz serving of meat is about the same size as a deck of cards. One egg equals 1 oz. ?2 servings of low-fat dairy each day. ?A serving of nuts, seeds, or beans 5 times each week. ?Heart-healthy fats. Healthy fats called Omega-3 fatty acids are found  in foods such as flaxseeds and coldwater fish, like sardines, salmon, and mackerel. ?Limit how much you eat of the following: ?Canned or prepackaged foods. ?Food that is high in trans fat, such as fried foods. ?Food that is high in saturated fat, such as fatty meat. ?Sweets, desserts, sugary drinks, and other foods with added sugar. ?Full-fat dairy products. ?Do not salt foods before eating. ?Try to eat at least 2 vegetarian meals each week. ?Eat more home-cooked food and less restaurant, buffet, and fast food. ?When  eating at a restaurant, ask that your food be prepared with less salt or no salt, if possible. ?What foods are recommended? ?The items listed may not be a complete list. Talk with your dietitian about what dietary choices are best for you. ?Grains ?Whole-grain or whole-wheat bread. Whole-grain or whole-wheat pasta. Brown rice. Modena Morrow. Bulgur. Whole-grain and low-sodium cereals. Pita bread. Low-fat, low-sodium crackers. Whole-wheat flour tortillas. ?Vegetables ?Fresh or frozen vegetables (raw, steamed, roasted, or grilled). Low-sodium or reduced-sodium tomato and vegetable juice. Low-sodium or reduced-sodium tomato sauce and tomato paste. Low-sodium or reduced-sodium canned vegetables. ?Fruits ?All fresh, dried, or frozen fruit. Canned fruit in natural juice (without added sugar). ?Meat and other protein foods ?Skinless chicken or [place]. Ground chicken or [place]. Pork with fat trimmed off. Fish and seafood. Egg whites. Dried beans, peas, or lentils. Unsalted nuts, nut butters, and seeds. Unsalted canned beans. Lean cuts of beef with fat trimmed off. Low-sodium, lean deli meat. ?Dairy ?Low-fat (1%) or fat-free (skim) milk. Fat-free, low-fat, or reduced-fat cheeses. Nonfat, low-sodium ricotta or cottage cheese. Low-fat or nonfat yogurt. Low-fat, low-sodium cheese. ?Fats and oils ?Soft margarine without trans fats. Vegetable oil. Low-fat, reduced-fat, or light mayonnaise and salad dressings (reduced-sodium). Canola, safflower, olive, soybean, and sunflower oils. Avocado. ?Seasoning and other foods ?Herbs. Spices. Seasoning mixes without salt. Unsalted popcorn and pretzels. Fat-free sweets. ?What foods are not recommended? ?The items listed may not be a complete list. Talk with your dietitian about what dietary choices are best for you. ?Grains ?Baked goods made with fat, such as croissants, muffins, or some breads. Dry pasta or rice meal packs. ?Vegetables ?Creamed or fried vegetables. Vegetables in a cheese  sauce. Regular canned vegetables (not low-sodium or reduced-sodium). Regular canned tomato sauce and paste (not low-sodium or reduced-sodium). Regular tomato and vegetable juice (not low-sodium or reduced-sodium). Angie Fava. Olives. ?Fruits ?Canned fruit in a light or heavy syrup. Fried fruit. Fruit in cream or butter sauce. ?Meat and other protein foods ?Fatty cuts of meat. Ribs. Fried meat. Berniece Salines. Sausage. Bologna and other processed lunch meats. Salami. Fatback. Hotdogs. Bratwurst. Salted nuts and seeds. Canned beans with added salt. Canned or smoked fish. Whole eggs or egg yolks. Chicken or [place] with skin. ?Dairy ?Whole or 2% milk, cream, and half-and-half. Whole or full-fat cream cheese. Whole-fat or sweetened yogurt. Full-fat cheese. Nondairy creamers. Whipped toppings. Processed cheese and cheese spreads. ?Fats and oils ?Butter. Stick margarine. Lard. Shortening. Ghee. Bacon fat. Tropical oils, such as coconut, palm kernel, or palm oil. ?Seasoning and other foods ?Salted popcorn and pretzels. Onion salt, garlic salt, seasoned salt, table salt, and sea salt. Worcestershire sauce. Tartar sauce. Barbecue sauce. Teriyaki sauce. Soy sauce, including reduced-sodium. Steak sauce. Canned and packaged gravies. Fish sauce. Oyster sauce. Cocktail sauce. Horseradish that you find on the shelf. Ketchup. Mustard. Meat flavorings and tenderizers. Bouillon cubes. Hot sauce and Tabasco sauce. Premade or packaged marinades. Premade or packaged taco seasonings. Relishes. Regular salad dressings. ?Where to find more information: ?National Heart,  Lung, and Blood Institute: https://wilson-eaton.com/ ?American Heart Association: www.heart.org ?Summary ?The DASH eating plan is a healthy eating plan that has been shown to reduce high blood pressure (hypertension). It may also reduce your risk for type 2 diabetes, heart disease, and stroke. ?With the DASH eating plan, you should limit salt (sodium) intake to 2,300 mg a day. If you have  hypertension, you may need to reduce your sodium intake to 1,500 mg a day. ?When on the DASH eating plan, aim to eat more fresh fruits and vegetables, whole grains, lean proteins, low-fat dairy, and heart-healthy

## 2021-12-17 NOTE — Assessment & Plan Note (Signed)
He follows with nephrology.  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  Continue working blood pressure as above.  They will consider adding an ARB if renal function tolerates. ?

## 2021-12-17 NOTE — Progress Notes (Signed)
? ?Advanced Hypertension Clinic Initial Assessment:   ? ?Date:  12/17/2021  ? ?ID:  Paul Rivers, DOB 02-Jun-1981, MRN [id number] ? ?PCP:  Martinique, Betty G, MD  ?Cardiologist:  Evalina Field, MD  ?Nephrologist: ? ?Referring MD: Geralynn Rile, *  ? ?CC: Hypertension ? ?History of Present Illness:   ? ?Paul Rivers is a 41 y.o. male with a hx of hypertension, diabetes, CKD III, hyperlipidemia, and obesity, here to establish care in the Advanced Hypertension Clinic. He saw Dr. Farris Has 09/2021 and his BP was 160/90 despite being on carvedilol, amlodipine, hydralazine, and spironolactone. Testing for hyperaldosteronism has been negative, as has pheochromocytoma. He had an abdominal CT 08/2021 with no adenomas. Renal dopplers 05/2019 were normal. He was referred to discuss renal denervation. He was seen in the ED 08/2021 with hypertensive emergency and AKI in the setting of stopping all his blood pressure medications. He stopped his medicine due to abdominal pain. Abdominal CT was unremarkable. He was found to have a gastric ulcer. His BP was 151/103 in the hospital on all of his medicines. He saw nephrology 11/2021 and chlorthalidone was changed to 25 mg, and spironolactone was changed to 50 mg daily. At that visit his BP was 143/82. ? ?At home he monitors his blood pressure once a week, averaging around 846 systolic. He was initially diagnosed with hypertension around 2018. It has usually been difficult to control, with little to no improvement from regular exercise or diet changes. For exercise, he is trying to work out as often as his schedule allows. Regarding his diet, he usually cooks his meals at home. He tries to limit the salt in his cooking. He drinks soda, but avoids coffee. Alcohol consumption is very rare. Prior to his hospitalization 08/2021 he had lost 30 lbs due to loss of appetite subsequent to his gastric ulcer. He states that he is gradually recovering from this. However, he is also concerned  about anxiety/stress since being in the hospital. He wonders if any of his current medications may cause anxiety as a side effect.  His wife has said he sometimes snores. Typically his work is strenuous, and he does not get much sleep. He also notes that he cares for his 2 young daughters as well. In his regimen, he confirms taking amlodipine 10 mg, carvedilol 25 mg, and hydralazine 50 mg. He denies any palpitations, chest pain, shortness of breath, or peripheral edema. No lightheadedness, headaches, syncope, orthopnea, or PND. ? ?Previous antihypertensives: ?N/a ? ?Past Medical History:  ?Diagnosis Date  ? CKD (chronic kidney disease) stage 4, GFR 15-29 ml/min (HCC) 06/04/2019  ? Diabetes mellitus without complication ([place])   ? Hyperlipidemia   ? Hypertension   ? Prolonged QT interval 09/05/2021  ? ? ?Past Surgical History:  ?Procedure Laterality Date  ? BIOPSY  09/07/2021  ? Procedure: BIOPSY;  Surgeon: Jerene Bears, MD;  Location: [facility] ENDOSCOPY;  Service: Gastroenterology;;  ? ESOPHAGOGASTRODUODENOSCOPY (EGD) WITH PROPOFOL N/A 09/07/2021  ? Procedure: ESOPHAGOGASTRODUODENOSCOPY (EGD) WITH PROPOFOL;  Surgeon: Jerene Bears, MD;  Location: [facility] ENDOSCOPY;  Service: Gastroenterology;  Laterality: N/A;  ? ? ?Current Medications: ?Current Meds  ?Medication Sig  ? amLODipine (NORVASC) 10 MG tablet Take 1 tablet (10 mg total) by mouth daily.  ? Blood Glucose Monitoring Suppl (ACCU-CHEK AVIVA CONNECT) w/Device KIT 1 Device by Does not apply route daily.  ? carvedilol (COREG) 25 MG tablet Take 1 tablet (25 mg total) by mouth 2 (two) times daily with a meal.  ?  chlorthalidone (HYGROTON) 25 MG tablet Take 25 mg by mouth daily.  ? doxazosin (CARDURA) 2 MG tablet Take 1 tablet (2 mg total) by mouth 2 (two) times daily.  ? hydrALAZINE (APRESOLINE) 50 MG tablet TAKE 1 TABLET BY MOUTH THREE TIMES DAILY  ? pantoprazole (PROTONIX) 40 MG tablet Take 1 tablet (40 mg total) by mouth 2 (two) times daily before a meal. Twice daily before  first and last meal of the day x8 weeks, and then switch to daily dosing.  ? spironolactone (ALDACTONE) 25 MG tablet Take 1 tablet (25 mg total) by mouth daily.  ?  ? ?Allergies:   Patient has no known allergies.  ? ?Social History  ? ?Socioeconomic History  ? Marital status: Married  ?  Spouse name: Not on file  ? Number of children: 3  ? Years of education: 66  ? Highest education level: Not on file  ?Occupational History  ? Occupation: Security  ?Tobacco Use  ? Smoking status: Never  ? Smokeless tobacco: Never  ?Vaping Use  ? Vaping Use: Never used  ?Substance and Sexual Activity  ? Alcohol use: Yes  ?  Comment: Rarely  ? Drug use: No  ? Sexual activity: Not on file  ?Other Topics Concern  ? Not on file  ?Social History Narrative  ? Fun/hobby: Keeping up with children (6, 4, 14)  ? ?Social Determinants of Health  ? ?Financial Resource Strain: Low Risk   ? Difficulty of Paying Living Expenses: Not hard at all  ?Food Insecurity: No Food Insecurity  ? Worried About Charity fundraiser in the Last Year: Never true  ? Ran Out of Food in the Last Year: Never true  ?Transportation Needs: No Transportation Needs  ? Lack of Transportation (Medical): No  ? Lack of Transportation (Non-Medical): No  ?Physical Activity: Inactive  ? Days of Exercise per Week: 0 days  ? Minutes of Exercise per Session: 0 min  ?Stress: Not on file  ?Social Connections: Not on file  ?  ? ?Family History: ?The patient's family history includes Diabetes in his father and mother; Hypertension in his brother, father, maternal grandmother, and mother; Stroke in his father. ? ?ROS:   ?Please see the history of present illness.    ?(+) Anxiety/Stress ?(+) Fatigue ?(+) Insomnia ?(+) Snoring ?All other systems reviewed and are negative. ? ?EKGs/Labs/Other Studies Reviewed:   ? ?Bilateral Renal Artery Dopplers 06/23/2019: ?Summary:  ?Largest Aortic Diameter: 1.8 cm  ?   ?Renal:  ?   ?Right: Normal size right kidney. No evidence of right renal artery  ?        stenosis. RRV flow present. Normal right Resisitive Index.  ?       The renal parenchyma is hyperechogenic, with similar  ?       characteristics to surrounding tissue, consistent with  ?       medical renal disease.  ?Left:  Normal size of left kidney. No evidence of left renal artery  ?       stenosis. LRV flow present. Abnormal left Resisitve Index.  ?       The renal parenchyma is hyperechogenic, with similar  ?       characteristics to surrounding tissue, consistent with  ?       medical renal disease.  ?Mesenteric:  ?Normal Celiac artery and Superior Mesenteric artery findings. Areas of  ?limited  ?visceral study include right kidney size, left kidney size, right  ?parenchymal  ?flow and  left parenchymal flow.  ? ?Echo 06/14/2019: ? 1. Left ventricular ejection fraction, by visual estimation, is 55 to  ?60%. The left ventricle has normal function. There is severely increased  ?left ventricular hypertrophy.  ? 2. Left ventricular diastolic Doppler parameters are consistent with  ?pseudonormalization pattern of LV diastolic filling.  ? 3. Elevated mean left atrial pressure.  ? 4. Global right ventricle has normal systolic function.The right  ?ventricular size is normal.  ? 5. Left atrial size was mildly dilated.  ? 6. Right atrial size was normal.  ? 7. The mitral valve is normal in structure. No evidence of mitral valve  ?regurgitation.  ? 8. The tricuspid valve is normal in structure. Tricuspid valve  ?regurgitation was not visualized by color flow Doppler.  ? 9. The aortic valve is tricuspid Aortic valve regurgitation was not  ?visualized by color flow Doppler.  ?10. The pulmonic valve was grossly normal. Pulmonic valve regurgitation is not visualized by color flow Doppler.  ?11. The inferior vena cava is normal in size with greater than 50%  ?respiratory variability, suggesting right atrial pressure of 3 mmHg.  ? ? ?EKG:  EKG is personally reviewed. ?12/17/2021: EKG was not ordered. ? ?Recent  Labs: ?09/07/2021: Magnesium 2.3 ?09/11/2021: ALT 34; BUN 32; Creatinine, Ser 3.37; Hemoglobin 14.4; Platelets 272.0; Sodium 136 ?09/19/2021: Potassium 4.2  ? ?Recent Lipid Panel ?   ?Component Value Date/Time  ? CHOL 18

## 2021-12-18 ENCOUNTER — Encounter (HOSPITAL_BASED_OUTPATIENT_CLINIC_OR_DEPARTMENT_OTHER): Payer: Self-pay

## 2021-12-19 ENCOUNTER — Telehealth: Payer: Self-pay

## 2021-12-19 DIAGNOSIS — Z006 Encounter for examination for normal comparison and control in clinical research program: Secondary | ICD-10-CM

## 2021-12-19 DIAGNOSIS — I10 Essential (primary) hypertension: Secondary | ICD-10-CM | POA: Diagnosis not present

## 2021-12-19 NOTE — Telephone Encounter (Signed)
I have attempted without success to contact this patient by phone for Virtual care and social determinant interventions for [facility] set up. Pt's voicemail box was full.  An e-mail was sent to patient. I will follow up with pt next week.  ? ? ?

## 2021-12-20 ENCOUNTER — Telehealth: Payer: Self-pay

## 2021-12-20 DIAGNOSIS — Z0000 Encounter for general adult medical examination without abnormal findings: Secondary | ICD-10-CM

## 2021-12-20 NOTE — Telephone Encounter (Signed)
Called patient for 24-hr f/u call to remind patient to check bp twice daily, take bp meds at least 30 mins before checking bp, and to answer survey questions. Asked patient if prompt times worked with his schedule. Patient stated that he would like to adjust his morning prompt time in the mornings during the week to 6:30am. Prompt times have been adjusted as requested in [place] portal. Patient stated that using the app and cuff was very straight forward and he didn't have any additional questions at this time.  ? ?Patient was offered health coaching per his survey response that he does not have an eating plan. Patient is interested an improving his eating behaviors and participating in health coaching. Patient has been scheduled for his initial health coaching session via telephone on 5/3 at 9:15am. Patient was emailed a copy of the [employer] and Code of Ethics for his records.  ? ? ?Avelino Leeds, [place] ?[facility] ?Care Guide, Health Coach ?[address] ?[place] [address] ?Telephone: [phone] ?Email: [email or web address] ? ?

## 2021-12-23 ENCOUNTER — Ambulatory Visit (INDEPENDENT_AMBULATORY_CARE_PROVIDER_SITE_OTHER): Payer: Commercial Managed Care - PPO

## 2021-12-23 DIAGNOSIS — Z0000 Encounter for general adult medical examination without abnormal findings: Secondary | ICD-10-CM

## 2021-12-23 NOTE — Progress Notes (Signed)
Appointment Outcome: Completed, Session #: Initial ?Start time: 9:16am   End time: 10:15am   Total Mins: 59 minutes ? ?AGREEMENTS SECTION ? ? ?Overall Goal(s): ?Improve healthy eating habits ?Stress management ? ?Agreement/Action Steps:  ?Improve healthy eating habits ?Implement DASH Diet ?Continue to eat fresh produce ?Continue to limit the use of spices ?Continue to limit fried foods ?Aim to consume 2,000mg  of sodium per day ?Read food labels ?Use sodium tracker sheets to monitor daily sodium intake  ?Continue to limit the use of salt while cooking ?Read information on meal planning/prepping ? ? ?Stress management ?Writing nightly ?Set alarm to write at 9:30am ?Practice deep breathing ?Talk walks around neighborhood ? ? ? ?Progress Notes:  ?Patient was encouraged to adopt the DASH diet to reduce sodium in his diet during his last visit. Patient stated that he is meat eater but loves vegetables too. Patient stated that he cooks at home and enjoyed spicy food until he started having stomach pain and could not tolerate it along with fried food. Patient stated that he uses to add additional spice to his food prior to having this issue and being diagnosed with gastric ulcers.  ? ?Patient stated that is rarely purchases prepackaged foods. Patient stated that if he purchases can goods, he chooses no sodium added products. However, patient prefers fresh produce. Patient stated that he has been reading food labels for a while but has not tracked how much sodium he actually consumes per day. ? ?Patient expressed that when he is working, there are times when he stops to get him something to eat for breakfast and may stop to get dinner on his way home. Asked patient if he had considered meal planning/prepping to help him control what he is eating and how it is prepared. Patient stated that he drinks 4-5 bottles of water per day. Patient mentioned that he doesn't drink sodas and limits his sugar intake.  ? ?Patient stated that  he has experienced a lot of stress since being diagnosed with stomach ulcers. Patient shared that he experiences stress on his job at times. Patient stated that he tends to suppress how he feel so that he doesn't take his emotions out on others. Patient mentioned that he tries to manage his stress by taking walks around the neighborhood. Patient shared that he also prays every morning and take deep breathes at times.  ? ?Asked patient if he tried writing out his emotions since he tends to suppress how he feel. Patient shared that he uses to write a lot when he was in [employer], and it was a helpful tool. Patient stated that he could try writing again to see if it helps with managing stress but may find it challenging to find a time to write. Suggested to patient to set an alarm prior to the time that he goes to bed as a reminder to write.  ? ? ?Coaching Outcomes:  ?Emailed patient information of meal planning and prepping so that he can reduce the occurrence of eating fast food and controlling the way his food his prepared. ? ?Patient will start working on reducing the amount of spices he uses to season his food while cooking  ? ?Patient decided that he will aim to consume no more than 2,000mg  of sodium per day. Patient will start tracking his sodium consumption to ensure that he is not consuming more than 2,000mg  of sodium per day. Emailed patient a copy of the sodium tracker sheet to help him monitor his  sodium intake.  ? ?Patient decided that he will write nightly to manage stress. Patient will set an alarm around 9:30pm as a reminder. Patient will continue implement walking and deep breathing as strategies to managing stress.  ?

## 2021-12-24 ENCOUNTER — Telehealth: Payer: Self-pay

## 2021-12-24 DIAGNOSIS — Z006 Encounter for examination for normal comparison and control in clinical research program: Secondary | ICD-10-CM

## 2021-12-24 NOTE — Telephone Encounter (Signed)
Called pt to set up his Vivify for D. Maringouin's HTN Trial. Pt set up his Vivify over the weekend. His readings are showing up in [place] successfully.  ?

## 2022-01-01 ENCOUNTER — Telehealth: Payer: Self-pay | Admitting: Pharmacist Clinician (PhC)/ Clinical Pharmacy Specialist

## 2022-01-01 NOTE — Telephone Encounter (Signed)
Spoke with patient.  He states compliance with medications and no side effects.  Only medication possibilities would be adding clonidine or minoxidil.   ? ?He states he went back to work this week, and his job is somewhat stressful and he has less time to be active.  For now will have him switch amlodipine to evenings to better divide his medications.  If readings don't start to trend down will need to consider additional medication.  Chart notes that nephrology would prefer not to increase hydralazine 2/2 concern for autoimmune disease.  Cannot increase any other medications as all are at max dose or unable to increase because of kidney disease.  ?

## 2022-01-01 NOTE — Telephone Encounter (Signed)
-----   Message from Maryelizabeth Rowan, RN sent at 12/31/2021 10:26 AM EDT ----- ?Regarding: Hypertensive Clinic bp trends. ?Hi Dr Oval Linsey and Erasmo Downer,  ? ?Mr Paul Rivers had doxazosin 2mg  BID added on 4/25.  It is 2 weeks since med added. His 14 day average is  sbp148- dbp100- hr79.   Fifteen DBP recorded were greater than 100. Twelve DBP recorded were less than 100. He sees Erasmo Downer next on 6/27. Health coaching 5/15.  Just making you all aware incase you would want to make any additional change to antihypertensives.   ? ?Thank you! - Laretta Alstrom ? ?

## 2022-01-06 ENCOUNTER — Ambulatory Visit (INDEPENDENT_AMBULATORY_CARE_PROVIDER_SITE_OTHER): Payer: Commercial Managed Care - PPO

## 2022-01-06 DIAGNOSIS — Z0000 Encounter for general adult medical examination without abnormal findings: Secondary | ICD-10-CM

## 2022-01-06 NOTE — Progress Notes (Signed)
Appointment Outcome: Completed, Session #: 1 Start time: 12:31pm   End time: 1:05pm   Total Mins: 34 minutes  AGREEMENTS SECTION    Overall Goal(s): Improve healthy eating habits Stress management   Agreement/Action Steps:  Improve healthy eating habits Implement DASH Diet Continue to eat fresh produce Continue to limit the use of spices Continue to limit fried foods Aim to consume 2,031m of sodium per day Read food labels Use sodium tracker sheets to monitor daily sodium intake  Continue to limit the use of salt while cooking Read information on meal planning/prepping     Stress management Writing nightly Set alarm to write at 9:30am Practice deep breathing Talk walks around neighborhood   Progress Notes:  Patient reported that he has been able to continue eating fresh produce, limit the use of spices, limit eating fried foods, and limit salt use while cooking. Patient stated that he has been able to continue these habits because he has been doing most of the cooking. Patient stated that he is at his happiest when he is cooking. Patient stated that he did eat out a couple of times but did not go overboard.   Patient stated that he hasn't gone over consumption of 2,0053mof sodium per day by reading food labels. Patient stated that he did open the email to review the sodium track to monitor his daily sodium intake. Patient mentioned that he also read the information on meal planning/prepping. Patient stated that he tried to implement this step and needs to get used to it. Patient stated that most of the time he goes without eating and eat once or twice a day. Patient stated that he cooks enough for the family for the evening.  Patient stated that he forgot to write because he had so much stuff on his mind and it's hard to keep track of things. Patient stated that it is also hard to be consistent with certain things but forgot to set the alarm as a reminder. Patient stated that he  did take walks on occasions when he wasn't tired after work or depending on the time he got home from work. Patient stated that he has been engaged in various house projects that he needed to complete and was not able to relax.   Patient stated that his stress level is a 5/6. Patient stated that this is a result from moving a lot at work, climbing ladders, and having to think quickly to resolve issues. Patient stated that he practiced deep breathing as usual when he is feeling stressed. Patient stated that he gives himself 20 minutes to sit in his truck to decompress after work. Patient expressed that he has the tendency to suppress how he feel. Discussed with patient that he stated in previous session that writing was a tool that was useful for him before and asked him how he thinks writing can help him with addressing those suppressed emotions. Patient expressed that it would be helpful.   Patient stated that he also tries to talk positive and pray, which helps, but he explained that his concerns about his health is always in the back of his mind. Patient stated that he then questions if things will get better. Asked patient if he has a support person that he can talk to when he is feeling this way. Patient stated that it doesn't matter who he talks to, those thoughts remain. Patient shared that he also has an issue with not knowing how to turn the switch  off in his mind. Asked patient if he was interested in talking to a counselor/therapist and he expressed interest. Informed patient that he will be emailed and mailed information on resources in the community that can assist him in finding support.     Indicators of Success and Accountability:  Patient has been able to continue to improve his healthy eating habits over the past two weeks to limit his sodium consumption to 2,038m per day. Readiness: Patient is in the action phase of improving his healthy eating habits and managing stress management.   Strengths and Supports: Patient is relying on his ability to cook to control how much sodium he consumes.  Challenges and Barriers: Patient is working on dealing with processing/expressing his stress.    Coaching Outcomes: Patient will continue to implement his action steps as outlined above over the next two weeks.   Mailed patient copies of the sodium tracker so that he can physically have them to monitor his sodium intake for one week.   Emailed patient information on various deep breathing exercises that he can practice, along with information on how to conduct self-check-ins and implementing positive self-talk to consider if these are steps that he would like to incorporate.   Emailed and mailed patient a copy of the [employer] and local mental health resources to help assist him with identifying a counselor/therapist to establish care with.    Attempted: Fulfilled - Patient has been able to continue eating fresh produce, limit use of spices, limit fried foods, limit sodium consumption to 2,0064mper day by reading food labels, and limiting salt use while cooking. Patient read information on meal planning/prepping. Patient continues to practice deep breathing. Partial - Patient took walks around his neighborhood on some occasions.  Not met - Patient did not use the sodium tracker sheets to monitor his daily sodium intake. Patient has not written in his journal.

## 2022-01-10 ENCOUNTER — Telehealth (HOSPITAL_BASED_OUTPATIENT_CLINIC_OR_DEPARTMENT_OTHER): Payer: Self-pay | Admitting: *Deleted

## 2022-01-10 MED ORDER — DOXAZOSIN MESYLATE 8 MG PO TABS: 8.0000 mg | ORAL_TABLET | Freq: Two times a day (BID) | ORAL | 2 refills | Status: DC

## 2022-01-10 NOTE — Telephone Encounter (Signed)
Jayceon Troy DOB [birth year] DBP mostly 102-112. average over 2 weeks SBP 149, DBP 101. Adjusted med times on 5/10. Next appt is 6/27. Thanks  Patty RN @ [facility]  Thank you.  Jahmad Petrich, can you please call him and increase doxazosin to 8mg  bid?  As per Dr Oval Linsey pt doxazosin was increase to 8 mg BID, new rx was send into pt [facility] #[id number] 2 refills, pt made aware of medication changes.

## 2022-01-16 ENCOUNTER — Telehealth: Payer: Self-pay | Admitting: Cardiovascular Disease

## 2022-01-16 NOTE — Telephone Encounter (Signed)
BP high in [facility].  Patient stressed by multiple medical diagnoses.  Please start clonidine patch 0.1mg  weekly.  Refer to Dr. Michail Sermon to discuss stress from multiple medical diagnoses.  Jhaden Pizzuto C. Oval Linsey, MD, [facility] 01/16/2022 1:47 PM

## 2022-01-21 ENCOUNTER — Ambulatory Visit (INDEPENDENT_AMBULATORY_CARE_PROVIDER_SITE_OTHER): Payer: Commercial Managed Care - PPO

## 2022-01-21 DIAGNOSIS — Z0000 Encounter for general adult medical examination without abnormal findings: Secondary | ICD-10-CM

## 2022-01-21 NOTE — Progress Notes (Unsigned)
Appointment Outcome: Completed, Session #: 2 Start time: 12:30pm   End time: 1:00pm   Total Mins: 30 minutes  AGREEMENTS SECTION    Overall Goal(s): Improve healthy eating habits Stress management   Agreement/Action Steps:  Improve healthy eating habits Implement DASH Diet Continue to eat fresh produce Continue to limit the use of spices Continue to limit fried foods Aim to consume 2,050m of sodium per day Read food labels Use sodium tracker sheets to monitor daily sodium intake  Continue to limit the use of salt while cooking Read information on meal planning/prepping     Stress management Writing nightly Set alarm to write at 9:30am Practice deep breathing Take walks around neighborhood   Progress Notes:  Patient stated that changing his eating habits is going well. Patient shared that he is eating more fresh vegetables. Patient reported that he doesn't eat a lot of fried foods. Patient mentioned that he may eat [place] sparingly, but it is not a regular basis. Patient stated that he normally only eats dinner because he is accustomed to not eating while working. Patient shared that he may eat breakfast when he is home but not frequently.   Patient stated that monitoring his sodium intake and maintaining not consuming more than 2,0031mper day is not an issue. Patient stated that don't eat added salt on his food or use when cooking. Patient stated that he is using herbs and spices to season his food and he read the food labels on the spices to that if it does contain any salt that it is low. Patient shared that he doesn't eat sandwiches and that helps with lowering his sodium intake. Patient stated that when he purchases canned goods such as diced tomatoes, he gets the one with no salt added. Patient mentioned that he has a pasta that contains no sodium.   Patient mentioned that he and his wife discussed the idea of starting to meal plan/prep for the week and are intending  on trying this process to aid in ensuring they have healthy meals readily available and to continue limiting their sodium intake.  Discussed with patient his responses regarding stress to the [facility] team that monitor [place] stated that his major source of stress is from his job now. Patient mentioned that he practices deep breathing a lot because of being stressed. Patient stated that he has started talking to his wife at night instead of writing in his journal about how he is feeling. Patient mentioned that his health is still on his mind, but he is becoming more aware of how his behaviors are in relation to improving his health.   Patient stated that he is walking in the evenings when he has the time. Patient shared that it helps to clear his mind. Patient reported that he walks between 30-45 minutes each time. Patient shared that last week he was only able to walk 3 times due to the weather, which is normal during the week. Patient mentioned that he walks regardless on the weekends.  Patient mentioned that he still sits in his vehicle for approximately 20 minutes before going inside the house after work. Patient shared that this strategy helps him decompress from work before engaging in his family responsibilities.  Discussed with patient the new changes in medications made by Dr. RaOval Linseyor his hypertension medications based on the trends that her and I discussed last week. Patient acknowledged that he was called regarding one change in  medication but was not aware of the most recent change  Discussed with patient that he has an upcoming appointment scheduled with Dr. Michail Sermon on June 20th at 3pm. Patient confirmed that he was aware of the appointment.    Indicators of Success and Accountability:  Patient stated that being conscious of the foods that he is eating, how much he eats at a given time, and taking the necessary actions to make healthier choices  are his indicators of success and accountability.  Readiness: Patient is in the action phase of improving his healthy eating habits and managing stress.  Strengths and Supports: Patient is being supported by his wife and mother. Patient has become more self-aware of his eating behaviors. Challenges and Barriers: Patient stated that he is going on vacation within the two-week timeframe and is concerned about maintaining his eating habits.   Coaching Outcomes: Patient mentioned that he is concerned about maintaining his new eating habits while on vacation. Discussed with the patient's different strategies to monitor his sodium intake and use of spices during that time such as asking for light seasonings, not adding salt, trying to identify on restaurants' websites or menus the amount of sodium in each meal and what they use to season, and ensuring to continue including vegetables with his meals.     Patient will implement the following action steps over the next two weeks as outlined below.   Agreement/Action Steps:  Improve healthy eating habits Implement DASH Diet Continue to eat fresh produce Continue to limit the use of spices Continue to limit fried foods Aim to consume 2,068m or less of sodium per day Read food labels Continue to limit the use of salt while cooking Start meal planning/prepping     Stress management Use support system Start counseling on June 20th Practice deep breathing Take walks around neighborhood    Attempted: Fulfilled - Patient was able to continue eating fresh produce, limit the use of spices, limit fried foods, maintain consumption of 2,0079mof sodium or less per day by reading food labels and using tracker sheets, along with limiting the use of salt while cooking. Patient has read information on meal planning/prepping. Patient is also practicing deep breathing and taking walks to manage stress.  Not met - Patient did not write in journal over the  past two weeks.

## 2022-01-26 IMAGING — CT CT RENAL STONE PROTOCOL
2 of 4 series · 16 of 46 positions shown, 18 images · non-contrast
Comparison: None.

CLINICAL DATA: Generalized abdominal pain.



[Series 3: ap without · axial · non-contrast · 0.89mm/px · z∈[-268,+167]mm · 13 of 99 slices shown, 15 images]
[im 6/99  soft-tissue]
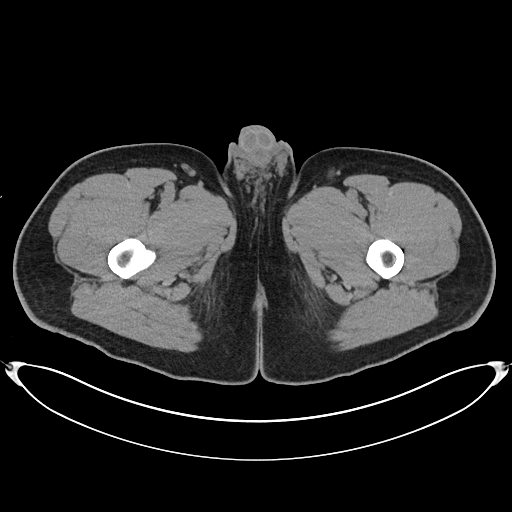
[im 6/99  bone]
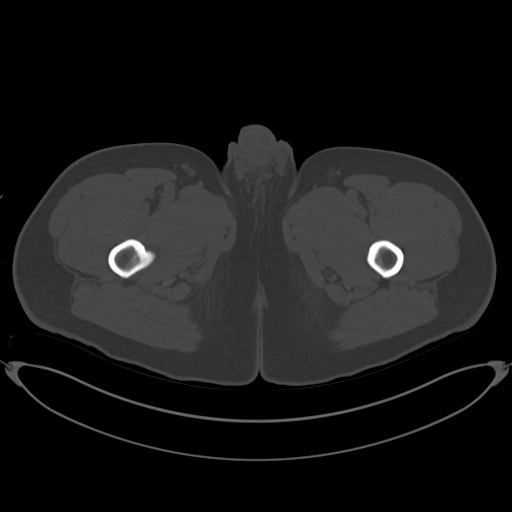
[im 12/99  soft-tissue]
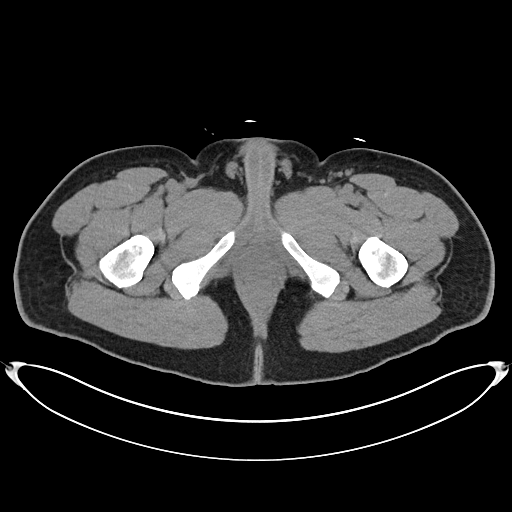
[im 24/99  soft-tissue]
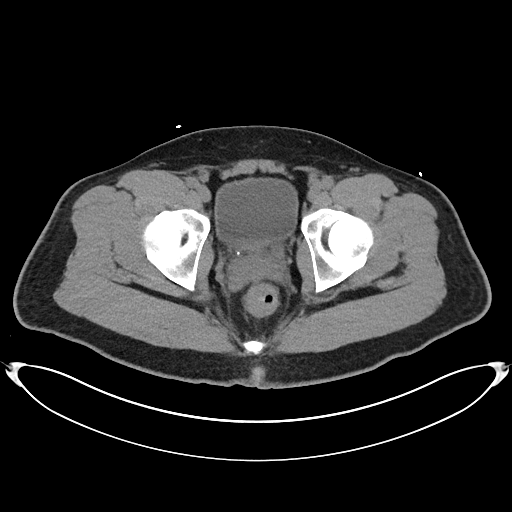
[im 29/99  soft-tissue]
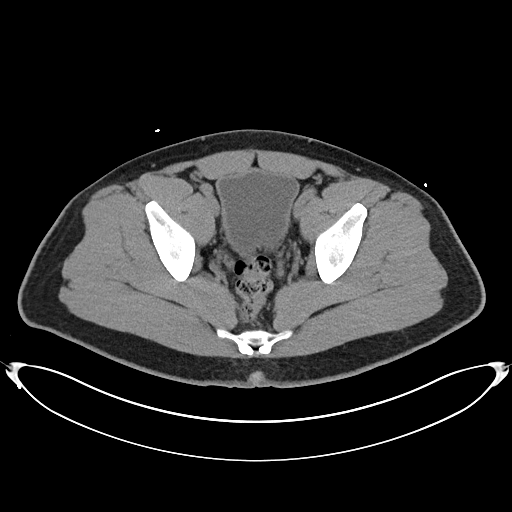
[im 35/99  soft-tissue]
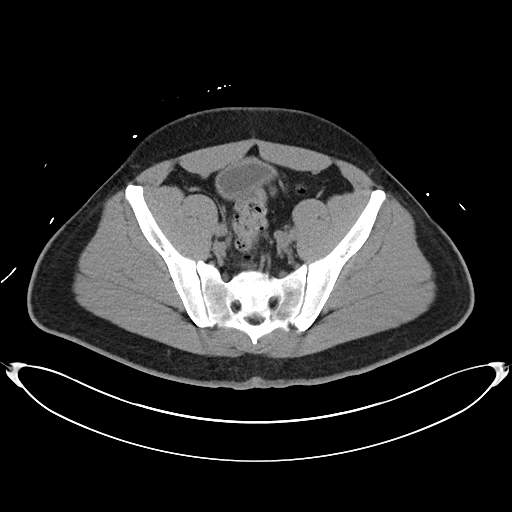
[im 41/99  soft-tissue]
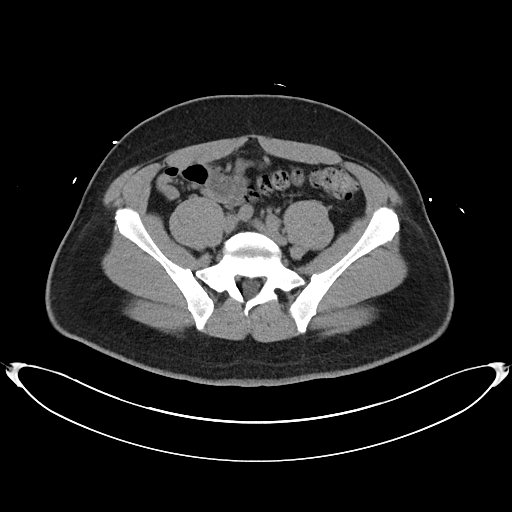
[im 52/99  soft-tissue]
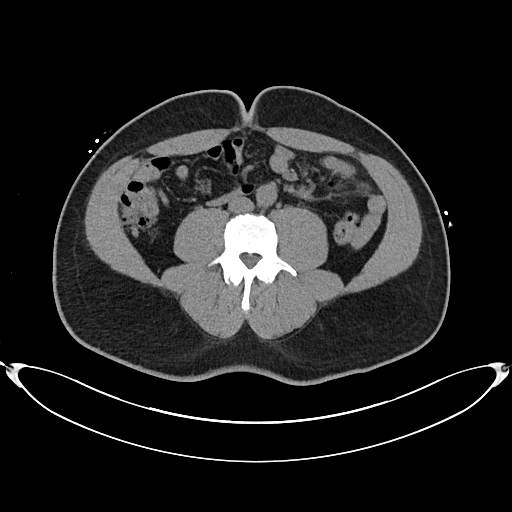
[im 58/99  soft-tissue]
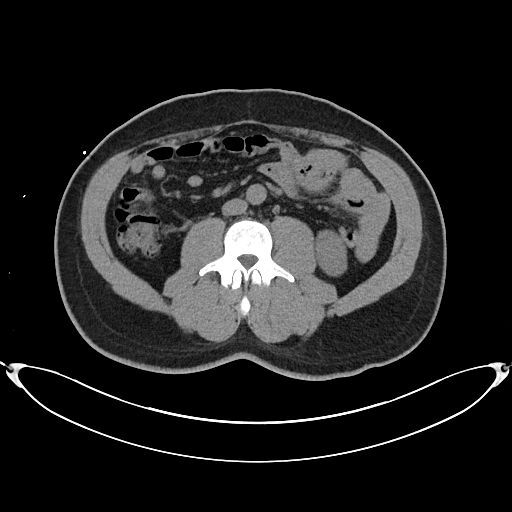
[im 64/99  soft-tissue]
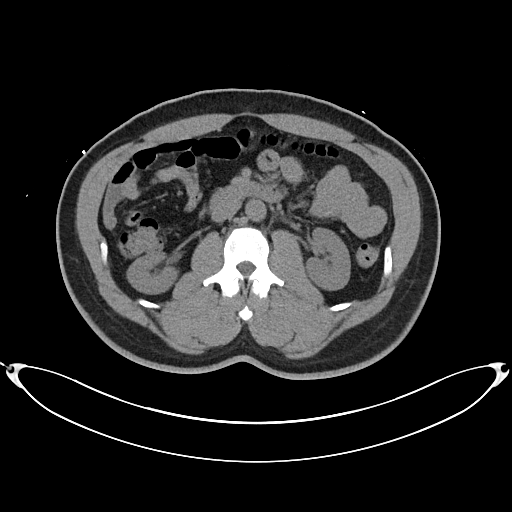
[im 64/99  bone]
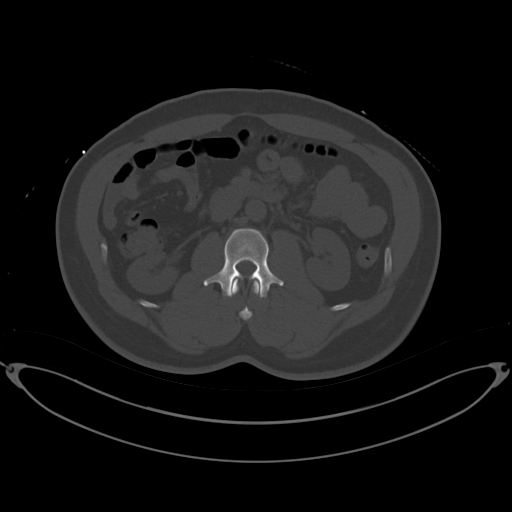
[im 70/99  soft-tissue]
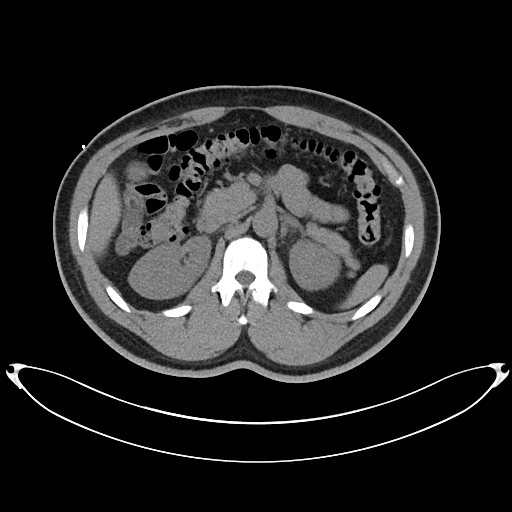
[im 75/99  soft-tissue]
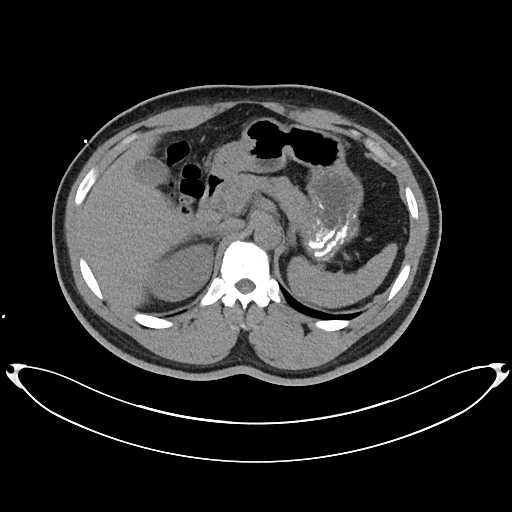
[im 87/99  soft-tissue]
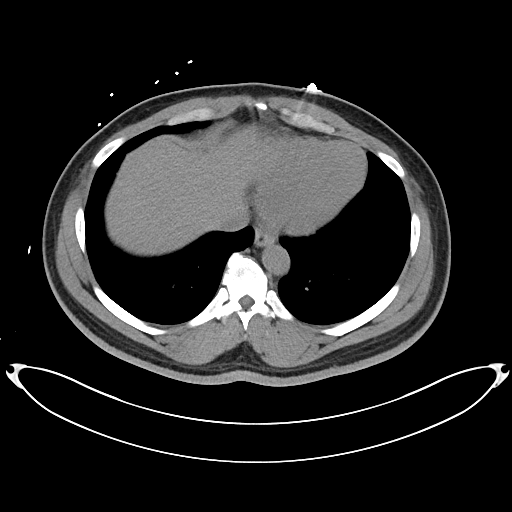
[im 93/99  soft-tissue]
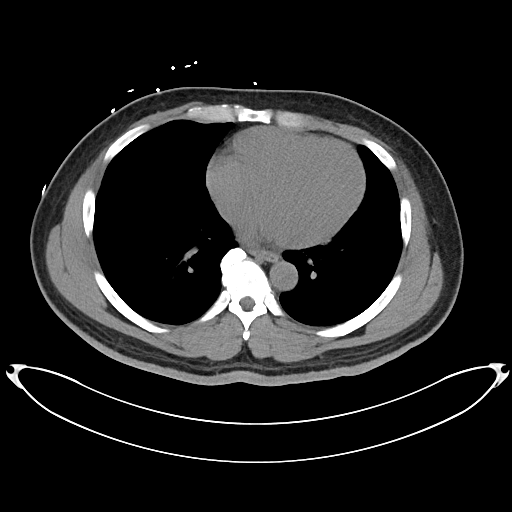

[Series 6: cor · coronal · 0.85mm/px · 3 of 102 slices shown]
[im 34/102  soft-tissue]
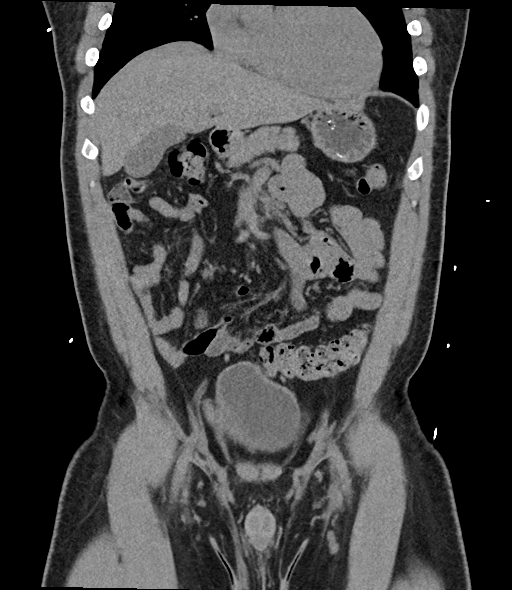
[im 45/102  soft-tissue]
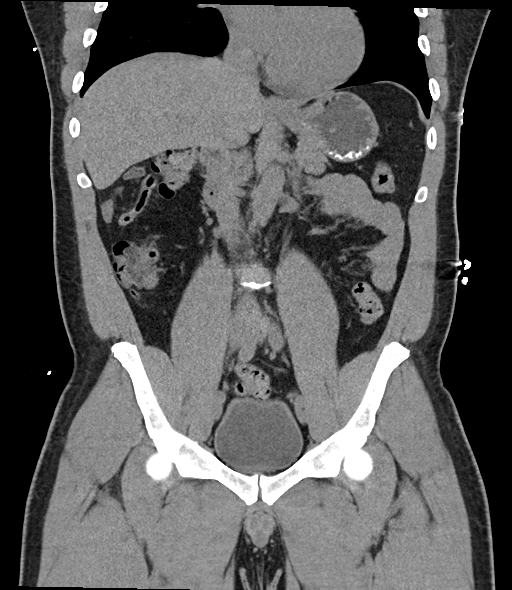
[im 57/102  soft-tissue]
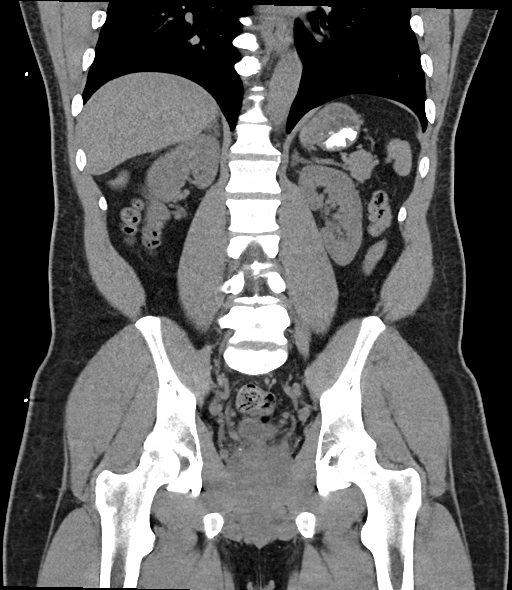

[16 of 46 positions shown; findings below may reference images not displayed]

FINDINGS: Lower chest: No acute abnormality.

Hepatobiliary: Unremarkable noncontrast appearance of the hepatic
parenchyma. Gallbladder is unremarkable. No biliary ductal dilation.

Pancreas: No pancreatic ductal dilation or evidence of acute
inflammation.

Spleen: Normal in size without focal abnormality.

Adrenals/Urinary Tract: Bilateral adrenal glands are unremarkable.
No hydronephrosis. No renal, ureteral or bladder calculi identified.
No solid enhancing renal mass. Urinary bladder is unremarkable for
degree of distension.

Stomach/Bowel: No enteric contrast was administered. Stomach is
mildly distended with ingested material without wall thickening. No
pathologic dilation of small or large bowel. The appendix and
terminal ileum appear normal. The colon is minimally distended
limiting evaluation. No evidence of acute bowel inflammation.

Vascular/Lymphatic: No significant vascular findings are present. No
pathologically enlarged abdominal or pelvic lymph nodes.

Reproductive: Prostate is unremarkable.

Other: No abdominal wall hernia or abnormality. No abdominopelvic
ascites.

Musculoskeletal: Multilevel degenerative changes spine. Mild
degenerative changes bilateral hips. No acute osseous abnormality.
IMPRESSION: No acute abdominopelvic findings. Specifically, no evidence of
obstructive uropathy.

## 2022-01-27 IMAGING — US US RENAL
1 series · 14 of 25 positions shown · non-contrast
Comparison: CT done on 09/04/2021

CLINICAL DATA: Renal dysfunction

EXAM:
RENAL / URINARY TRACT ULTRASOUND COMPLETE

[Series 1: us renal · 14 of 36 slices shown]
[im 1/36]
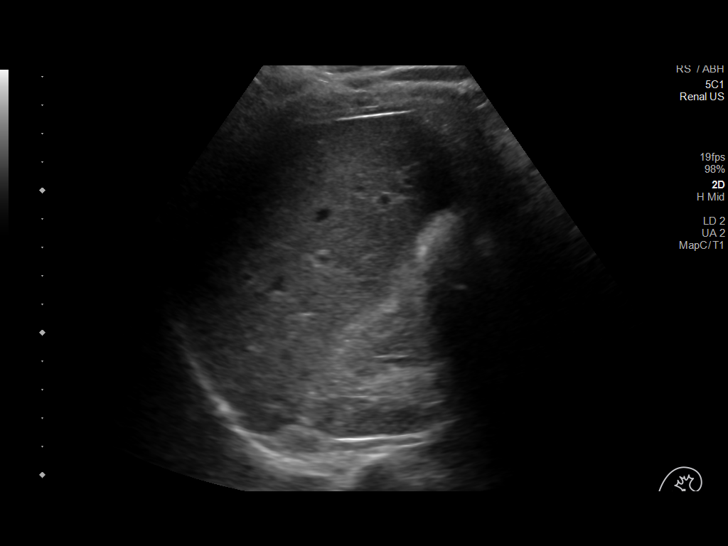
[im 3/36]
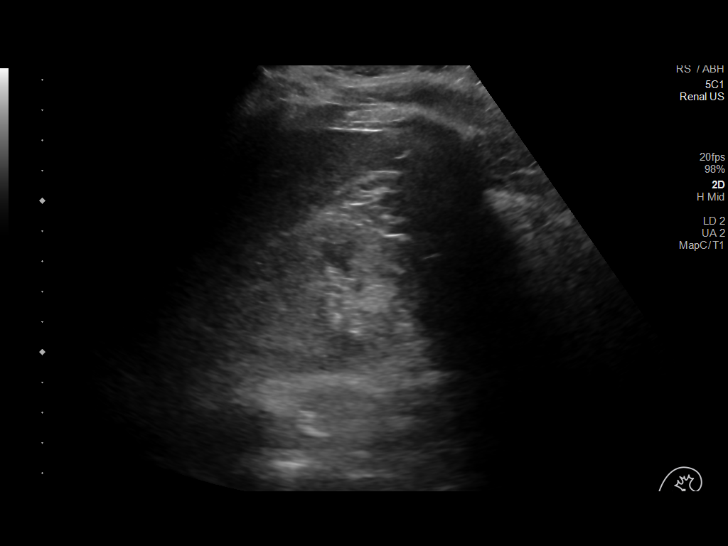
[im 6/36]
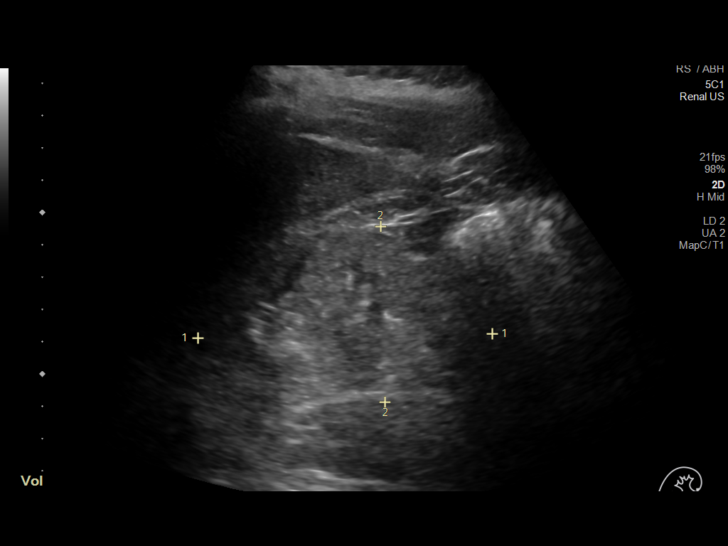
[im 9/36]
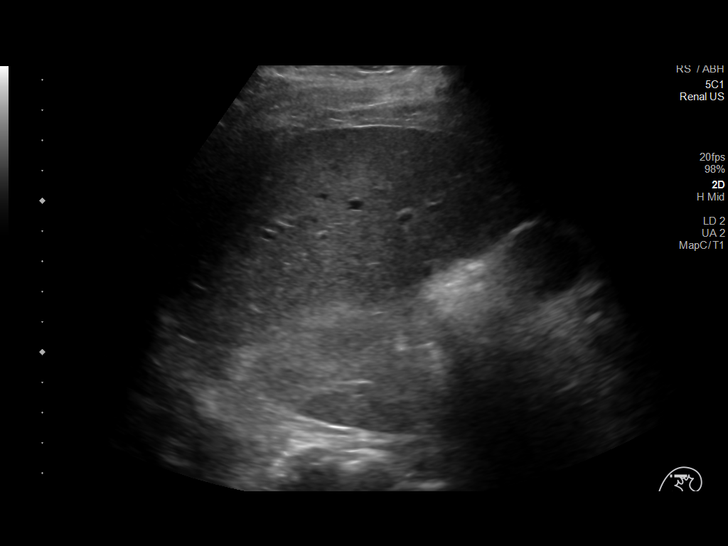
[im 12/36]
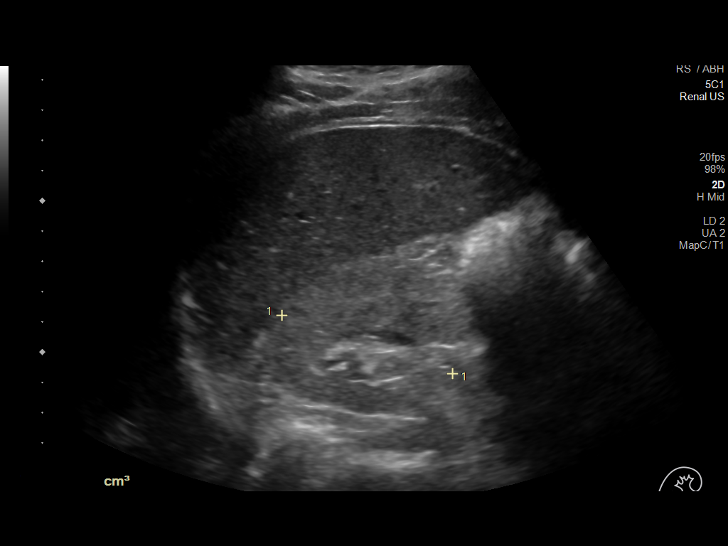
[im 14/36]
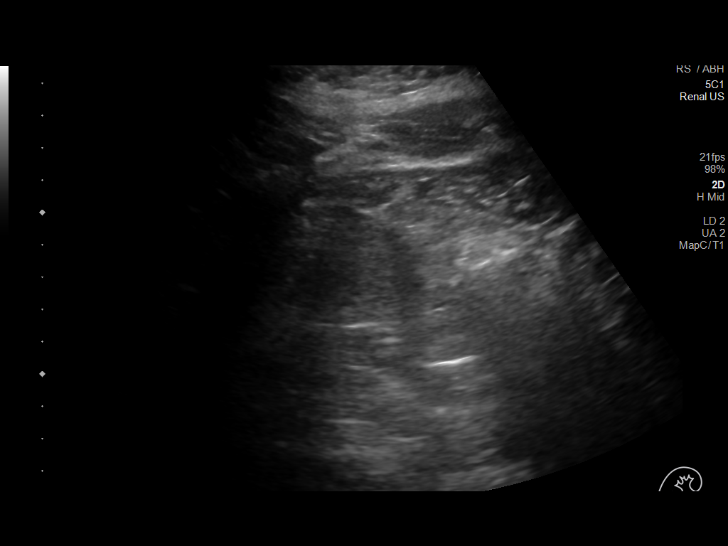
[im 17/36]
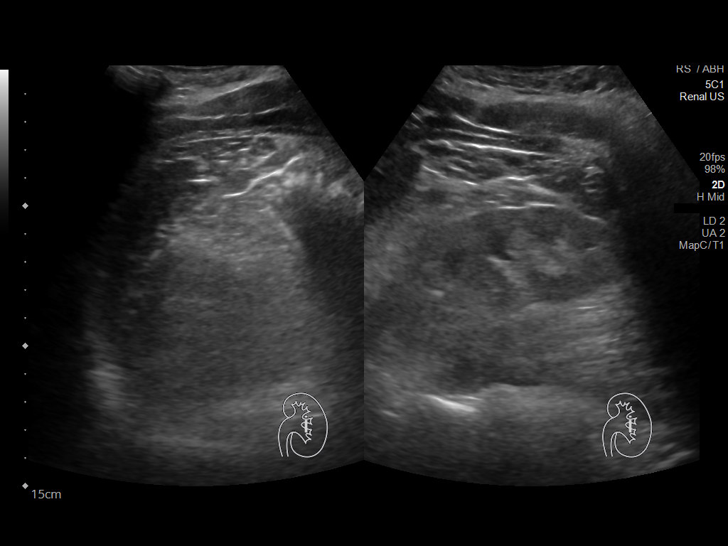
[im 19/36]
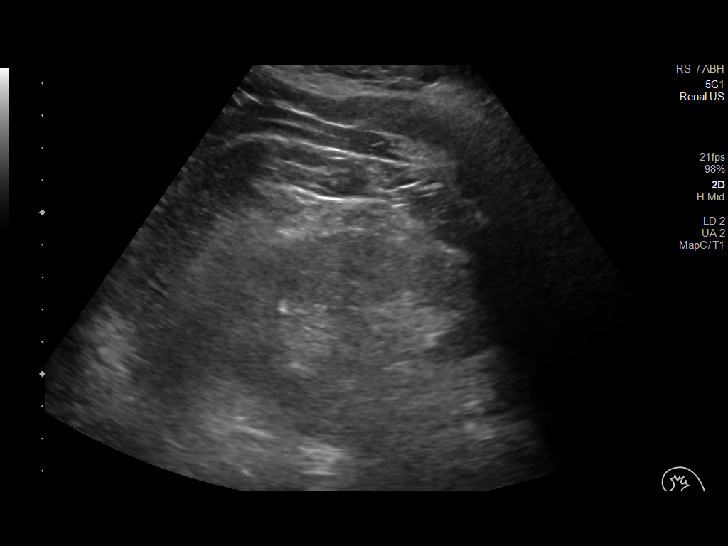
[im 22/36]
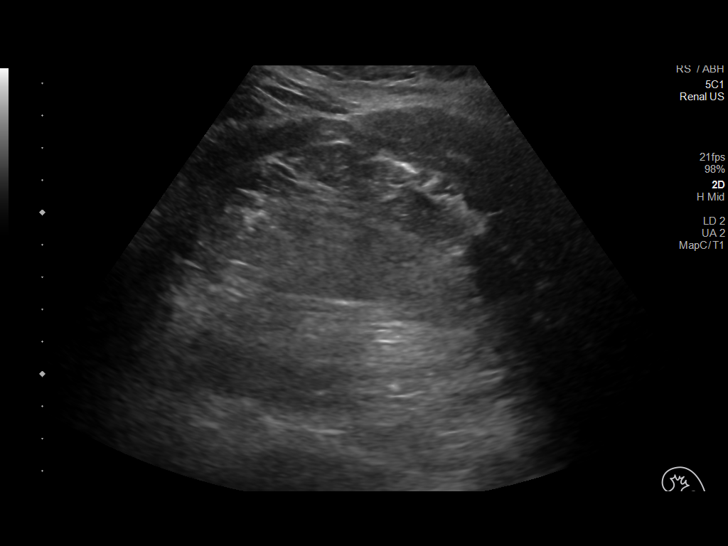
[im 24/36]
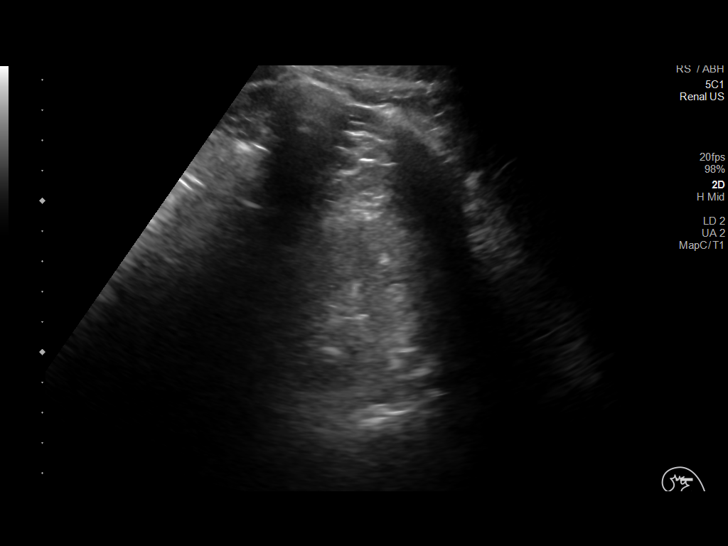
[im 27/36]
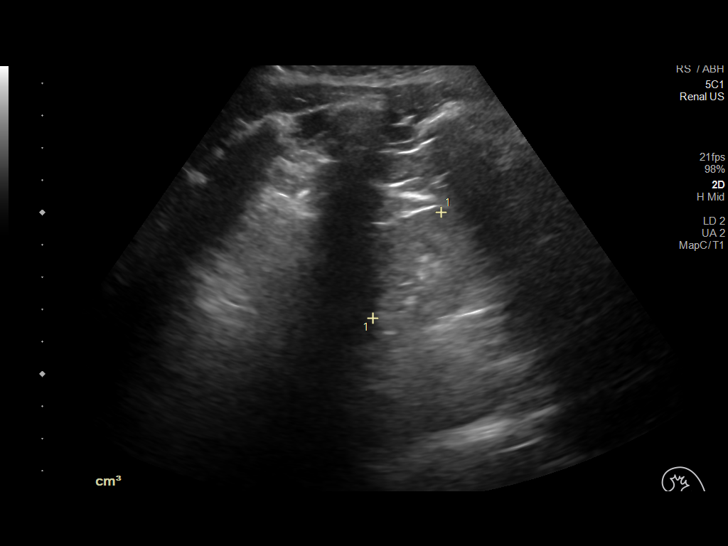
[im 30/36]
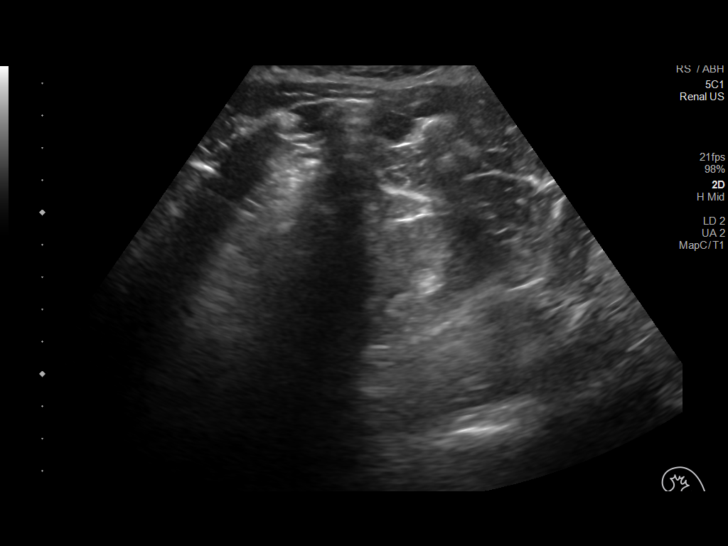
[im 33/36]
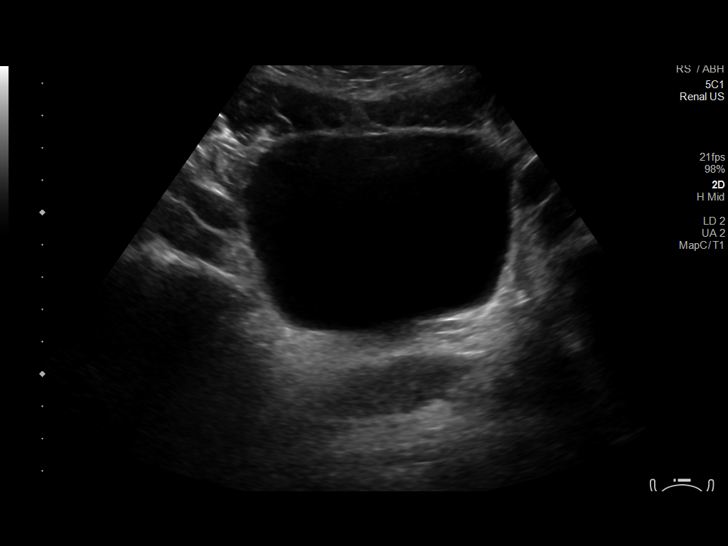
[im 36/36]
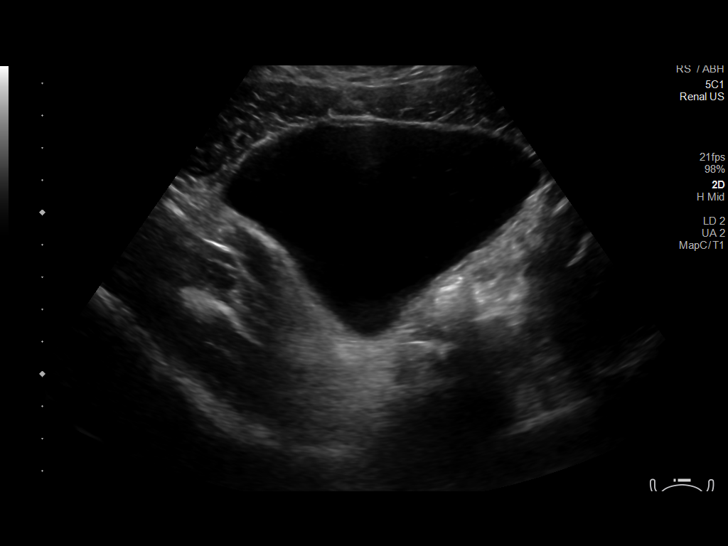

[14 of 25 positions shown; findings below may reference images not displayed]

FINDINGS: Right Kidney:

Renal measurements: 9.1 x 5.4 x 6 cm = volume: 154.7 mL. There is
marked increase in the cortical echogenicity. There is no
hydronephrosis.

Left Kidney:

Renal measurements: 8.8 x 4.9 x 3.9 cm = volume: 87.3 mL. There is
marked increase in echogenicity in the renal cortex. There is no
hydronephrosis.

Bladder:

Appears normal for degree of bladder distention.

Other:

None.
IMPRESSION: There is no hydronephrosis. There is marked increase in cortical
echogenicity in both kidneys suggesting medical renal disease.

## 2022-02-04 ENCOUNTER — Ambulatory Visit (INDEPENDENT_AMBULATORY_CARE_PROVIDER_SITE_OTHER): Payer: Commercial Managed Care - PPO

## 2022-02-04 DIAGNOSIS — Z0000 Encounter for general adult medical examination without abnormal findings: Secondary | ICD-10-CM

## 2022-02-04 NOTE — Progress Notes (Signed)
Appointment Outcome: Completed, Session #: 3 Start time: 12:34pm   End time: 12:59pm   Total Mins: 25 minutes    AGREEMENTS SECTION   Overall Goal(s): Improve healthy eating habits Stress management                                                Agreement/Action Steps:  Improve healthy eating habits Implement DASH Diet Continue to eat fresh produce Continue to limit the use of spices Continue to limit fried foods Aim to consume 2,072m or less of sodium per day Read food labels Continue to limit the use of salt while cooking Start meal planning/prepping     Stress management Use support system Start counseling on June 20th Practice deep breathing Take walks around neighborhood    Progress Notes:  Patient shared that maintaining his eating habits has been going well. Patient is currently on vacation and stated that he has been paying attention to what he orders to eat. Patient mentioned that he chooses the healthiest options possible and don't overindulge when eating to keep his sodium consumption to a minimum. Patient stated that the only fried food he has eating is [place], but limits how often he eats them.   Patient reported that the prior week he was out of town for work and had to eat out for dinner. Patient stated that the hotel did not have great options to choose from for breakfast so he would grab and apple and did not eat out during work hours. Patient implemented this approach to minimize consumption of processed and fast food to keep his sodium consumption to a minimum.   Patient mentioned that he was still able to decompress after work and talk to his wife over the phone to aid in stress management. Patient stated that the main stress he experienced was work related and trying to make it back for his daughter's graduation. Patient shared that when stressed at work he implemented deep breathing and praying throughout the day. Patient mentioned that starting his  day off with prayer helps keep him grounded.     Indicators of Success and Accountability: Patient shared that him maintaining his steps and expressing himself more are his indicators of success and accountability.  Readiness: Patient is in the action phase of improving his healthy eating habits and managing stress.  Strengths and Supports: Patient is being supported by his wife. Patient's faith and being consistent with implementing his action steps are his strengths.  Challenges and Barriers: Patient does not foresee any challenges/barriers to implementing his action steps over the next two weeks.     Coaching Outcomes: Patient did not make any changes to his action steps and will continue to implement them as outlined above.  While on vacation patient will continue to choose the healthiest options and not overindulge when eating to maintain a minimum consumption of sodium and fried foods.   Patient will meet with Dr. SMichail Sermonon June 20th to start counseling.    Attempted: Fulfilled - Patient is continuing to implement action the DASH diet by monitoring his sodium intake by reading food labels, limiting fried foods, and eating fresh produce. Patient has continued to utilize his support system, and practice deep breathing to manage stress.   Not met - Patient was not able to start meal planning/prepping over the past two weeks. Patient  has not been able to walk around neighborhood over the past two weeks due to being out of town for work and going on vacation. Patient has not been able to limit the amount of salt and spices used to cook his foods because he has not been home over the past two weeks.

## 2022-02-10 ENCOUNTER — Telehealth: Payer: Self-pay

## 2022-02-10 NOTE — Telephone Encounter (Signed)
Contacted patient regarding out of range biometric reading average BP for two weeks 143/96 with the high being  161/109    in the [employer] portal. Patient believes it is related to  his very busy lifestyle. Requested that the patient sit for 15 minutes before taking a BP. He stated, "I  am always on the go and it is hard for me to stay sitting for 15 minutes. He stated that his BP has really come down since he started monitoring and that he did not want a medication change".   Patient report No symptoms  [place], Janace Hoard, RN

## 2022-02-11 ENCOUNTER — Ambulatory Visit (INDEPENDENT_AMBULATORY_CARE_PROVIDER_SITE_OTHER): Payer: Self-pay | Admitting: Psychologist

## 2022-02-11 DIAGNOSIS — F411 Generalized anxiety disorder: Secondary | ICD-10-CM

## 2022-02-11 NOTE — Plan of Care (Signed)

## 2022-02-11 NOTE — Progress Notes (Signed)
[facility] Counselor Initial Adult Exam  Name: Paul Rivers Date: 02/11/2022 MRN: [id number] DOB: [birth year] PCP: [place], Betty G, MD  Time spent: 3:05 pm to 3:30 pm; total time: 25 minutes  This session was held via in person. The patient consented to in-person therapy and was in the clinician's office. Limits of confidentiality were discussed with the patient.   Guardian/Payee:  NA    Paperwork requested: No   Reason for Visit /Presenting Problem: Anxiety  Mental Status Exam: Appearance:   Well Groomed     Behavior:  Appropriate  Motor:  Normal  Speech/Language:   Clear and Coherent  Affect:  Appropriate  Mood:  normal  Thought process:  normal  Thought content:    WNL  Sensory/Perceptual disturbances:    WNL  Orientation:  oriented to person, place, and time/date  Attention:  Good  Concentration:  Good  Memory:  WNL  Fund of knowledge:   Good  Insight:    Fair  Judgment:   Good  Impulse Control:  Good    Reported Symptoms:  The patient endorsed experiencing the following: racing thoughts, feeling on edge, difficulty with sleep (falling asleep), feeling overwhelmed by thoughts, rumination of thoughts, hypertension, shortness of breath, and easily feeling overwhelmed. He denied suicidal and homicidal ideation.   Risk Assessment: Danger to Self:  No Self-injurious Behavior: No Danger to Others: No Duty to Warn:no Physical Aggression / Violence:No  Access to Firearms a concern: No  Gang Involvement:No  Patient / guardian was educated about steps to take if suicide or homicide risk level increases between visits: n/a While future psychiatric events cannot be accurately predicted, the patient does not currently require acute inpatient psychiatric care and does not currently meet [facility] involuntary commitment criteria.  Substance Abuse History: Current substance abuse: No     Past Psychiatric History:   No previous psychological problems  have been observed Outpatient Providers:NA History of Psych Hospitalization: No  Psychological Testing:  NA    Abuse History:  Victim of: No.,  NA    Report needed: No. Victim of Neglect:No. Perpetrator of  NA   Witness / Exposure to Domestic Violence: No   Protective Services Involvement: No  Witness to [employer] Violence:  No   Family History:  Family History  Problem Relation Age of Onset   Diabetes Mother    Hypertension Mother    Diabetes Father    Hypertension Father    Stroke Father    Hypertension Brother    Hypertension Maternal Grandmother     Living situation: the patient lives with their family  Sexual Orientation: Straight  Relationship Status: married  Name of spouse / other:Nicole If a parent, number of children / ages:Patient has two biological daughters who are 54 and 54 years old. Patient has a 88 year old step son.   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   Educational History: Education: high school diploma/GED  Religion/Sprituality/World View: Christian  Any cultural differences that may affect / interfere with treatment:  not applicable   Recreation/Hobbies: Cooking  Stressors: Health problems    Strengths: Supportive Relationships  Barriers:  NA   Legal History: Pending legal issue / charges: The patient has no significant history of legal issues. History of legal issue / charges:  NA  Medical History/Surgical History: reviewed Past Medical History:  Diagnosis Date   CKD (chronic  kidney disease) stage 4, GFR 15-29 ml/min (HCC) 06/04/2019   Diabetes mellitus without complication ([place])    Hyperlipidemia    Hypertension    Prolonged QT interval 09/05/2021    Past Surgical History:  Procedure Laterality Date   BIOPSY  09/07/2021   Procedure: BIOPSY;  Surgeon: Jerene Bears, MD;  Location: [facility] ENDOSCOPY;   Service: Gastroenterology;;   ESOPHAGOGASTRODUODENOSCOPY (EGD) WITH PROPOFOL N/A 09/07/2021   Procedure: ESOPHAGOGASTRODUODENOSCOPY (EGD) WITH PROPOFOL;  Surgeon: Jerene Bears, MD;  Location: [facility] ENDOSCOPY;  Service: Gastroenterology;  Laterality: N/A;    Medications: Current Outpatient Medications  Medication Sig Dispense Refill   amLODipine (NORVASC) 10 MG tablet Take 1 tablet (10 mg total) by mouth daily. 90 tablet 3   Blood Glucose Monitoring Suppl (ACCU-CHEK AVIVA CONNECT) w/Device KIT 1 Device by Does not apply route daily. 1 kit 0   carvedilol (COREG) 25 MG tablet Take 1 tablet (25 mg total) by mouth 2 (two) times daily with a meal. 60 tablet 3   chlorthalidone (HYGROTON) 25 MG tablet Take 25 mg by mouth daily.     doxazosin (CARDURA) 8 MG tablet Take 1 tablet (8 mg total) by mouth 2 (two) times daily. 90 tablet 2   hydrALAZINE (APRESOLINE) 50 MG tablet TAKE 1 TABLET BY MOUTH THREE TIMES DAILY 270 tablet 1   pantoprazole (PROTONIX) 40 MG tablet Take 1 tablet (40 mg total) by mouth 2 (two) times daily before a meal. Twice daily before first and last meal of the day x8 weeks, and then switch to daily dosing. 60 tablet 3   potassium chloride SA (KLOR-CON M) 20 MEQ tablet Take 1 tablet by mouth once daily (Patient not taking: Reported on 12/17/2021) 30 tablet 1   spironolactone (ALDACTONE) 25 MG tablet Take 1 tablet (25 mg total) by mouth daily. 30 tablet 2   No current facility-administered medications for this visit.    No Known Allergies  Diagnoses:  F41.1 generalized anxiety disorder  Plan of Care: The patient is a 41 year old 2 male who was referred due to experiencing stress secondary to hypertension. The patient lives at home with his wife, and two daughters. The patient meets criteria for a diagnosis of F41.1 generalized anxiety disorder based off of the following: racing thoughts, feeling on edge, difficulty with sleep (falling asleep), feeling overwhelmed by thoughts,  rumination of thoughts, hypertension, shortness of breath, and easily feeling overwhelmed. He denied suicidal and homicidal ideation.   The patient stated that he wants coping strategies.   This psychologist makes the recommendation that the patient participate in therapy at least once a month and if possible bi-weekly.   Conception Chancy, PsyD

## 2022-02-11 NOTE — Progress Notes (Signed)
                Del Wiseman, PsyD 

## 2022-02-12 ENCOUNTER — Telehealth: Payer: Self-pay

## 2022-02-12 DIAGNOSIS — Z0000 Encounter for general adult medical examination without abnormal findings: Secondary | ICD-10-CM

## 2022-02-12 NOTE — Telephone Encounter (Signed)
Called patient to ensure that he has questions about his medications as indicated per his response to his [employer] survey question and to determine if he is having any connectivity issues due to multiple delayed readings that populated today in the patient's chart that were not present yesterday, which was recognized by Aileen Fass, RN.  Patient stated that he does not have questions about his medications and he hit that answer by accident. Patient stated that he does not have any connectivity issues with his device or have to enter any readings manually. Patient stated that he talked to someone about his blood pressures being high recently and shared with me it was due to what he had ate along with him returning to work after vacation.   Will follow up with patient about his healthy eating behaviors and stress management during next scheduled health coaching session.  Paul Rivers, [facility] Guide, Health Coach [address], [address] [place] [address] Telephone: [phone] Email: [email or web address].

## 2022-02-18 ENCOUNTER — Ambulatory Visit: Payer: Commercial Managed Care - PPO

## 2022-02-18 ENCOUNTER — Ambulatory Visit (INDEPENDENT_AMBULATORY_CARE_PROVIDER_SITE_OTHER): Payer: Commercial Managed Care - PPO

## 2022-02-18 ENCOUNTER — Encounter: Payer: Self-pay | Admitting: Pharmacist Clinician (PhC)/ Clinical Pharmacy Specialist

## 2022-02-18 ENCOUNTER — Ambulatory Visit (INDEPENDENT_AMBULATORY_CARE_PROVIDER_SITE_OTHER): Payer: Commercial Managed Care - PPO | Admitting: Pharmacist Clinician (PhC)/ Clinical Pharmacy Specialist

## 2022-02-18 DIAGNOSIS — I10 Essential (primary) hypertension: Secondary | ICD-10-CM | POA: Diagnosis not present

## 2022-02-18 DIAGNOSIS — Z0000 Encounter for general adult medical examination without abnormal findings: Secondary | ICD-10-CM

## 2022-02-18 MED ORDER — CLONIDINE 0.1 MG/24HR TD PTWK: 0.1000 mg | MEDICATED_PATCH | TRANSDERMAL | 12 refills | Status: DC

## 2022-02-18 NOTE — Progress Notes (Signed)
Appointment Outcome: Completed, Session #: 4 Start time: 8:45am   End time: 9:10am   Total Mins: 25 minutes  AGREEMENTS SECTION    Overall Goal(s): Improve healthy eating habits Stress management                                                 Agreement/Action Steps:  Improve healthy eating habits Implement DASH Diet Continue to eat fresh produce Continue to limit the use of spices Continue to limit fried foods Aim to consume 2,000mg  or less of sodium per day Read food labels Continue to limit the use of salt while cooking Start meal planning/prepping   Stress management Use support system Start counseling on June 20th Practice deep breathing Take walks around neighborhood  Progress Notes:  Patient stated that his session with the counselor went well, and he will be scheduling another session. Patient shared that he is still talking to his wife each night and that he has begun to share things with other family members. Patient mentioned that he only has to implement deep breathing when he is at work. Patient continues to sit in his vehicle after work to decompress before switching hats when he arrives home.   Patient stated that prayer continues to keep him grounded. Patient reported that he has walked 5 times over the past two weeks, with each walk taking longer than 30 minutes. Patient shared an incident where he was walking while it was hot in the morning after taking his blood pressure medication, and once the medication kicked in, he did not feel well outside in the heat.   Patient recalled that the only thing he has fried at home is [place] fries. Patient stated that he continues to monitor what he eats and how much of the food he eats. Patient eat smaller portions and knows his limit with sodium now. Patient stated that if he can taste the salt, he knows it is too much sodium in the food. Patient stated that he if finds himself home and eat more than normal, he is mindful to  cut back on what he eats the next day.   Patient explained that he can tell the difference in his body when he has eaten something that may have raised his blood pressure. Patient stated that being back home from vacation and working out of town, sticking to his healthy eating habits is easier to maintain. Patient stated that he continues to read food labels when grocery shopping, which has become a habit since he was diagnosed with hypertension.   Patient mentioned that when he eats fruit it is either Fuji apples or seedless grapes. Patient stated that he prefers to eat fresh vegetables, but if he does buy a can food, he rinses the contents off before cooking. Patient reported that other canned goods like diced tomatoes that he purchases is no salt added. Patient stated that he is using blended seasonings that is low in sodium and he doesn't have to use salt much any other time he cooks. Patient shared that he really cut back on using spicy seasonings on his food.   Patient shared that he has purchased a Panini press that he uses for his sandwiches. Patient stated that he purchases deli meat from the store that must be sliced such as [place] or chicken. Patient cut out eating ham as  he did previously. Patient stated that him and his wife discussed meal planning/prepping, but he is the only one that is interested in eating leftovers, so he is not sure if meal prepping is going to be a good fit.    Indicators of Success and Accountability:  Patient mentioned that talking to other family members was helpful in managing stress and opening up about different things.  Readiness: Patient is in the action phase of stress management and improving his healthy eating habits.  Strengths and Supports: Patient is being supported by his wife and family. Patient stated that being more mindful of his eating behaviors has been his strength to manage his healthy eating habits.  Challenges and Barriers: Patient does not  foresee any challenges to implementing his action steps over the next 2 weeks.   Coaching Outcomes: Asked patient if there were any additional changes that he would like to make to his eating habits that he feels he need to work on. Patient wasn't sure at the time of any additional changes that he needs/wants to make. Patient stated at this time that he drinks mainly water but may have half and half (lemonade and tea) when he is at a restaurant. Patient stated that he doesn't eat many sweets or consume food with a lot of sugar in it.   Patient will continue to implement his action steps as outlined above over the next two weeks, except for starting to meal plan/prep.   Attempted: Fulfilled - Patient continues to eat fresh produce, limit use of spices and salt while cooking, limit his consumption of fried foods, and limit his consumption of sodium to approximately 2,000mg  or less daily by reading food labels. Patient is utilizing his support system, started counseling, practice deep breathing, and taking walks around his neighborhood.    Not attempted: Deferred - Patient has discussed meal planning/prepping with wife but have not decided if it's a good cooking strategy for their family at this time.

## 2022-02-24 LAB — BASIC METABOLIC PANEL
BUN: 45 — AB (ref 4–21)
CO2: 22 (ref 13–22)
Chloride: 101 (ref 99–108)
Creatinine: 3.7 — AB (ref 0.6–1.3)
Glucose: 117
Potassium: 4.5 mEq/L (ref 3.5–5.1)
Sodium: 135 — AB (ref 137–147)

## 2022-02-24 LAB — COMPREHENSIVE METABOLIC PANEL
Albumin: 5.1 — AB (ref 3.5–5.0)
Calcium: 9.5 (ref 8.7–10.7)

## 2022-02-28 ENCOUNTER — Encounter: Payer: Self-pay | Admitting: Family Medicine

## 2022-03-04 ENCOUNTER — Ambulatory Visit (INDEPENDENT_AMBULATORY_CARE_PROVIDER_SITE_OTHER): Payer: Commercial Managed Care - PPO

## 2022-03-04 DIAGNOSIS — Z0000 Encounter for general adult medical examination without abnormal findings: Secondary | ICD-10-CM

## 2022-03-04 NOTE — Progress Notes (Signed)
Appointment Outcome: Completed, Session #: 5 Start time: 12:31pm   End time: 12:55pm   Total Mins: 24 minutes   AGREEMENTS SECTION  Overall Goal(s): Improve healthy eating habits Stress management                                               Agreement/Action Steps:  Improve healthy eating habits Implement DASH Diet Continue to eat fresh produce Continue to limit the use of spices Continue to limit fried foods Aim to consume 2,000mg  or less of sodium per day Read food labels Continue to limit the use of salt while cooking   Stress management Use support system Continue counseling Practice deep breathing Take walks around neighborhood   Progress Notes:  Patient rated his stress level at a 4/10. Patient stated that he has been able to talk with his supervisor about job-related concerns that was causing him stress. Patient stated that being able to be open helped with discussing what he as an employee and management can do better to make the workplace less stressful. Patient shared that because of that conversation some changes are being made.   Patient stated that he still takes deep breaths while at work when he finds himself frustrated. Patient mentioned that being able to breath and take a step back helps him think clearer. Patient shared that he continues to pray as normal to help keep himself grounded. Patient reported that he is continuing to have discussions with his wife about how he is feeling and that his next upcoming counseling session is scheduled for 7/14. Patient shared that he has not been able to do his evening walks due to being exhausted from heat during work hours. Patient mentioned that he is working in a space where the temperature is approximately 110 compared to the temperature outside at 90.  Patient shared that he is staying hydrated by drinking water while at work. Patient reported that there are times when he skips breakfast and lunch and only eats dinner.  Patient stated that he rarely eats breakfast and if he eats something that is salty for lunch, he will have a light dinner. Patient stated that it hasn't been a challenge to managing his sodium intake because he doesn't eat all throughout the day and is mindful of food labels. Patient stated that if he eats heavy for dinner on Sunday (e.g., steak) that he will eat light on Monday to ensure that he is not having an increase in sodium during each meal.  Patient stated that there have been some changes that they made when grocery shopping due to reading food labels. Patient provided the example of using [employer] Cream of Mushroom to make chicken pot pie. Patient stated that after reading the food label and seeing the sodium content, he will purchase the no salt added version instead when he makes that dish again. Patient stated that he continues to eat fresh produce. Patient shared that he made a gumbo with fresh celery, onions, and bell peppers. Patient mentioned that it hasn't been hard maintaining limiting his use of salt and spices while cooking. Patient shared that he is more conscious of his eating habits and knows that he cannot tolerate heavy spices and consequences of eating spicy food.   Patient stated that he has been getting weird blood pressure readings recently with two low readings on Sunday. Patient  stated that he didn't eat that much on Sunday but didn't do anything out of the ordinary. Patient shared that he gets himself ready in the morning before checking his blood pressure and recognizes that his readings are higher in the mornings compared to evenings.   Patient shared that he has had fried food twice in the past two weeks. Patient mentioned that he ate fried chicken for lunch one day and had [place] fries two nights ago. Patient stated that he works to keep his fried food consumption to a minimum.   Discussed with patient about taking his medications at least 30 minutes before checking  his blood pressure and if he has been moving around a lot prior, to take 5-10 minutes to sit to relax before checking his readings to see if there is a difference. Patient stated that he must make changes to his routine so that he can implement these steps to get more accurate readings in the morning.   Indicators of Success and Accountability:  Patient stated that being more disciplined with his eating habits has been his indicator of success and accountability.  Readiness: Patient is in the action phase of stress management and improving his healthy eating behaviors.  Strengths and Supports: Patient is being supported by his family and counselor. Patient expressed that being disciplined, more conscious, and adaptable have been his strengths.  Challenges and Barriers: Patient does not foresee any challenges/barriers to implementing his actions because he can adapt better.   Coaching Outcomes: Patient will continue to implement his action steps as outlined above over the next two weeks.   Patient will engage in taking walks in the evenings when he is not experiencing heat exhaustion from working outside or in elevated temperature areas.    Attempted: Fulfilled - Patient completed the weekly agreement in full and was able to meet the challenge.

## 2022-03-07 ENCOUNTER — Ambulatory Visit (INDEPENDENT_AMBULATORY_CARE_PROVIDER_SITE_OTHER): Payer: Commercial Managed Care - PPO | Admitting: Psychologist

## 2022-03-07 DIAGNOSIS — F411 Generalized anxiety disorder: Secondary | ICD-10-CM | POA: Diagnosis not present

## 2022-03-07 NOTE — Progress Notes (Signed)
                Abubakr Wieman, PsyD 

## 2022-03-07 NOTE — Progress Notes (Signed)
[facility] Counselor/Therapist Progress Note  Patient ID: Paul Rivers, MRN: [id number],    Date: 03/07/2022  Time Spent: 11:05 am to 11:35 am; total time: 30 minutes   This session was held via in person. The patient consented to in-person therapy and was in the clinician's office. Limits of confidentiality were discussed with the patient.   Treatment Type: Individual Therapy  Reported Symptoms: Grief  Mental Status Exam: Appearance:  Well Groomed     Behavior: Appropriate  Motor: Normal  Speech/Language:  Clear and Coherent  Affect: Appropriate  Mood: normal  Thought process: normal  Thought content:   WNL  Sensory/Perceptual disturbances:   WNL  Orientation: oriented to person, place, and time/date  Attention: Good  Concentration: Good  Memory: WNL  Fund of knowledge:  Good  Insight:   Fair  Judgment:  Good  Impulse Control: Good   Risk Assessment: Danger to Self:  No Self-injurious Behavior: No Danger to Others: No Duty to Warn:no Physical Aggression / Violence:No  Access to Firearms a concern: No  Gang Involvement:No   Subjective: Beginning the session, the patient described himself as doing better indicating that his hypertension is under better control. After reviewing the treatment plan, patient stated that he wanted to process grief related to the death of his father Juanda Crumble). Patient spent the session processing this grief. He processed thoughts and emotions. He asked to follow and was agreeable to homework. He denied suicidal and homicidal ideation.    Interventions:  Worked on developing a therapeutic relationship with the patient using active listening and reflective statements. Provided emotional support using empathy and validation. Reviewed the treatment plan with the patient. Reviewed events since the intake. Praised patient for doing better. Identified goals for the session. Assisted the patient in doing a life review of his father.  Normalized and validated expressed thoughts and emotions. Used a Product/process development scientist to assist the patient gain insight into self. Provided psychoeducation about grief, Tear Soup, Ted Talk and empty chair technique. Processed the idea of writing a letter to his father. Used reflective statements. Processed thoughts and emotions. Provided empathic statements. Assigned homework. Assessed for suicidal and homicidal ideation.   Homework: Write letter to dad; eventually read Tear Soup and watch Clare Gandy Talk  Next Session: Review homework and emotional support. Empty chair technique possibly  Diagnosis: F41.1 generalized anxiety disorder  Plan:  Goals Reduce overall frequency, intensity, and duration of anxiety Stabilize anxiety level wile increasing ability to function Enhance ability to effectively cope with full variety of stressors Learn and implement coping skills that result in a reduction of anxiety   Objectives target date for all objectives is 02/12/2023 Verbalize an understanding of the cognitive, physiological, and behavioral components of anxiety Learning and implement calming skills to reduce overall anxiety Verbalize an understanding of the role that cognitive biases play in excessive irrational worry and persistent anxiety symptoms Identify, challenge, and replace based fearful talk Learn and implement problem solving strategies Identify and engage in pleasant activities Learning and implement personal and interpersonal skills to reduce anxiety and improve interpersonal relationships Learn to accept limitations in life and commit to tolerating, rather than avoiding, unpleasant emotions while accomplishing meaningful goals Identify major life conflicts from the past and present that form the basis for present anxiety Maintain involvement in work, family, and social activities Reestablish a consistent sleep-wake cycle Cooperate with a medical evaluation  Interventions Engage the patient in  behavioral activation Use instruction, modeling, and role-playing to build the client's  general social, communication, and/or conflict resolution skills Use Acceptance and Commitment Therapy to help client accept uncomfortable realities in order to accomplish value-consistent goals Reinforce the client's insight into the role of his/her past emotional pain and present anxiety  Support the client in following through with work, family, and social activities Teach and implement sleep hygiene practices  Refer the patient to a physician for a psychotropic medication consultation Monito the clint's psychotropic medication compliance Discuss how anxiety typically involves excessive worry, various bodily expressions of tension, and avoidance of what is threatening that interact to maintain the problem  Teach the patient relaxation skills Assign the patient homework Discuss examples demonstrating that unrealistic worry overestimates the probability of threats and underestimates patient's ability  Assist the patient in analyzing his or her worries Help patient understand that avoidance is reinforcing   The patient and clinician reviewed the treatment plan on 7.14.2023. The patient approved of the treatment plan.    Conception Chancy, PsyD

## 2022-03-18 ENCOUNTER — Ambulatory Visit (INDEPENDENT_AMBULATORY_CARE_PROVIDER_SITE_OTHER): Payer: Commercial Managed Care - PPO

## 2022-03-18 DIAGNOSIS — Z0000 Encounter for general adult medical examination without abnormal findings: Secondary | ICD-10-CM
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
## 2022-04-04 ENCOUNTER — Ambulatory Visit: Payer: Commercial Managed Care - PPO | Admitting: Psychologist

## 2022-04-09 ENCOUNTER — Telehealth: Payer: Self-pay

## 2022-04-09 DIAGNOSIS — Z0000 Encounter for general adult medical examination without abnormal findings: Secondary | ICD-10-CM

## 2022-04-09 NOTE — Telephone Encounter (Signed)
Left message on patient's wife number listed requesting a callback from patient due to not being able to leave a voicemail on patient's phone.

## 2022-04-09 NOTE — Telephone Encounter (Signed)
Called patient to determine if they are having connectivity issues due to missing bp readings in [place]. Was not able to leave a message due to voicemail being full. Sent a message via [employer] portal to connect with patient regarding missing bp readings.    Paul Rivers, [facility] Guide, Health Coach [address], [address] [place] [address] Telephone: [phone] Email: [email or web address]

## 2022-04-10 ENCOUNTER — Telehealth: Payer: Self-pay

## 2022-04-10 DIAGNOSIS — Z0000 Encounter for general adult medical examination without abnormal findings: Secondary | ICD-10-CM

## 2022-04-10 NOTE — Telephone Encounter (Signed)
Patient's wife returned call regarding message that I left yesterday per her husband's permission to callback since he is in a class during working hours. Mrs. Allsup was informed that his bp readings have not registered since August 8th and was asking if he was having any connectivity issues and having to enter his readings manually. She stated that the machine is working and it was used on Sunday to check their daughter's bp after fainting. Patient informed her that the reading wouldn't register since his phone was not nearby. Mrs. Weisensel stated that she would talk to him this evening since he is not being compliant. Informed her that I will be checking to see if any readings register moving forward and would touch base with them again if no readings populate by 8/21 to determine if a tech support ticket needs to be submitted. Mrs. Hernon verbally expressed understanding.    Star Cheese Truman Hayward, [facility] Guide, Health Coach [address] [place] [address] Telephone: [phone] Email: [email or web address]

## 2022-04-17 ENCOUNTER — Ambulatory Visit (INDEPENDENT_AMBULATORY_CARE_PROVIDER_SITE_OTHER): Payer: Commercial Managed Care - PPO | Admitting: Cardiovascular Disease

## 2022-04-17 ENCOUNTER — Encounter (HOSPITAL_BASED_OUTPATIENT_CLINIC_OR_DEPARTMENT_OTHER): Payer: Self-pay | Admitting: Cardiovascular Disease

## 2022-04-17 DIAGNOSIS — Z006 Encounter for examination for normal comparison and control in clinical research program: Secondary | ICD-10-CM

## 2022-04-17 DIAGNOSIS — I10 Essential (primary) hypertension: Secondary | ICD-10-CM

## 2022-04-17 DIAGNOSIS — I1A0 Resistant hypertension: Secondary | ICD-10-CM

## 2022-04-17 MED ORDER — CLONIDINE HCL 0.1 MG PO TABS: 0.1000 mg | ORAL_TABLET | Freq: Two times a day (BID) | ORAL | 1 refills | Status: DC

## 2022-04-17 NOTE — Assessment & Plan Note (Addendum)
Blood pressure has improved substantially but still above goal.  He is not taking the clonidine patches because they were causing skin irritation.  We will put him on 0.1 mg twice daily of clonidine tablets.  Continue amlodipine, carvedilol, chlorthalidone, hydralazine, and spironolactone.  He continues to follow-up with nephrology regularly.  He will continue in our [place] remote patient monitoring system and follow-up in 2 months.

## 2022-04-17 NOTE — Research (Signed)
I saw pt today after Dr. Talmage's follow up visit. Pt is in Dr. Danbury's Virtual Care HTN Study. Pt filled out research survey. Pt was enrolled in Group 2.  

## 2022-04-17 NOTE — Progress Notes (Signed)
Advanced Hypertension Clinic Follow-up:    Date:  04/17/2022   ID:  Paul Rivers, DOB 05-13-1981, MRN [id number]  PCP:  Martinique, Betty G, MD  Cardiologist:  Evalina Field, MD  Nephrologist:  Referring MD: Martinique, Betty G, MD   CC: Hypertension  History of Present Illness:    Paul Rivers is a 41 y.o. male with a hx of hypertension, diabetes, CKD III, hyperlipidemia, and obesity, here for follow-up. He was initially seen 12/17/21 in the Advanced Hypertension Clinic. He saw Dr. Farris Has 09/2021 and his BP was 160/90 despite being on carvedilol, amlodipine, hydralazine, and spironolactone. Testing for hyperaldosteronism has been negative, as has pheochromocytoma. He had an abdominal CT 08/2021 with no adenomas. Renal dopplers 05/2019 were normal. He was referred to discuss renal denervation. He was seen in the ED 08/2021 with hypertensive emergency and AKI in the setting of stopping all his blood pressure medications. He stopped his medicine due to abdominal pain. Abdominal CT was unremarkable. He was found to have a gastric ulcer. His BP was 151/103 in the hospital on all of his medicines. He saw nephrology 11/2021 and chlorthalidone was changed to 25 mg, and spironolactone was changed to 50 mg daily. At that visit his BP was 143/82.  At the initial visit we added doxazosin. Hydralazine was not increased at the recommendation of nephrology. He followed up with our pharmacist 01/2022 and doxazosin had been increased. Clonidine patches were also added. Lately in [place], his blood pressures have been in the 120s-140s/70s-100s. He has been working with our care guide for stress management.   Today, he is feeling pretty good. He reports that his blood pressure is higher when he checks it in the morning before leaving for work, usually around 6:30 AM. The morning readings will generally be in the 003B systolic. Later in the day after being at home the systolic will tend to be in the 130s. Usually he  takes all of his morning medications around 6AM, and his evening dose of carvedilol around 8:15 PM. He has experienced some skin irritation with the clonidine patches and has stopped wearing them for about a week. He also notes that he frequently works outside in the heat. He denies any palpitations, chest pain, shortness of breath, or peripheral edema. No lightheadedness, headaches, syncope, orthopnea, or PND.   Previous antihypertensives: N/a  Past Medical History:  Diagnosis Date   CKD (chronic kidney disease) stage 4, GFR 15-29 ml/min (HCC) 06/04/2019   Diabetes mellitus without complication ([place])    Hyperlipidemia    Hypertension    Prolonged QT interval 09/05/2021    Past Surgical History:  Procedure Laterality Date   BIOPSY  09/07/2021   Procedure: BIOPSY;  Surgeon: Jerene Bears, MD;  Location: [facility] ENDOSCOPY;  Service: Gastroenterology;;   ESOPHAGOGASTRODUODENOSCOPY (EGD) WITH PROPOFOL N/A 09/07/2021   Procedure: ESOPHAGOGASTRODUODENOSCOPY (EGD) WITH PROPOFOL;  Surgeon: Jerene Bears, MD;  Location: [facility] ENDOSCOPY;  Service: Gastroenterology;  Laterality: N/A;    Current Medications: Current Meds  Medication Sig   amLODipine (NORVASC) 10 MG tablet Take 1 tablet (10 mg total) by mouth daily.   Blood Glucose Monitoring Suppl (ACCU-CHEK AVIVA CONNECT) w/Device KIT 1 Device by Does not apply route daily.   carvedilol (COREG) 25 MG tablet Take 1 tablet (25 mg total) by mouth 2 (two) times daily with a meal.   chlorthalidone (HYGROTON) 25 MG tablet Take 25 mg by mouth daily.   cholecalciferol (VITAMIN D3) 25 MCG (1000 UNIT) tablet  Take 1,000 Units by mouth daily.   cloNIDine (CATAPRES) 0.1 MG tablet Take 1 tablet (0.1 mg total) by mouth 2 (two) times daily.   doxazosin (CARDURA) 8 MG tablet Take 1 tablet (8 mg total) by mouth 2 (two) times daily.   hydrALAZINE (APRESOLINE) 50 MG tablet TAKE 1 TABLET BY MOUTH THREE TIMES DAILY   pantoprazole (PROTONIX) 40 MG tablet Take 1 tablet (40 mg  total) by mouth 2 (two) times daily before a meal. Twice daily before first and last meal of the day x8 weeks, and then switch to daily dosing. (Patient taking differently: Take 40 mg by mouth daily. Twice daily before first and last meal of the day x8 weeks, and then switch to daily dosing.)   potassium chloride SA (KLOR-CON M) 20 MEQ tablet Take 1 tablet by mouth once daily   spironolactone (ALDACTONE) 25 MG tablet Take 1 tablet (25 mg total) by mouth daily.   [DISCONTINUED] cloNIDine (CATAPRES - DOSED IN MG/24 HR) 0.1 mg/24hr patch Place 1 patch (0.1 mg total) onto the skin once a week.     Allergies:   Patient has no known allergies.   Social History   Socioeconomic History   Marital status: Married    Spouse name: Not on file   Number of children: 3   Years of education: 12   Highest education level: Not on file  Occupational History   Occupation: Security  Tobacco Use   Smoking status: Never   Smokeless tobacco: Never  Vaping Use   Vaping Use: Never used  Substance and Sexual Activity   Alcohol use: Yes    Comment: Rarely   Drug use: No   Sexual activity: Not on file  Other Topics Concern   Not on file  Social History Narrative   Fun/hobby: Keeping up with children (6, 4, 14)   Social Determinants of Health   Financial Resource Strain: Low Risk  (12/17/2021)   Overall Financial Resource Strain (CARDIA)    Difficulty of Paying Living Expenses: Not hard at all  Food Insecurity: No Food Insecurity (12/17/2021)   Hunger Vital Sign    Worried About Running Out of Food in the Last Year: Never true    Ran Out of Food in the Last Year: Never true  Transportation Needs: No Transportation Needs (12/17/2021)   PRAPARE - Hydrologist (Medical): No    Lack of Transportation (Non-Medical): No  Physical Activity: Inactive (12/17/2021)   Exercise Vital Sign    Days of Exercise per Week: 0 days    Minutes of Exercise per Session: 0 min  Stress: Not on  file  Social Connections: Not on file     Family History: The patient's family history includes Diabetes in his father and mother; Hypertension in his brother, father, maternal grandmother, and mother; Stroke in his father.  ROS:   Please see the history of present illness.    All other systems reviewed and are negative.  EKGs/Labs/Other Studies Reviewed:    Bilateral Renal Artery Dopplers 06/23/2019: Summary:  Largest Aortic Diameter: 1.8 cm     Renal:     Right: Normal size right kidney. No evidence of right renal artery         stenosis. RRV flow present. Normal right Resisitive Index.         The renal parenchyma is hyperechogenic, with similar         characteristics to surrounding tissue, consistent with  medical renal disease.  Left:  Normal size of left kidney. No evidence of left renal artery         stenosis. LRV flow present. Abnormal left Resisitve Index.         The renal parenchyma is hyperechogenic, with similar         characteristics to surrounding tissue, consistent with         medical renal disease.  Mesenteric:  Normal Celiac artery and Superior Mesenteric artery findings. Areas of  limited  visceral study include right kidney size, left kidney size, right  parenchymal  flow and left parenchymal flow.   Echo 06/14/2019:  1. Left ventricular ejection fraction, by visual estimation, is 55 to  60%. The left ventricle has normal function. There is severely increased  left ventricular hypertrophy.   2. Left ventricular diastolic Doppler parameters are consistent with  pseudonormalization pattern of LV diastolic filling.   3. Elevated mean left atrial pressure.   4. Global right ventricle has normal systolic function.The right  ventricular size is normal.   5. Left atrial size was mildly dilated.   6. Right atrial size was normal.   7. The mitral valve is normal in structure. No evidence of mitral valve  regurgitation.   8. The tricuspid valve is  normal in structure. Tricuspid valve  regurgitation was not visualized by color flow Doppler.   9. The aortic valve is tricuspid Aortic valve regurgitation was not  visualized by color flow Doppler.  10. The pulmonic valve was grossly normal. Pulmonic valve regurgitation is not visualized by color flow Doppler.  11. The inferior vena cava is normal in size with greater than 50%  respiratory variability, suggesting right atrial pressure of 3 mmHg.    EKG:  EKG is personally reviewed. 04/17/2022:  EKG was not ordered. 12/17/2021: EKG was not ordered.  Recent Labs: 09/07/2021: Magnesium 2.3 09/11/2021: ALT 34; Hemoglobin 14.4; Platelets 272.0 02/24/2022: BUN 45; Creatinine 3.7; Potassium 4.5; Sodium 135   Recent Lipid Panel    Component Value Date/Time   CHOL 180 12/07/2019 0730   TRIG 95.0 12/07/2019 0730   HDL 36.90 (L) 12/07/2019 0730   CHOLHDL 5 12/07/2019 0730   VLDL 19.0 12/07/2019 0730   LDLCALC 125 (H) 12/07/2019 0730    Physical Exam:    VS:  BP (!) 146/92 (BP Location: Right Arm, Patient Position: Sitting, Cuff Size: Large)   Pulse 75   Ht '5\' 4"'  (1.626 m)   Wt 200 lb 14.4 oz (91.1 kg)   SpO2 99%   BMI 34.48 kg/m  , BMI Body mass index is 34.48 kg/m. GENERAL:  Well appearing HEENT: Pupils equal round and reactive, fundi not visualized, oral mucosa unremarkable NECK:  No jugular venous distention, waveform within normal limits, carotid upstroke brisk and symmetric, no bruits, no thyromegaly LUNGS:  Clear to auscultation bilaterally HEART:  RRR.  PMI not displaced or sustained,S1 and S2 within normal limits, no S3, no S4, no clicks, no rubs, no murmurs ABD:  Flat, positive bowel sounds normal in frequency in pitch, no bruits, no rebound, no guarding, no midline pulsatile mass, no hepatomegaly, no splenomegaly EXT:  2 plus pulses throughout, no edema, no cyanosis no clubbing SKIN:  No rashes no nodules NEURO:  Cranial nerves II through XII grossly intact, motor grossly  intact throughout PSYCH:  Cognitively intact, oriented to person place and time   ASSESSMENT/PLAN:    Resistant hypertension Blood pressure has improved substantially but still above goal.  He is not taking the clonidine patches because they were causing skin irritation.  We will put him on 0.1 mg twice daily of clonidine tablets.  Continue amlodipine, carvedilol, chlorthalidone, hydralazine, and spironolactone.  He continues to follow-up with nephrology regularly.  He will continue in our [place] remote patient monitoring system and follow-up in 2 months.  Screening for Secondary Hypertension:     12/17/2021    2:05 PM  Causes  Drugs/Herbals Screened     - Comments small amount of sodium, no caffeine.  Rare EtOH.  Renovascular HTN Screened     - Comments Negative renal artery Dopplers in 2020  Sleep Apnea Screened     - Comments No symptoms  Thyroid Disease Screened  Hyperaldosteronism Screened     - Comments No adenomas and renal and aldosterone levels were normal.  Pheochromocytoma N/A  Cushing's Syndrome N/A  Hyperparathyroidism Screened  Coarctation of the Aorta Screened     - Comments BP symmetric  Compliance Screened    Relevant Labs/Studies:    Latest Ref Rng & Units 02/24/2022   12:00 AM 09/19/2021    3:49 PM 09/11/2021    8:33 AM  Basic Labs  Sodium 137 - 147 135      136   Potassium 3.5 - 5.1 mEq/L 4.5     4.2  3.3   Creatinine 0.6 - 1.3 3.7      3.37      This result is from an external source.       Latest Ref Rng & Units 06/04/2019   12:33 AM 06/01/2019    2:56 PM  Thyroid   TSH 0.350 - 4.500 uIU/mL 2.362  1.58        Latest Ref Rng & Units 09/06/2021    7:16 AM 06/03/2019    8:00 PM 06/01/2019    2:56 PM  Renin/Aldosterone   Aldosterone 0.0 - 30.0 ng/dL 57.1  19.4  30        Latest Ref Rng & Units 06/03/2019    8:00 PM  Metanephrines/Catecholamines   Metanephrines 0.0 - 88.0 pg/mL 72.4   Normetanephrines  0.0 - 110.1 pg/mL 179.3            06/23/2019    8:56 AM  Renovascular   Renal Artery [place] Completed Yes     Disposition:    FU with APP/PharmD in 2 months.    Medication Adjustments/Labs and Tests Ordered: Current medicines are reviewed at length with the patient today.  Concerns regarding medicines are outlined above.   No orders of the defined types were placed in this encounter.  Meds ordered this encounter  Medications   cloNIDine (CATAPRES) 0.1 MG tablet    Sig: Take 1 tablet (0.1 mg total) by mouth 2 (two) times daily.    Dispense:  180 tablet    Refill:  1    D/C PATCH   I,Mathew Stumpf,acting as a scribe for Skeet Latch, MD.,have documented all relevant documentation on the behalf of Skeet Latch, MD,as directed by  Skeet Latch, MD while in the presence of Skeet Latch, MD.  I, [place] Linsey, MD have reviewed all documentation for this visit.  The documentation of the exam, diagnosis, procedures, and orders on 04/17/2022 are all accurate and complete.   Signed, Skeet Latch, MD  04/17/2022 10:14 AM    [place]

## 2022-04-17 NOTE — Patient Instructions (Addendum)
Medication Instructions:  START CLONIDINE 0.1 MG TWICE A DAY  REMAIN OFF THE PATCH   Labwork: NONE  Testing/Procedures: NONE  Follow-Up: 06/12/2022 8:00 AM WITH CAITLIN W NP   Any Other Special Instructions Will Be Listed Below (If Applicable). CONTINUE TO CHECK YOU BLOOD PRESSURE TWICE A DAY AT HOME

## 2022-04-18 ENCOUNTER — Telehealth: Payer: Self-pay

## 2022-04-18 ENCOUNTER — Ambulatory Visit: Payer: Commercial Managed Care - PPO

## 2022-04-18 DIAGNOSIS — Z0000 Encounter for general adult medical examination without abnormal findings: Secondary | ICD-10-CM

## 2022-04-18 NOTE — Telephone Encounter (Signed)
Called patient as scheduled for health coaching session. Patient forgot about today's appointment and is at another engagement. Patient requested to be rescheduled. Patient has been rescheduled for 8/29 at 12:30pm. Patient will be called at this time.  Ar[place]er Truman Hayward [facility] Guide, Health Coach [address], [address] [place] [address] Telephone: [phone] Email: [email or web address]

## 2022-04-22 ENCOUNTER — Ambulatory Visit: Payer: Commercial Managed Care - PPO | Attending: Cardiovascular Disease

## 2022-04-22 DIAGNOSIS — Z0000 Encounter for general adult medical examination without abnormal findings: Secondary | ICD-10-CM

## 2022-04-22 NOTE — Progress Notes (Unsigned)
Appointment Outcome: Completed, Session #:  [id number]                       Start time: 12:31pm   End time: 12:58pm   Total Mins: 27 minutes  AGREEMENTS SECTION   Overall Goal(s): Improve healthy eating habits Stress management                                               Agreement/Action Steps:  Improve healthy eating habits Implement DASH Diet Continue to eat fresh produce Continue to limit the use of spices Continue to limit fried foods Aim to consume 2,0040mor less of sodium per day Read food labels Continue to limit the use of salt while cooking  Stress management Use support system Continue counseling Practice deep breathing Prayer Implement positive self-talk   Progress Notes:  Patient stated that he continues to eat once a day most days. Patient stated that if he eats a heavy dinner that he doesn't eat breakfast. Patient shared that he continues to do most of the cooking at home and avoids using a lot of spices due to his family not liking spicy food. Patient continues not to add salt to his food when cooking. Patient stated that when he eats away from home while working, he continues to eat the same except for having access to fresh vegetables. Patient mentioned that the fried food he has are [place]. Patient stated that he eats them once a week.   Patient expressed that he has become more conscious of his eating habits and is practicing portion control to help manage his sodium intake as well. Patient shared that his kidney function is stable due to the reduction in his blood pressure. Patient mentioned that he is still concerned about improving his health more and that is a motivator to keep implementing his action steps.   Patient rated his stress 4.5/10. Patent shared that his source of stress is his job. Patient expressed that taking deep breathes and praying are a necessity to help keep him calm while working. Patient stated that he tries to slow down and  think about things and talk positive to himself before responding to others that he has conflict with. Patient shared that he has become more assertive with expressing his concerns and feelings when working.   Patient continues to sit outside and take a moment to himself before going in his home after work to relieve stress from the day. Patient mentioned that he continues to talk to his wife in the evening and it helps him manage his stress. Patient shared that he has not had another session with his counselor due to cancelling his appointment when he had to go out of town for work. Patient stated that since his appointments are more spread out, he has begun to forget them.    Indicators of Success and Accountability: Patient stated that staying consistent with his steps. Readiness: Patient is in the action stage of improving healthy eating habits and managing stress.  Strengths and Supports: Patient has his wife as support. Patient stated that consistency has been his strength.  Challenges and Barriers: Patient expressed no challenges.   Coaching Outcomes: Discussed with patient the idea of setting appointment reminders in his phone to help him be complaint. Patient did not make this an action step  moving forward during the next three months. Patient will call to reschedule his counseling appointment.   Patient will continue to implement his action steps as outlined above over the next three months.   Attempted: Fulfilled - Patient continued to limit the use of spices and not cook with salt, limit fried food consumption, read food labels, and aim to consume 2,035m of sodium.  continued to utilize his support system, practice deep breathing, pray, and implement positive self-talk. Partial - Patient was able to eat fresh produce at home but does not have access when he is out of town for work.  Not met - Patient did not attend his scheduled counseling session.

## 2022-04-30 ENCOUNTER — Other Ambulatory Visit: Payer: Self-pay | Admitting: Family Medicine

## 2022-05-11 ENCOUNTER — Other Ambulatory Visit: Payer: Self-pay | Admitting: Family Medicine

## 2022-05-25 ENCOUNTER — Other Ambulatory Visit (HOSPITAL_BASED_OUTPATIENT_CLINIC_OR_DEPARTMENT_OTHER): Payer: Self-pay | Admitting: Cardiovascular Disease

## 2022-05-26 NOTE — Telephone Encounter (Signed)
Rx(s) sent to pharmacy electronically.  

## 2022-06-10 NOTE — Progress Notes (Unsigned)
Advanced Hypertension Clinic Assessment:    Date:  06/12/2022   ID:  Paul Rivers, DOB May 05, 1981, MRN [id number]  PCP:  Martinique, Betty G, MD  Cardiologist:  Evalina Field, MD  Nephrologist:  Referring MD: Martinique, Betty G, MD   CC: Hypertension  History of Present Illness:    Paul Rivers is a 41 y.o. male with a hx of HTN, DM2, CKDIII, HLD, obesity here to follow up in the Advanced Hypertension Clinic.   ED 08/2021 hypertensive emergency and AKI in setting of stopping all of his BP medications. He stopped his medications due to abominal pain. Abdominal CT unremarkable. Found to have gastric ulcer.  Previously seen by Dr. Audie Box 09/2021 with BP 160/90 despite carvedilol, amlodipine, hydralazine, spironolactone.   Secondary hypertension workup: Abdominal CT 08/2021 no adenoma. Renal doppler 05/2019 normal. He was referred to discuss renal denervation.   Established with [facility] Clinic 12/17/21.  Testing for hyperaldosteronism and pheochromocytoma negative. 11/2021 nephrology changed Chlorthalidone to 25mg  QD and Spironolactone to 50mg  daily. At initial [facility] Clinic started on Doxazosin. Hydralazine not increased at recommendation of nephrology. Pharmacy visit 01/2022 Doxazosin increased. Clonidine also added.   Most recent visit 04/17/22. He had stopped Clonidine patch for 1 week due to skin irritation. He was transitioned to Clonidine 0.1mg  PO BID. Amlodipine, Carvedilol, Chlorthalidone, Hydralazine, and Spironolactone were continued. His remote patient monitoring was continued.  He presents today for follow up. 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 He is doing well since starting his Clonidine 0.1mg  BID. He continues to track his blood pressure in [place] which averages 120's over 80's. He notes that his wife tells him that he snores but has improved since he lost weight. He denies chest  pain/tightness/pressure, SOB, palpitations, lightheadedness, and syncope.   Previous antihypertensives: Clonidine 0.1mg  patch - skin irritation  Past Medical History:  Diagnosis Date   CKD (chronic kidney disease) stage 4, GFR 15-29 ml/min (HCC) 06/04/2019   Diabetes mellitus without complication ([place])    Hyperlipidemia    Hypertension    Prolonged QT interval 09/05/2021    Past Surgical History:  Procedure Laterality Date   BIOPSY  09/07/2021   Procedure: BIOPSY;  Surgeon: Jerene Bears, MD;  Location: [facility] ENDOSCOPY;  Service: Gastroenterology;;   ESOPHAGOGASTRODUODENOSCOPY (EGD) WITH PROPOFOL N/A 09/07/2021   Procedure: ESOPHAGOGASTRODUODENOSCOPY (EGD) WITH PROPOFOL;  Surgeon: Jerene Bears, MD;  Location: [facility] ENDOSCOPY;  Service: Gastroenterology;  Laterality: N/A;    Current Medications: Current Meds  Medication Sig   amLODipine (NORVASC) 10 MG tablet Take 1 tablet (10 mg total) by mouth daily.   carvedilol (COREG) 25 MG tablet Take 1 tablet by mouth twice daily   chlorthalidone (HYGROTON) 25 MG tablet Take 25 mg by mouth daily.   cloNIDine (CATAPRES) 0.1 MG tablet Take 1 tablet (0.1 mg total) by mouth 2 (two) times daily.   doxazosin (CARDURA) 8 MG tablet Take 1 tablet by mouth twice daily   hydrALAZINE (APRESOLINE) 50 MG tablet TAKE 1 TABLET BY MOUTH THREE TIMES DAILY   spironolactone (ALDACTONE) 25 MG tablet Take 1 tablet (25 mg total) by mouth daily.     Allergies:   Patient has no known allergies.   Social History   Socioeconomic History   Marital status: Married    Spouse name: Not on file   Number of children: 3   Years of education: 59  Highest education level: Not on file  Occupational History   Occupation: Security  Tobacco Use   Smoking status: Never   Smokeless tobacco: Never  Vaping Use   Vaping Use: Never used  Substance and Sexual Activity   Alcohol use: Yes    Comment: Rarely   Drug use: No   Sexual activity: Not on file  Other Topics Concern    Not on file  Social History Narrative   Fun/hobby: Keeping up with children (6, 4, 14)   Social Determinants of Health   Financial Resource Strain: Low Risk  (12/17/2021)   Overall Financial Resource Strain (CARDIA)    Difficulty of Paying Living Expenses: Not hard at all  Food Insecurity: No Food Insecurity (12/17/2021)   Hunger Vital Sign    Worried About Running Out of Food in the Last Year: Never true    Ran Out of Food in the Last Year: Never true  Transportation Needs: No Transportation Needs (12/17/2021)   PRAPARE - Hydrologist (Medical): No    Lack of Transportation (Non-Medical): No  Physical Activity: Inactive (12/17/2021)   Exercise Vital Sign    Days of Exercise per Week: 0 days    Minutes of Exercise per Session: 0 min  Stress: Not on file  Social Connections: Not on file     Family History: The patient's family history includes Diabetes in his father and mother; Hypertension in his brother, father, maternal grandmother, and mother; Stroke in his father.  ROS:   Please see the history of present illness.    All other systems reviewed and are negative.  EKGs/Labs/Other Studies Reviewed:    Bilateral Renal Artery Dopplers 06/23/2019: Summary:  Largest Aortic Diameter: 1.8 cm     Renal:     Right: Normal size right kidney. No evidence of right renal artery         stenosis. RRV flow present. Normal right Resisitive Index.         The renal parenchyma is hyperechogenic, with similar         characteristics to surrounding tissue, consistent with         medical renal disease.  Left:  Normal size of left kidney. No evidence of left renal artery         stenosis. LRV flow present. Abnormal left Resisitve Index.         The renal parenchyma is hyperechogenic, with similar         characteristics to surrounding tissue, consistent with         medical renal disease.  Mesenteric:  Normal Celiac artery and Superior Mesenteric artery  findings. Areas of  limited  visceral study include right kidney size, left kidney size, right  parenchymal  flow and left parenchymal flow.    Echo 06/14/2019:  1. Left ventricular ejection fraction, by visual estimation, is 55 to  60%. The left ventricle has normal function. There is severely increased  left ventricular hypertrophy.   2. Left ventricular diastolic Doppler parameters are consistent with  pseudonormalization pattern of LV diastolic filling.   3. Elevated mean left atrial pressure.   4. Global right ventricle has normal systolic function.The right  ventricular size is normal.   5. Left atrial size was mildly dilated.   6. Right atrial size was normal.   7. The mitral valve is normal in structure. No evidence of mitral valve  regurgitation.   8. The tricuspid valve is normal in structure. Tricuspid  valve  regurgitation was not visualized by color flow Doppler.   9. The aortic valve is tricuspid Aortic valve regurgitation was not  visualized by color flow Doppler.  10. The pulmonic valve was grossly normal. Pulmonic valve regurgitation is not visualized by color flow Doppler.  11. The inferior vena cava is normal in size with greater than 50%  respiratory variability, suggesting right atrial pressure of 3 mmHg.       EKG:  EKG is not ordered today.    Recent Labs: 09/07/2021: Magnesium 2.3 09/11/2021: ALT 34; Hemoglobin 14.4; Platelets 272.0 02/24/2022: BUN 45; Creatinine 3.7; Potassium 4.5; Sodium 135   Recent Lipid Panel    Component Value Date/Time   CHOL 180 12/07/2019 0730   TRIG 95.0 12/07/2019 0730   HDL 36.90 (L) 12/07/2019 0730   CHOLHDL 5 12/07/2019 0730   VLDL 19.0 12/07/2019 0730   LDLCALC 125 (H) 12/07/2019 0730    Physical Exam:   VS:  BP 138/87   Pulse 74   Ht 5\' 4"  (1.626 m)   Wt 209 lb 14.4 oz (95.2 kg)   BMI 36.03 kg/m  , BMI Body mass index is 36.03 kg/m. GENERAL:  Well appearing HEENT: Pupils equal round and reactive, fundi not  visualized, oral mucosa unremarkable NECK:  No jugular venous distention, waveform within normal limits, carotid upstroke brisk and symmetric, no bruits, no thyromegaly LYMPHATICS:  No cervical adenopathy LUNGS:  Clear to auscultation bilaterally HEART:  RRR.  PMI not displaced or sustained,S1 and S2 within normal limits, no S3, no S4, no clicks, no rubs, murmurs ABD:  Flat, positive bowel sounds normal in frequency in pitch, no bruits, no rebound, no guarding, no midline pulsatile mass, no hepatomegaly, no splenomegaly EXT:  2 plus pulses throughout, no edema, no cyanosis no clubbing SKIN:  No rashes no nodules NEURO:  Cranial nerves II through XII grossly intact, motor grossly intact throughout PSYCH:  Cognitively intact, oriented to person place and time   ASSESSMENT/PLAN:    Resistant hypertension - His blood pressure has been well controlled per remote patient monitoring. His average blood pressure is 120's/80's. Has stopped potassium supplements since last visit per nephrology recommendations. Will order a BMP today to reassess potassium. Continue current antihypertensive regimen: Amlodipine 10mg  QD, Chlorthalidone 25mg  QD, Carvedilol 25mg  BID, Clonidine 0.1mg  BID, Doxazosin 8mg  QD, Hydralazine 50mg  TID, Spironolactone 25mg  QD. Secondary hypertension workup has been unremarkable. As BP at goal and tolerating meds, will end remote patient monitoring.   GERD - Well controlled. Has stop taking his Protonix without recurrent reflux.  CKD - Careful titration of diuretic and antihypertensive.  BMP today  for monitoring. Follows with nephrology.   Snoring - His wife tells him that he snores but this has been improved since his weight loss. At home sleep study was offered given snoring and family hx of OSA. He will talk to his wife about testing and reach out to the office if he chooses to be screened.     Screening for Secondary Hypertension:     12/17/2021    2:05 PM  Causes   Drugs/Herbals Screened     - Comments small amount of sodium, no caffeine.  Rare EtOH.  Renovascular HTN Screened     - Comments Negative renal artery Dopplers in 2020  Sleep Apnea Screened     - Comments No symptoms  Thyroid Disease Screened  Hyperaldosteronism Screened     - Comments No adenomas and renal and aldosterone levels were normal.  Pheochromocytoma N/A  Cushing's Syndrome N/A  Hyperparathyroidism Screened  Coarctation of the Aorta Screened     - Comments BP symmetric  Compliance Screened    Relevant Labs/Studies:    Latest Ref Rng & Units 02/24/2022   12:00 AM 09/19/2021    3:49 PM 09/11/2021    8:33 AM  Basic Labs  Sodium 137 - 147 135      136   Potassium 3.5 - 5.1 mEq/L 4.5     4.2  3.3   Creatinine 0.6 - 1.3 3.7      3.37      This result is from an external source.       Latest Ref Rng & Units 06/04/2019   12:33 AM 06/01/2019    2:56 PM  Thyroid   TSH 0.350 - 4.500 uIU/mL 2.362  1.58        Latest Ref Rng & Units 09/06/2021    7:16 AM 06/03/2019    8:00 PM 06/01/2019    2:56 PM  Renin/Aldosterone   Aldosterone 0.0 - 30.0 ng/dL 57.1  19.4  30        Latest Ref Rng & Units 06/03/2019    8:00 PM  Metanephrines/Catecholamines   Metanephrines 0.0 - 88.0 pg/mL 72.4   Normetanephrines  0.0 - 110.1 pg/mL 179.3           06/23/2019    8:56 AM  Renovascular   Renal Artery [place] Completed Yes     Disposition:    FU with Dr. Audie Box in 6 months    Medication Adjustments/Labs and Tests Ordered: Current medicines are reviewed at length with the patient today.  Concerns regarding medicines are outlined above.  Orders Placed This Encounter  Procedures   Basic metabolic panel   Cantril's Ladder Assessment   No orders of the defined types were placed in this encounter.    Signed, Loel Dubonnet, NP  06/12/2022 1:18 PM    [facility]

## 2022-06-12 ENCOUNTER — Encounter (HOSPITAL_BASED_OUTPATIENT_CLINIC_OR_DEPARTMENT_OTHER): Payer: Self-pay | Admitting: Family

## 2022-06-12 ENCOUNTER — Ambulatory Visit (INDEPENDENT_AMBULATORY_CARE_PROVIDER_SITE_OTHER): Payer: Commercial Managed Care - PPO | Admitting: Family

## 2022-06-12 VITALS — BP 138/87 | HR 74 | Ht 64.0 in | Wt 209.9 lb

## 2022-06-12 DIAGNOSIS — I1A0 Resistant hypertension: Secondary | ICD-10-CM

## 2022-06-12 DIAGNOSIS — R0683 Snoring: Secondary | ICD-10-CM

## 2022-06-12 DIAGNOSIS — Z006 Encounter for examination for normal comparison and control in clinical research program: Secondary | ICD-10-CM

## 2022-06-12 NOTE — Patient Instructions (Signed)
Medication Instructions:  Your Physician recommend you continue on your current medication as directed.    *If you need a refill on your cardiac medications before your next appointment, please call your pharmacy*   Lab Work: Your physician recommends that you return for lab work today- BMP   If you have labs (blood work) drawn today and your tests are completely normal, you will receive your results only by: MyChart Message (if you have MyChart) OR A paper copy in the mail If you have any lab test that is abnormal or we need to change your treatment, we will call you to review the results.   Testing/Procedures: Let us know if you decide you want to do a sleep study.    Follow-Up: At [facility], you and your health needs are our priority.  As part of our continuing mission to provide you with exceptional heart care, we have created designated Provider Care Teams.  These Care Teams include your primary Cardiologist (physician) and Advanced Practice Providers (APPs -  Physician Assistants and Nurse Practitioners) who all work together to provide you with the care you need, when you need it.  We recommend signing up for the patient portal called "MyChart".  Sign up information is provided on this After Visit Summary.  MyChart is used to connect with patients for Virtual Visits (Telemedicine).  Patients are able to view lab/test results, encounter notes, upcoming appointments, etc.  Non-urgent messages can be sent to your provider as well.   To learn more about what you can do with MyChart, go to [email or web address].    Your next appointment:   Follow up in 6 months with Dr. Audie Box. We do not have this schedule yet so we will send you a letter when it is time to schedule.   Other Instructions Heart Healthy Diet Recommendations: A low-salt diet is recommended. Meats should be grilled, baked, or boiled. Avoid fried foods. Focus on lean protein sources like fish or chicken with  vegetables and fruits. The American Heart Association is a Microbiologist!  American Heart Association Diet and Lifeystyle Recommendations   Exercise recommendations: The American Heart Association recommends 150 minutes of moderate intensity exercise weekly. Try 30 minutes of moderate intensity exercise 4-5 times per week. This could include walking, jogging, or swimming.   Important Information About Sugar

## 2022-06-12 NOTE — Research (Signed)
I saw pt today after Paul Montana NP follow up visit. Pt is in Dr. Blenda Mounts Virtual Care HTN Study. Pt filled out research survey. Pt was enrolled in Group 2. Pt has successfully reached his blood pressure goal and completed the Virtual Care HTN Study.

## 2022-06-13 LAB — BASIC METABOLIC PANEL
BUN/Creatinine Ratio: 12 (ref 9–20)
BUN: 40 mg/dL — ABNORMAL HIGH (ref 6–24)
CO2: 18 mmol/L — ABNORMAL LOW (ref 20–29)
Calcium: 9.3 mg/dL (ref 8.7–10.2)
Chloride: 104 mmol/L (ref 96–106)
Creatinine, Ser: 3.21 mg/dL — ABNORMAL HIGH (ref 0.76–1.27)
Glucose: 112 mg/dL — ABNORMAL HIGH (ref 70–99)
Potassium: 4.5 mmol/L (ref 3.5–5.2)
Sodium: 141 mmol/L (ref 134–144)
eGFR: 24 mL/min/{1.73_m2} — ABNORMAL LOW (ref >59)

## 2022-07-23 ENCOUNTER — Ambulatory Visit: Payer: Commercial Managed Care - PPO

## 2022-07-23 DIAGNOSIS — Z0000 Encounter for general adult medical examination without abnormal findings: Secondary | ICD-10-CM

## 2022-07-23 NOTE — Progress Notes (Signed)
Appointment Outcome: Completed, Session #: 57-month f/u                        Start time: 12:29pm   End time: 12:51pm   Total Mins: 22 minutes  AGREEMENTS SECTION   Overall Goal(s): Improve healthy eating habits Stress management  Agreement/Action Steps:  Improve healthy eating habits Implement DASH Diet Continue to eat fresh produce Continue to limit the use of spices Continue to limit fried foods Aim to consume 2,000mg  or less of sodium per day Read food labels Continue to limit the use of salt while cooking  Stress management Use support system Continue counseling Practice deep breathing Prayer Implement positive self-talk   Progress Notes:  Patient stated that his stress level is approximately 3/10. Patient shared that he wouldn't call himself stressed but is dealing with a stressor that he is working himself through by talking with someone from his support system. Patient mentioned that the situation he is facing, he finds that talking to his brother is helpful to process things compared to not opening about his feelings previously. Patient shared that he is also implementing positive self-talk to help cope with his current situation.  Patient was asked if he was communicating with his counselor about his mental state around this situation since he stated that he don't know what to feel or cannot put his feelings into words. Patient replied that he has not rescheduled a visit with his counselor since the last visit was cancelled. Patient mentioned that he didn't feel comfortable attending counseling and expressed uncertainty about continuing counseling.  Patient stated that he run into stressful moments at work, and explained that instead of getting upset, he practices deep breathing and pray. Patient stated that this helps him to keep his mind clear. Patient shared that he still talks with his wife in the evenings after work about various things that helps him manage his stress  better.   Patient explained that he only has challenges with his eating habits when his is working. Patient stated that he still limits his fried food consumption to once or twice a month or longer. Patient mentioned that he ate fried chicken while working one day but did consider eating grilled chicken instead. Patient stated that he still chooses fried chicken because of him not having any in a long time and has not eating any over the past couple of weeks. Patient stated that he doesn't eat fresh produce when he does choose to eat out. Patient shared that at home he doesn't fry food and have access to fresh produce.   Patient mentioned that he continues to limit his use of spices and salt when preparing meals. Patient stated that he cooked chili and used a little hot sauce for spice but did not overuse it. Patient stated that his brother and sister-in-law cooked for Thanksgiving and did not use a lot of salt when preparing their meal. Patient reported that he continues to read food labels to ensure that he knows how much sodium he is consuming to limit his sodium intake to 2,000mg  or less per day. Patient stated that he also limits his consumption of sodium by typically eating dinner only and occasionally lunch when working.   Patient stated that he is motivated to manage his stress and eating healthier because he was able to lower his blood pressure and wants to continue to control it.    Indicators of Success and Accountability:  Patient stated  that having the mindset to implement his action steps to manage his stress and improve his eating behaviors has contributed to him being able to control his blood pressure.  Readiness: Patient is in the action stage of stress management and improving healthy eating habits.  Strengths and Supports: Patient is being supported by his wife and brother. Patient stated that holding himself accountable for his health has been his strength.  Challenges and Barriers:  Patient stated that issues at work can be a challenge to managing stress.   Coaching Outcomes: Patient has determined that counseling is not a good fit for him at this time and will continue to utilize his support system to aid in stress management now that his stress level has lowered.   Patient will continue to implement all other action steps as outlined above over the next 6 months. Patient will aim to continue to implement action steps to work toward the maintenance stage of stress management and eating healthy.    Attempted: Fulfilled - Patient has continued eat fresh produce, limit the use of spices, limit fried foods, read food labels to manage sodium consumption, and limit the use of salt when cooking. Patient continues to use support system, practice deep breathing, prayer, and implement positive self-talk.    Not attempted: Dropped/Revised - Patient determined that counseling is not a good fit for him at this time.

## 2022-07-27 ENCOUNTER — Other Ambulatory Visit: Payer: Self-pay | Admitting: Family Medicine

## 2022-08-06 ENCOUNTER — Other Ambulatory Visit: Payer: Self-pay | Admitting: Family Medicine

## 2022-09-04 ENCOUNTER — Encounter (HOSPITAL_BASED_OUTPATIENT_CLINIC_OR_DEPARTMENT_OTHER): Payer: Self-pay

## 2022-10-01 ENCOUNTER — Other Ambulatory Visit (HOSPITAL_BASED_OUTPATIENT_CLINIC_OR_DEPARTMENT_OTHER): Payer: Self-pay | Admitting: Cardiovascular Disease

## 2022-10-01 NOTE — Telephone Encounter (Signed)
Rx(s) sent to pharmacy electronically.  

## 2022-10-07 ENCOUNTER — Other Ambulatory Visit (HOSPITAL_BASED_OUTPATIENT_CLINIC_OR_DEPARTMENT_OTHER): Payer: Self-pay | Admitting: Cardiovascular Disease

## 2022-10-08 NOTE — Telephone Encounter (Signed)
Rx(s) sent to pharmacy electronically.  

## 2022-10-20 ENCOUNTER — Other Ambulatory Visit: Payer: Self-pay | Admitting: Family Medicine

## 2022-11-04 ENCOUNTER — Other Ambulatory Visit: Payer: Self-pay | Admitting: Family Medicine

## 2022-11-15 ENCOUNTER — Other Ambulatory Visit: Payer: Self-pay | Admitting: Family Medicine

## 2022-11-29 NOTE — Progress Notes (Unsigned)
HPI:  No chief complaint on file.   [name] is a 42 y.o. male, who is here today for chronic disease management. Last seen on ***  Hypertension:  Medications:*** BP readings at home:*** Side effects:***  Negative for unusual or severe headache, visual changes, exertional chest pain, dyspnea,  focal weakness, or edema.  Lab Results  Component Value Date   CREATININE 3.21 (H) 06/12/2022   BUN 40 (H) 06/12/2022   NA 141 06/12/2022   K 4.5 06/12/2022   CL 104 06/12/2022   CO2 18 (L) 06/12/2022    Diabetes Mellitus II:  - Checking BG at home: *** - Medications: *** - Compliance: *** - Diet: *** - Exercise: *** - eye exam: *** - foot exam: ***  - Negative for symptoms of hypoglycemia, polyuria, polydipsia, numbness extremities, foot ulcers/trauma  Lab Results  Component Value Date   HGBA1C 6.3 (H) 09/05/2021   Lab Results  Component Value Date   MICROALBUR 6.0 (H) 09/02/2019    Hyperlipidemia: Currently on *** Following a low fat diet: ***. Side effects from medication:*** Lab Results  Component Value Date   CHOL 180 12/07/2019   HDL 36.90 (L) 12/07/2019   LDLCALC 125 (H) 12/07/2019   TRIG 95.0 12/07/2019   CHOLHDL 5 12/07/2019    Review of Systems See other pertinent positives and negatives in HPI.  Current Outpatient Medications on File Prior to Visit  Medication Sig Dispense Refill   amLODipine (NORVASC) 10 MG tablet Take 1 tablet (10 mg total) by mouth daily. 90 tablet 3   carvedilol (COREG) 25 MG tablet Take 1 tablet by mouth twice daily 180 tablet 0   chlorthalidone (HYGROTON) 25 MG tablet Take 25 mg by mouth daily.     cloNIDine (CATAPRES) 0.1 MG tablet Take 1 tablet by mouth twice daily 180 tablet 1   doxazosin (CARDURA) 8 MG tablet Take 1 tablet by mouth twice daily 90 tablet 2   hydrALAZINE (APRESOLINE) 50 MG tablet TAKE 1 TABLET BY MOUTH THREE TIMES DAILY ** DUE FOR FOLLOW UP/PHYSICAL** 90 tablet 0   spironolactone (ALDACTONE)  25 MG tablet Take 1 tablet (25 mg total) by mouth daily. 30 tablet 2   No current facility-administered medications on file prior to visit.    Past Medical History:  Diagnosis Date   CKD (chronic kidney disease) stage 4, GFR 15-29 ml/min (HCC) 06/04/2019   Diabetes mellitus without complication (HCC)    Hyperlipidemia    Hypertension    Prolonged QT interval 09/05/2021    No Known Allergies  Social History   Socioeconomic History   Marital status: Married    Spouse name: Not on file   Number of children: 3   Years of education: 12   Highest education level: Not on file  Occupational History   Occupation: Security  Tobacco Use   Smoking status: Never   Smokeless tobacco: Never  Vaping Use   Vaping Use: Never used  Substance and Sexual Activity   Alcohol use: Yes    Comment: Rarely   Drug use: No   Sexual activity: Not on file  Other Topics Concern   Not on file  Social History Narrative   Fun/hobby: Keeping up with children (6, 4, 14)   Social Determinants of Health   Financial Resource Strain: Low Risk  (12/17/2021)   Overall Financial Resource Strain (CARDIA)    Difficulty of Paying Living Expenses: Not hard at all  Food Insecurity: No Food Insecurity (12/17/2021)  Hunger Vital Sign    Worried About Running Out of Food in the Last Year: Never true    Ran Out of Food in the Last Year: Never true  Transportation Needs: No Transportation Needs (12/17/2021)   PRAPARE - Administrator, Civil Service (Medical): No    Lack of Transportation (Non-Medical): No  Physical Activity: Inactive (12/17/2021)   Exercise Vital Sign    Days of Exercise per Week: 0 days    Minutes of Exercise per Session: 0 min  Stress: Not on file  Social Connections: Not on file    There were no vitals filed for this visit.  There is no height or weight on file to calculate BMI.  Physical Exam  ASSESSMENT AND PLAN:  There are no diagnoses linked to this encounter.  No  orders of the defined types were placed in this encounter.   No problem-specific Assessment & Plan notes found for this encounter.   No follow-ups on file.   Kwadwo Taras [place], MD [facility]. [place] office.

## 2022-12-01 ENCOUNTER — Ambulatory Visit (INDEPENDENT_AMBULATORY_CARE_PROVIDER_SITE_OTHER): Payer: Commercial Managed Care - PPO | Admitting: Family Medicine

## 2022-12-01 ENCOUNTER — Encounter: Payer: Self-pay | Admitting: Family Medicine

## 2022-12-01 VITALS — BP 140/95 | HR 88 | Temp 97.7°F | Resp 12 | Ht 64.0 in | Wt 219.4 lb

## 2022-12-01 DIAGNOSIS — E785 Hyperlipidemia, unspecified: Secondary | ICD-10-CM | POA: Diagnosis not present

## 2022-12-01 DIAGNOSIS — R0683 Snoring: Secondary | ICD-10-CM | POA: Diagnosis not present

## 2022-12-01 DIAGNOSIS — E559 Vitamin D deficiency, unspecified: Secondary | ICD-10-CM

## 2022-12-01 DIAGNOSIS — E1169 Type 2 diabetes mellitus with other specified complication: Secondary | ICD-10-CM | POA: Diagnosis not present

## 2022-12-01 DIAGNOSIS — N184 Chronic kidney disease, stage 4 (severe): Secondary | ICD-10-CM

## 2022-12-01 DIAGNOSIS — I1A0 Resistant hypertension: Secondary | ICD-10-CM

## 2022-12-01 LAB — LIPID PANEL
Cholesterol: 199 mg/dL (ref 0–200)
HDL: 36.9 mg/dL — ABNORMAL LOW (ref >39.00)
LDL Cholesterol: 151 mg/dL — ABNORMAL HIGH (ref 0–99)
NonHDL: 162.22
Total CHOL/HDL Ratio: 5
Triglycerides: 58 mg/dL (ref 0.0–149.0)
VLDL: 11.6 mg/dL (ref 0.0–40.0)

## 2022-12-01 LAB — HEPATIC FUNCTION PANEL
ALT: 17 U/L (ref 0–53)
AST: 20 U/L (ref 0–37)
Albumin: 4.7 g/dL (ref 3.5–5.2)
Alkaline Phosphatase: 48 U/L (ref 39–117)
Bilirubin, Direct: 0 mg/dL (ref 0.0–0.3)
Total Bilirubin: 0.3 mg/dL (ref 0.2–1.2)
Total Protein: 7.7 g/dL (ref 6.0–8.3)

## 2022-12-01 LAB — HEMOGLOBIN A1C: Hgb A1c MFr Bld: 6.3 % (ref 4.6–6.5)

## 2022-12-01 MED ORDER — AMLODIPINE BESYLATE 10 MG PO TABS: 10.0000 mg | ORAL_TABLET | Freq: Every day | ORAL | 3 refills | Status: DC

## 2022-12-01 MED ORDER — HYDRALAZINE HCL 50 MG PO TABS: 75.0000 mg | ORAL_TABLET | Freq: Three times a day (TID) | ORAL | 1 refills | Status: DC

## 2022-12-01 NOTE — Assessment & Plan Note (Addendum)
BP has improved but still not at goal. Home BP readings 130-160's/90's. Today Hydralazine dose increased from 50 mg tid to 75 mg tid. Continue Carvedilol 25 mg tid,Doxazosin 8 mg daily, Amlodipine 10 mg daily, Clonidine 0.1 mg bid,Chlorthalidone 25 mg daily,and Spironolactone 25 mg daily.  We discussed possible complications of poorly controlled HTN. He has an appt with cardiologist in 12/2022 and following with nephrologist q 6 months. Continue monitoring BP at home and low salt diet. Clearly instructed about warning signs.

## 2022-12-01 NOTE — Assessment & Plan Note (Signed)
Following with nephrologist every 6 months. Continue low salt diet, adequate hydration, BP and glucose control. Avoidance of NSAID's.

## 2022-12-01 NOTE — Assessment & Plan Note (Signed)
Problem seems to be stable. Not witness sleep apnea but give his hx of resistant HTN I think it is appropriate to evaluate for OSA, referral for sleep study placed.

## 2022-12-01 NOTE — Assessment & Plan Note (Signed)
HgA1C has been at goal. °Continue non pharmacologic treatment. °Annual eye exam, periodic dental and foot care recommended. °F/U in 5-6 months ° °

## 2022-12-01 NOTE — Assessment & Plan Note (Signed)
He is not on statin medication. Last LDL 125 in 11/2019. Further recommendations according to FLP result.

## 2022-12-01 NOTE — Patient Instructions (Addendum)
A few things to remember from today's visit:  Type 2 diabetes mellitus with other specified complication, without long-term current use of insulin - Plan: Hemoglobin A1c  CKD (chronic kidney disease) stage 4, GFR 15-29 ml/min  Resistant hypertension - Plan: hydrALAZINE (APRESOLINE) 50 MG tablet, Ambulatory referral to Sleep Studies  Hyperlipidemia associated with type 2 diabetes mellitus - Plan: Lipid panel  Snoring - Plan: Ambulatory referral to Sleep Studies  Hydralazine increased to 1.5 tab 3 times daily. Rest unchanged. Sleep study will be arranged. Please arrange an eye exam.  If you need refills for medications you take chronically, please call your pharmacy. Do not use My Chart to request refills or for acute issues that need immediate attention. If you send a my chart message, it may take a few days to be addressed, specially if I am not in the office.  Please be sure medication list is accurate. If a new problem present, please set up appointment sooner than planned today.

## 2022-12-05 ENCOUNTER — Other Ambulatory Visit: Payer: Self-pay | Admitting: Family Medicine

## 2022-12-05 ENCOUNTER — Other Ambulatory Visit: Payer: Self-pay

## 2022-12-05 MED ORDER — ROSUVASTATIN CALCIUM 10 MG PO TABS: 10.0000 mg | ORAL_TABLET | Freq: Every day | ORAL | 1 refills | Status: DC

## 2023-01-20 ENCOUNTER — Telehealth: Payer: Self-pay | Admitting: Cardiovascular Disease

## 2023-01-20 NOTE — Telephone Encounter (Signed)
Hi dear,it looks like this patient needs to reschedule with you! I don't know enough about your schedule to go changing things!

## 2023-01-20 NOTE — Telephone Encounter (Signed)
Returned patient's call regarding health coaching appointment on 5/29 at 12:30pm. Patient stated that he thought it was an in-person appointment and needed to reschedule due to a conflict. Confirmed with patient that it is a telephone appointment and patient verbally expressed that he would be able to keep the appointment as is.    Renaee Munda, MS, ERHD, [facility]  Care Guide, Health & Wellness Coach [address] [place] [address] Telephone: [phone] Email: [email or web address]

## 2023-01-20 NOTE — Telephone Encounter (Signed)
Patient is scheduled for 5/29 health coaching nurse visit and he has a conflict. He would like to reschedule.

## 2023-01-21 ENCOUNTER — Ambulatory Visit: Payer: Commercial Managed Care - PPO

## 2023-01-21 DIAGNOSIS — Z0000 Encounter for general adult medical examination without abnormal findings: Secondary | ICD-10-CM

## 2023-01-21 NOTE — Progress Notes (Signed)
Appointment Outcome: Completed, Session #: 49-month f/u (final)                         Start time: 2:30pm   End time: 2:56pm   Total Mins: 26 minutes  AGREEMENTS SECTION   Overall Goal(s): Improve healthy eating habits Stress management   Agreement/Action Steps:  Improve healthy eating habits Implement DASH Diet Continue to eat fresh produce Continue to limit the use of spices Continue to limit fried foods Aim to consume 2,000mg  or less of sodium per day Read food labels Continue to limit the use of salt while cooking   Stress management Use support system Continue counseling Practice deep breathing Prayer Implement positive self-talk    Progress Notes:  Patient stated that he has not had challenges to maintaining his action steps to improve his healthy eating habits. Patient reported that his has reduced fried food to a minimum and typically only have fries when he does. Patient mentioned that he ordered fries he ask for no salt due to the amount of salt that restaurants add to food and add a small amount himself to aid in managing his sodium consumption.  Patient stated that he continues to read food labels to make healthy food choices. Patient reported that he does not consume 2,000mg  of sodium in a day, keeping his salt use during cooking to a minimum, eating recommended portion sizes, and choosing fresh produce over canned goods. Patient shared that he only eats spicy food on occasions, which is no longer a challenge to reduce in his diet because of having a lower tolerance from not eating it as he did before.   Patient mentioned that he consumes a lot of fresh vegetables, while his go to fruits are grapes and apples. Patient stated that he consumes a lot of water while working to stay hydrated as well. Patient reported that he has included walking to aid in maintaining his weight within the past 6 months, but need to resume his regular schedule because he feels like he has  gained weight.   Patient stated that he also practices portion control when he goes out to eat. Patient gave an example of previously choosing the largest steak on the menu and go for a much smaller portion size steak. Patient stated that making these health behavior changes has helped him to lower his blood pressure. Patient mentioned that he thinks about the improvement in his health and stays motivated to implement these health behavior changes because he doesn't want his health to worsen.   Patient rated his stress level a 4 out of 10 (10 being the highest) but emphasized that he really couldn't put a number on his stress level because he has not been stressed anymore. Patient shared that he continues to communicate with his wife and others about his feelings, discuss solutions with supervisor when work issues arise, and pray throughout the day. Patient stated that he has found a church home, which he has found to be helpful in managing his stress.   Patient shared that he continues to decompress from work while sitting in his vehicle before going into the house. Patient stated that he spends time alone in the morning to pray before leaving for work, and at other times to decompress. Patient shared that walking has helped him reduce his stress also by giving him a chance to take a step back and then revisits the issue with a clear mind or is  able to let things go.   Patient stated that if he is overwhelmed, he typically takes a moment to step outside to do his breathing to calm himself. Patient practices positive self-talk and encourage himself in moments but feels that it is a work in progress when he is frustrated. Patient stated when he is frustrated, he speaks to himself, but still may have a moment before he is able to relax.   Patient expressed that health coaching has been a positive experienced. Patient mentioned that the most important thing he has learned from health coaching is the benefit  of expressing himself to others and talking about things instead of keeping things to himself. Patient stated that he has become more conscious of the food choices that he is making and disciplined in implementing healthy habits.   Patient is now challenging himself to not eating meat for 2-weeks and sticking to a vegetarian diet. Patient shared that he has been sharing with a co-worker the changes that he has made and encourages him to eat healthier. Patient stated that although every day can be a challenge with improving your health, he reminds himself that he has a lot to live for and is motivated to do so for his children.     Indicators of Success and Accountability: Patient has become more conscious of the food choices he is making and being able to maintain all his action steps over the past 6 months.   Readiness: Patient is in the maintenance stage of healthy eating habits and stress management.   Strengths and Supports: Patient is being supported by his wife. Patient is relying on being disciplined and more self-aware of his health behaviors.  Challenges and Barriers: Patient stated that every day is a challenge dealing with health issues, otherwise, there are no challenges to implementing his action steps routinely.     Coaching Outcomes: Patient has completed the health coaching program successfully by maintaining his action steps to improve his healthy eating habits and managing stress.   Patient has been discharged from the health coaching program as of 01/21/23.     Attempted: Fulfilled - Patient completed the 32-month agreement in full and was able to meet the challenge.

## 2023-02-02 ENCOUNTER — Telehealth: Payer: Self-pay

## 2023-02-02 DIAGNOSIS — E1169 Type 2 diabetes mellitus with other specified complication: Secondary | ICD-10-CM

## 2023-02-02 NOTE — Telephone Encounter (Signed)
-----   Message from Kathreen Devoid, CMA sent at 12/05/2022 12:36 PM EDT ----- LFT & lipid in 8 weeks.

## 2023-02-09 ENCOUNTER — Other Ambulatory Visit (INDEPENDENT_AMBULATORY_CARE_PROVIDER_SITE_OTHER): Payer: Commercial Managed Care - PPO

## 2023-02-09 DIAGNOSIS — E785 Hyperlipidemia, unspecified: Secondary | ICD-10-CM

## 2023-02-09 DIAGNOSIS — E1169 Type 2 diabetes mellitus with other specified complication: Secondary | ICD-10-CM

## 2023-02-09 LAB — HEPATIC FUNCTION PANEL
ALT: 24 U/L (ref 0–53)
AST: 22 U/L (ref 0–37)
Albumin: 4.5 g/dL (ref 3.5–5.2)
Alkaline Phosphatase: 43 U/L (ref 39–117)
Bilirubin, Direct: 0 mg/dL (ref 0.0–0.3)
Total Bilirubin: 0.3 mg/dL (ref 0.2–1.2)
Total Protein: 7.6 g/dL (ref 6.0–8.3)

## 2023-02-09 LAB — LIPID PANEL
Cholesterol: 115 mg/dL (ref 0–200)
HDL: 34.8 mg/dL — ABNORMAL LOW (ref >39.00)
LDL Cholesterol: 62 mg/dL (ref 0–99)
NonHDL: 79.72
Total CHOL/HDL Ratio: 3
Triglycerides: 90 mg/dL (ref 0.0–149.0)
VLDL: 18 mg/dL (ref 0.0–40.0)

## 2023-02-22 ENCOUNTER — Other Ambulatory Visit (HOSPITAL_BASED_OUTPATIENT_CLINIC_OR_DEPARTMENT_OTHER): Payer: Self-pay | Admitting: Cardiovascular Disease

## 2023-02-23 NOTE — Telephone Encounter (Signed)
Rx(s) sent to pharmacy electronically.  

## 2023-03-03 ENCOUNTER — Other Ambulatory Visit (HOSPITAL_BASED_OUTPATIENT_CLINIC_OR_DEPARTMENT_OTHER): Payer: Self-pay | Admitting: Cardiovascular Disease

## 2023-03-03 NOTE — Telephone Encounter (Signed)
Rx(s) sent to pharmacy electronically.  

## 2023-03-11 LAB — LAB REPORT - SCANNED
A1c: 6
Albumin, Urine POC: 148
Creatinine, POC: 85 mg/dL
Microalb Creat Ratio: 174
eGFR: 30

## 2023-04-28 ENCOUNTER — Encounter: Payer: Self-pay | Admitting: Family Medicine

## 2023-04-28 ENCOUNTER — Ambulatory Visit (INDEPENDENT_AMBULATORY_CARE_PROVIDER_SITE_OTHER): Payer: Commercial Managed Care - PPO | Admitting: Family Medicine

## 2023-04-28 VITALS — BP 124/86 | HR 67 | Temp 97.7°F | Ht 64.0 in | Wt 218.8 lb

## 2023-04-28 DIAGNOSIS — I1A0 Resistant hypertension: Secondary | ICD-10-CM | POA: Diagnosis not present

## 2023-04-28 DIAGNOSIS — E1169 Type 2 diabetes mellitus with other specified complication: Secondary | ICD-10-CM | POA: Diagnosis not present

## 2023-04-28 DIAGNOSIS — E1122 Type 2 diabetes mellitus with diabetic chronic kidney disease: Secondary | ICD-10-CM

## 2023-04-28 DIAGNOSIS — E785 Hyperlipidemia, unspecified: Secondary | ICD-10-CM

## 2023-04-28 DIAGNOSIS — N184 Chronic kidney disease, stage 4 (severe): Secondary | ICD-10-CM | POA: Diagnosis not present

## 2023-04-28 LAB — POCT GLYCOSYLATED HEMOGLOBIN (HGB A1C): Hemoglobin A1C: 6.1 % — AB (ref 4.0–5.6)

## 2023-04-28 NOTE — Assessment & Plan Note (Signed)
Otherwise BP is well-controlled, reporting similar readings at home. BP has improved but still not at goal. Home BP readings 130-160's/90's. Today Hydralazine dose increased from 50 mg tid to 75 mg tid. Continue carvedilol 25 mg twice daily, doxazosin 8 mg 2 tablets daily, amlodipine 10 mg daily, hydralazine 50 mg 1.5 tablet 3 times per day, clonidine 0.1 mg twice daily, chlorthalidone 25 mg daily, and same dose of spironolactone (not sure if he is taking 25 mg or 50 mg daily). Continue monitoring BP at home and following low salt diet. Follow-up in 6 months.

## 2023-04-28 NOTE — Patient Instructions (Addendum)
A few things to remember from today's visit:  Type 2 diabetes mellitus with other specified complication, without long-term current use of insulin (HCC) - Plan: POC HgB A1c  Resistant hypertension  Hyperlipidemia associated with type 2 diabetes mellitus (HCC) No changes today.  If you need refills for medications you take chronically, please call your pharmacy. Do not use My Chart to request refills or for acute issues that need immediate attention. If you send a my chart message, it may take a few days to be addressed, specially if I am not in the office.  Please be sure medication list is accurate. If a new problem present, please set up appointment sooner than planned today.

## 2023-04-28 NOTE — Assessment & Plan Note (Addendum)
Problem is adequately controlled. We discussed benefits of some hypoglycemic agents, including GLP-1 agonist and SGLT-2 inh.  He is not interested in adding medication at this time, so continue nonpharmacologic treatment. Regular exercise and healthy diet with avoidance of added sugar food intake is an important part of treatment and recommended. Annual eye exam, periodic dental and foot care recommended. F/U in 5-6 months

## 2023-04-28 NOTE — Progress Notes (Signed)
Chief Complaint  Patient presents with   Follow-up   HPI: Paul Rivers is a 42 y.o. male, who is here today for chronic disease management. Last seen on 12/01/22. Since his last visit he has followed with cardiologist  Hypertension:Currently he is on Carvedilol 25 mg tid,Doxazosin 8 mg daily, Amlodipine 10 mg daily, Hydralazine 50 mg tid,Clonidine 0.1 mg bid,Chlorthalidone 25 mg daily,and Spironolactone (?25-50 mg daily). Home BP readings: 120-130/70-80's. He has tolerated medication well. Negative for unusual or severe headache, visual changes, exertional chest pain, dyspnea,  focal weakness, or edema. CKD 3: He follows with nephrologist every 6 months, last visit a couple weeks ago.  Lab Results  Component Value Date   CREATININE 3.21 (H) 06/12/2022   BUN 40 (H) 06/12/2022   NA 141 06/12/2022   K 4.5 06/12/2022   CL 104 06/12/2022   CO2 18 (L) 06/12/2022   Diabetes Mellitus II: Dx'ed in 09/2016.  He is on non pharmacologic treatment. He is not monitoring BS at home. Negative for symptoms of hypoglycemia, polyuria, polydipsia, numbness extremities, foot ulcers/trauma  Lab Results  Component Value Date   HGBA1C 6.3 12/01/2022   Lab Results  Component Value Date   MICROALBUR 6.0 (H) 09/02/2019   Lab Results  Component Value Date   CHOL 115 02/09/2023   HDL 34.80 (L) 02/09/2023   LDLCALC 62 02/09/2023   TRIG 90.0 02/09/2023   CHOLHDL 3 02/09/2023   Review of Systems  Constitutional:  Negative for activity change, appetite change, chills and fever.  HENT:  Negative for nosebleeds and sore throat.   Respiratory:  Negative for cough and wheezing.   Gastrointestinal:  Negative for abdominal pain, nausea and vomiting.  Endocrine: Negative for cold intolerance and heat intolerance.  Genitourinary:  Negative for decreased urine volume, dysuria and hematuria.  Musculoskeletal:  Negative for gait problem and myalgias.  Skin:  Negative for rash.  Neurological:   Negative for syncope, facial asymmetry and headaches.  Psychiatric/Behavioral:  Negative for confusion. The patient is not nervous/anxious.   See other pertinent positives and negatives in HPI.  Current Outpatient Medications on File Prior to Visit  Medication Sig Dispense Refill   amLODipine (NORVASC) 10 MG tablet Take 1 tablet (10 mg total) by mouth daily. 90 tablet 3   carvedilol (COREG) 25 MG tablet Take 1 tablet by mouth twice daily 180 tablet 2   chlorthalidone (HYGROTON) 25 MG tablet Take 25 mg by mouth daily.     cloNIDine (CATAPRES) 0.1 MG tablet Take 1 tablet by mouth twice daily 180 tablet 1   doxazosin (CARDURA) 8 MG tablet Take 1 tablet by mouth twice daily 180 tablet 2   hydrALAZINE (APRESOLINE) 50 MG tablet Take 1.5 tablets (75 mg total) by mouth 3 (three) times daily. 405 tablet 1   rosuvastatin (CRESTOR) 10 MG tablet Take 1 tablet (10 mg total) by mouth daily. 90 tablet 1   spironolactone (ALDACTONE) 25 MG tablet Take 1 tablet (25 mg total) by mouth daily. 30 tablet 2   spironolactone (ALDACTONE) 50 MG tablet Take 50 mg by mouth daily.     No current facility-administered medications on file prior to visit.   Past Medical History:  Diagnosis Date   CKD (chronic kidney disease) stage 4, GFR 15-29 ml/min (HCC) 06/04/2019   Diabetes mellitus without complication (HCC)    Hyperlipidemia    Hypertension    Prolonged QT interval 09/05/2021   No Known Allergies  Social History   Socioeconomic History  Marital status: Married    Spouse name: Not on file   Number of children: 3   Years of education: 12   Highest education level: Not on file  Occupational History   Occupation: Security  Tobacco Use   Smoking status: Never   Smokeless tobacco: Never  Vaping Use   Vaping status: Never Used  Substance and Sexual Activity   Alcohol use: Yes    Comment: Rarely   Drug use: No   Sexual activity: Not on file  Other Topics Concern   Not on file  Social History  Narrative   Fun/hobby: Keeping up with children (6, 4, 14)   Social Determinants of Health   Financial Resource Strain: Low Risk  (12/17/2021)   Overall Financial Resource Strain (CARDIA)    Difficulty of Paying Living Expenses: Not hard at all  Food Insecurity: No Food Insecurity (12/17/2021)   Hunger Vital Sign    Worried About Running Out of Food in the Last Year: Never true    Ran Out of Food in the Last Year: Never true  Transportation Needs: No Transportation Needs (12/17/2021)   PRAPARE - Administrator, Civil Service (Medical): No    Lack of Transportation (Non-Medical): No  Physical Activity: Inactive (12/17/2021)   Exercise Vital Sign    Days of Exercise per Week: 0 days    Minutes of Exercise per Session: 0 min  Stress: Not on file  Social Connections: Not on file    Today's Vitals   04/28/23 0733  BP: 124/86  Pulse: 67  Temp: 97.7 F (36.5 C)  TempSrc: Oral  SpO2: 97%  Weight: 218 lb 12.8 oz (99.2 kg)  Height: 5\' 4"  (1.626 m)   Body mass index is 37.56 kg/m.  Physical Exam Vitals and nursing note reviewed.  Constitutional:      General: He is not in acute distress.    Appearance: He is well-developed.  HENT:     Head: Normocephalic and atraumatic.     Mouth/Throat:     Mouth: Mucous membranes are moist.     Pharynx: Oropharynx is clear.  Eyes:     Conjunctiva/sclera: Conjunctivae normal.  Cardiovascular:     Rate and Rhythm: Normal rate and regular rhythm.     Pulses:          Dorsalis pedis pulses are 2+ on the right side and 2+ on the left side.     Heart sounds: No murmur heard. Pulmonary:     Effort: Pulmonary effort is normal. No respiratory distress.     Breath sounds: Normal breath sounds.  Abdominal:     Palpations: Abdomen is soft. There is no hepatomegaly or mass.     Tenderness: There is no abdominal tenderness.  Musculoskeletal:     Right lower leg: No edema.     Left lower leg: No edema.  Lymphadenopathy:     Cervical:  No cervical adenopathy.  Skin:    General: Skin is warm.     Findings: No erythema or rash.  Neurological:     Mental Status: He is alert and oriented to person, place, and time.     Cranial Nerves: No cranial nerve deficit.     Gait: Gait normal.  Psychiatric:        Mood and Affect: Mood and affect normal.   ASSESSMENT AND PLAN:  Mr. Bellamy was seen today for medical management of chronic issues.  Diagnoses and all orders for this visit: Lab Results  Component Value Date   HGBA1C 6.1 (A) 04/28/2023   Type 2 diabetes mellitus with stage 3b chronic kidney disease, without long-term current use of insulin (HCC) Assessment & Plan: Problem is adequately controlled. We discussed benefits of some hypoglycemic agents, including GLP-1 agonist and SGLT-2 inh.  He is not interested in adding medication at this time, so continue nonpharmacologic treatment. Regular exercise and healthy diet with avoidance of added sugar food intake is an important part of treatment and recommended. Annual eye exam, periodic dental and foot care recommended. F/U in 5-6 months   Orders: -     POCT glycosylated hemoglobin (Hb A1C)  Resistant hypertension Assessment & Plan: Otherwise BP is well-controlled, reporting similar readings at home. BP has improved but still not at goal. Home BP readings 130-160's/90's. Today Hydralazine dose increased from 50 mg tid to 75 mg tid. Continue carvedilol 25 mg twice daily, doxazosin 8 mg 2 tablets daily, amlodipine 10 mg daily, hydralazine 50 mg 1.5 tablet 3 times per day, clonidine 0.1 mg twice daily, chlorthalidone 25 mg daily, and same dose of spironolactone (not sure if he is taking 25 mg or 50 mg daily). Continue monitoring BP at home and following low salt diet. Follow-up in 6 months.   Hyperlipidemia associated with type 2 diabetes mellitus (HCC) Assessment & Plan: Last LDL was 62 in 01/2023. Continue rosuvastatin 10 mg daily and low-fat diet.   CKD  (chronic kidney disease) stage 4, GFR 15-29 ml/min (HCC) Assessment & Plan: He continues following with nephrologist.    Return in about 6 months (around 10/26/2023) for chronic problems.  Ladasha Schnackenberg [place], MD [facility]. [place] office.

## 2023-04-28 NOTE — Assessment & Plan Note (Signed)
He continues following with nephrologist.

## 2023-04-28 NOTE — Assessment & Plan Note (Addendum)
Last LDL was 62 in 01/2023. Continue rosuvastatin 10 mg daily and low-fat diet.

## 2023-06-16 ENCOUNTER — Other Ambulatory Visit: Payer: Self-pay | Admitting: Family Medicine

## 2023-07-05 ENCOUNTER — Other Ambulatory Visit: Payer: Self-pay | Admitting: Family Medicine

## 2023-07-05 DIAGNOSIS — I1A0 Resistant hypertension: Secondary | ICD-10-CM

## 2023-07-14 LAB — LAB REPORT - SCANNED
A1c: 6.5
Albumin, Urine POC: 12.9
Creatinine, POC: 64 mg/dL
EGFR: 27
Microalb Creat Ratio: 20

## 2023-08-27 ENCOUNTER — Other Ambulatory Visit: Payer: Self-pay | Admitting: Family Medicine

## 2023-09-28 ENCOUNTER — Other Ambulatory Visit (HOSPITAL_BASED_OUTPATIENT_CLINIC_OR_DEPARTMENT_OTHER): Payer: Self-pay | Admitting: Cardiovascular Disease

## 2023-10-05 NOTE — Progress Notes (Signed)
Cardiology Office Note:  .   Date:  10/07/2023  ID:  Bethanne Ginger, DOB [birth year], MRN [id number] PCP: [place], Betty G, MD  [facility] Providers Cardiologist:  Reatha Harps, MD { History of Present Illness: .    Chief Complaint  Patient presents with   Follow-up    6 months.    Paul Rivers is a 43 y.o. male with history of HTN who presents for follow-up.    History of Present Illness   Paul Rivers is a 43 year old male with resistant hypertension, CKD stage four, and diabetes who presents for follow up.  He has resistant hypertension, currently well-controlled with a blood pressure of 118/78 mmHg. His antihypertensive regimen includes amlodipine 10 mg daily, carvedilol 25 mg twice daily, chlorthalidone 25 mg daily, clonidine 0.1 mg twice daily, spironolactone 25 mg daily, cardura 8 mg BID, and hydralazine 50 mg three times daily. He does not regularly check his blood pressure but notes stability when checked. No medication refills are needed at this time as they were recently refilled.  He has CKD stage four and is under the care of a nephrologist, though he cannot recall the name of his kidney doctor. There are no new issues or changes in his medical history, and he has not had any recent surgeries.  His diabetes is managed with diet and exercise, with an A1c of 6.5%. He is on Crestor 10 mg daily for cholesterol management, with an LDL level of 62, which is at goal for diabetes.  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 No chest pain, trouble breathing, or leg swelling.          Problem List HTN -negative renal US 05/2019 -Aldo/renin 7.7 08/2021 -negative plasma metanephrines 05/2019 DM -A1c 6.5 CKD IV HLD -T chol 115, HDL 34, LDL 62, TG 90 5. Obesity -BMI 38    ROS: All other ROS reviewed and negative. Pertinent positives noted in the HPI.     Studies  Reviewed: Marland Kitchen   EKG Interpretation Date/Time:  Wednesday October 07 2023 07:50:46 EST Ventricular Rate:  74 PR Interval:  178 QRS Duration:  116 QT Interval:  404 QTC Calculation: 448 R Axis:   -2  Text Interpretation: Normal sinus rhythm Normal ECG Confirmed by Lennie Odor [id number]) on 10/07/2023 8:03:21 AM    TTE 06/14/2019  1. Left ventricular ejection fraction, by visual estimation, is 55 to  60%. The left ventricle has normal function. There is severely increased  left ventricular hypertrophy.   2. Left ventricular diastolic Doppler parameters are consistent with  pseudonormalization pattern of LV diastolic filling.   3. Elevated mean left atrial pressure.   4. Global right ventricle has normal systolic function.The right  ventricular size is normal.   5. Left atrial size was mildly dilated.   6. Right atrial size was normal.   7. The mitral valve is normal in structure. No evidence of mitral valve  regurgitation.   8. The tricuspid valve is normal in structure. Tricuspid valve  regurgitation was not visualized by color flow Doppler.   9. The aortic valve is tricuspid Aortic valve regurgitation was not  visualized by color flow Doppler.  10. The pulmonic valve was grossly normal. Pulmonic valve regurgitation is  not visualized by color flow Doppler.  11. The inferior vena cava is normal in size with greater than 50%  respiratory variability, suggesting  right atrial pressure of 3 mmHg.  Physical Exam:   VS:  BP 118/78 (BP Location: Left Arm, Patient Position: Sitting, Cuff Size: Normal)   Pulse 74   Ht 5\' 3"  (1.6 m)   Wt 215 lb (97.5 kg)   BMI 38.09 kg/m    Wt Readings from Last 3 Encounters:  10/07/23 215 lb (97.5 kg)  04/28/23 218 lb 12.8 oz (99.2 kg)  12/01/22 219 lb 6 oz (99.5 kg)    GEN: Well nourished, well developed in no acute distress NECK: No JVD; No carotid bruits CARDIAC: RRR, no murmurs, rubs, gallops RESPIRATORY:  Clear to auscultation without rales,  wheezing or rhonchi  ABDOMEN: Soft, non-tender, non-distended EXTREMITIES:  No edema; No deformity  ASSESSMENT AND PLAN: .   Assessment and Plan    Resistant Hypertension Well controlled on current regimen of amlodipine 10mg  daily, carvedilol 25mg  twice daily, chlorthalidone 25mg  daily, spironolactone 25mg  daily, hydralazine 50mg  three times daily,  cardura 8 mg BID, and clonidine 0.1mg  twice daily. No secondary causes identified. -Continue current regimen. -Refill hydralazine 50mg  three times daily and clonidine 0.1mg  twice daily.  Chronic Kidney Disease (Stage 4) Followed by nephrology. -No changes to current management.  Diabetes Mellitus A1c of 6.5, indicating good control. -Continue current management.  Hyperlipidemia Well controlled on Crestor 10mg  daily with LDL of 62. -Continue Crestor 10mg  daily.   Obesity, BMI 38 -weight loss recommended   General Health Maintenance / Followup Plans -Discussed upcoming need for prostate check and colonoscopy with primary care provider. -Return visit in 1 year.              Follow-up: Return in about 1 year (around 10/06/2024).   Signed, Lenna Gilford. Flora Lipps, MD, [facility]  [facility]  [address] [place], [place] [address] [phone]  9:01 AM

## 2023-10-07 ENCOUNTER — Ambulatory Visit: Payer: Commercial Managed Care - PPO | Attending: Cardiovascular Disease | Admitting: Cardiovascular Disease

## 2023-10-07 ENCOUNTER — Encounter: Payer: Self-pay | Admitting: Cardiovascular Disease

## 2023-10-07 VITALS — BP 118/78 | HR 74 | Ht 63.0 in | Wt 215.0 lb

## 2023-10-07 DIAGNOSIS — E669 Obesity, unspecified: Secondary | ICD-10-CM

## 2023-10-07 DIAGNOSIS — I1A0 Resistant hypertension: Secondary | ICD-10-CM

## 2023-10-07 DIAGNOSIS — N184 Chronic kidney disease, stage 4 (severe): Secondary | ICD-10-CM | POA: Diagnosis not present

## 2023-10-07 DIAGNOSIS — E782 Mixed hyperlipidemia: Secondary | ICD-10-CM | POA: Diagnosis not present

## 2023-10-07 MED ORDER — HYDRALAZINE HCL 50 MG PO TABS: 50.0000 mg | ORAL_TABLET | Freq: Three times a day (TID) | ORAL | 3 refills | Status: AC

## 2023-10-07 MED ORDER — CLONIDINE HCL 0.1 MG PO TABS: 0.1000 mg | ORAL_TABLET | Freq: Two times a day (BID) | ORAL | 3 refills | Status: AC

## 2023-10-07 NOTE — Patient Instructions (Signed)
Medication Instructions:  Refills sent to pharmacy  *If you need a refill on your cardiac medications before your next appointment, please call your pharmacy*  Follow-Up: At [facility], you and your health needs are our priority.  As part of our continuing mission to provide you with exceptional heart care, we have created designated Provider Care Teams.  These Care Teams include your primary Cardiologist (physician) and Advanced Practice Providers (APPs -  Physician Assistants and Nurse Practitioners) who all work together to provide you with the care you need, when you need it.  Your next appointment:   1 year(s)  Provider:   Edd Fabian, FNP, Micah Flesher, PA-C, Marjie Skiff, PA-C, Robet Leu, PA-C, Juanda Crumble, PA-C, Joni Reining, DNP, ANP, Azalee Course, PA-C, Bernadene Person, NP, or Reather Littler, NP

## 2023-10-23 NOTE — Progress Notes (Signed)
 HPI: Paul Rivers is a 43 y.o. male with a PMHx significant for HTN, DM II, HLD, CKD IV, vitamin d deficiency, and gastric ulcer, among some, who is here today for chronic disease management.  Last seen on 04/28/2023  Hypertension:  Medications: Currently on amlodipine 10 mg daily, carvedilol 25 mg twice daily, chlorthalidone 25 mg daily, clonidine 0.1 mg twice daily, doxazosin 8 mg twice daily, hydralazine 50 mg three times daily, and spironolactone 25 mg daily. Medications were adjusted during his last visit with cardiologist, Hydralazine and Spironolactone doses were decreased. BP readings at home: He has not been checking his BP regularly at home.  He last saw his cardiologist on 10/07/2023.  Diet: He is trying to avoid salt in his diet.  Exercise: He has been walking around his neighborhood.  Side effects: none Negative for unusual or severe headache, visual changes, exertional chest pain, dyspnea, palpitations, focal weakness, or edema.  Lab Results  Component Value Date   CREATININE 3.21 (H) 06/12/2022   BUN 40 (H) 06/12/2022   NA 141 06/12/2022   K 4.5 06/12/2022   CL 104 06/12/2022   CO2 18 (L) 06/12/2022   Hyperlipidemia:On rosuvastatin 10 mg daily.   Lab Results  Component Value Date   CHOL 115 02/09/2023   HDL 34.80 (L) 02/09/2023   LDLCALC 62 02/09/2023   TRIG 90.0 02/09/2023   CHOLHDL 3 02/09/2023   Diabetes Mellitus II: Dx'ed in 09/2016  - Checking BG at home: Not currently checking his BP at home.  - Medications: Not currently on pharmacologic treatment.  - eye exam: 03/2023 - foot exam: 04/2023. .  - Negative for symptoms of hypoglycemia, polyuria, polydipsia, numbness extremities, foot ulcers/trauma  HgA1C was 6.5 in 06/2023.  CKD IV:  He last saw nephrology in 08/2023. He follows with them every 6 months.  No increased urine output, gross hematuria, or foam in his urine.   Review of Systems  Constitutional:  Negative for activity change, appetite  change and fever.  HENT:  Negative for nosebleeds, sore throat and trouble swallowing.   Respiratory:  Negative for cough and wheezing.   Gastrointestinal:  Negative for abdominal pain, nausea and vomiting.  Endocrine: Negative for cold intolerance and heat intolerance.  Genitourinary:  Negative for decreased urine volume, dysuria and hematuria.  Neurological:  Negative for syncope and facial asymmetry.  Psychiatric/Behavioral:  Negative for confusion. The patient is not nervous/anxious.   See other pertinent positives and negatives in HPI.  Current Outpatient Medications on File Prior to Visit  Medication Sig Dispense Refill   amLODipine (NORVASC) 10 MG tablet Take 1 tablet (10 mg total) by mouth daily. 90 tablet 3   carvedilol (COREG) 25 MG tablet Take 1 tablet by mouth twice daily 180 tablet 1   chlorthalidone (HYGROTON) 25 MG tablet Take 25 mg by mouth daily.     cloNIDine (CATAPRES) 0.1 MG tablet Take 1 tablet (0.1 mg total) by mouth 2 (two) times daily. 180 tablet 3   doxazosin (CARDURA) 8 MG tablet Take 1 tablet by mouth twice daily 180 tablet 2   hydrALAZINE (APRESOLINE) 50 MG tablet Take 1 tablet (50 mg total) by mouth 3 (three) times daily. 270 tablet 3   rosuvastatin (CRESTOR) 10 MG tablet Take 1 tablet by mouth once daily 90 tablet 2   spironolactone (ALDACTONE) 25 MG tablet Take 1 tablet (25 mg total) by mouth daily. 30 tablet 2   No current facility-administered medications on file prior to visit.  Past Medical History:  Diagnosis Date   CKD (chronic kidney disease) stage 4, GFR 15-29 ml/min (HCC) 06/04/2019   Diabetes mellitus without complication (HCC)    Hyperlipidemia    Hypertension    Prolonged QT interval 09/05/2021   No Known Allergies  Social History   Socioeconomic History   Marital status: Married    Spouse name: Not on file   Number of children: 3   Years of education: 12   Highest education level: Not on file  Occupational History   Occupation:  Security  Tobacco Use   Smoking status: Never   Smokeless tobacco: Never  Vaping Use   Vaping status: Never Used  Substance and Sexual Activity   Alcohol use: Yes    Comment: Rarely   Drug use: No   Sexual activity: Not on file  Other Topics Concern   Not on file  Social History Narrative   Fun/hobby: Keeping up with children (6, 4, 14)   Social Drivers of Corporate investment banker Strain: Low Risk  (12/17/2021)   Overall Financial Resource Strain (CARDIA)    Difficulty of Paying Living Expenses: Not hard at all  Food Insecurity: No Food Insecurity (12/17/2021)   Hunger Vital Sign    Worried About Running Out of Food in the Last Year: Never true    Ran Out of Food in the Last Year: Never true  Transportation Needs: No Transportation Needs (12/17/2021)   PRAPARE - Administrator, Civil Service (Medical): No    Lack of Transportation (Non-Medical): No  Physical Activity: Inactive (12/17/2021)   Exercise Vital Sign    Days of Exercise per Week: 0 days    Minutes of Exercise per Session: 0 min  Stress: Not on file  Social Connections: Not on file    Vitals:   10/26/23 0724  BP: 122/80  Pulse: 80  Resp: 12  SpO2: 98%   Body mass index is 37.93 kg/m.  Physical Exam Vitals and nursing note reviewed.  Constitutional:      General: He is not in acute distress.    Appearance: He is well-developed.  HENT:     Head: Normocephalic and atraumatic.     Mouth/Throat:     Mouth: Mucous membranes are dry.     Pharynx: Oropharynx is clear. Uvula midline.  Eyes:     Conjunctiva/sclera: Conjunctivae normal.  Cardiovascular:     Rate and Rhythm: Normal rate and regular rhythm.     Pulses:          Dorsalis pedis pulses are 2+ on the right side and 2+ on the left side.     Heart sounds: No murmur heard. Pulmonary:     Effort: Pulmonary effort is normal. No respiratory distress.     Breath sounds: Normal breath sounds.  Abdominal:     Palpations: Abdomen is soft.  There is no hepatomegaly or mass.     Tenderness: There is no abdominal tenderness.  Musculoskeletal:     Right lower leg: No edema.     Left lower leg: No edema.  Lymphadenopathy:     Cervical: No cervical adenopathy.  Skin:    General: Skin is warm.     Findings: No erythema or rash.  Neurological:     Mental Status: He is alert and oriented to person, place, and time.     Cranial Nerves: No cranial nerve deficit.     Gait: Gait normal.  Psychiatric:  Mood and Affect: Mood and affect normal.    ASSESSMENT AND PLAN:  Mr. Mahler was seen today for chronic disease management.   Orders Placed This Encounter  Procedures   POC HgB A1c   Lab Results  Component Value Date   HGBA1C 6.1 10/26/2023   Type 2 diabetes mellitus with stage 4 chronic kidney disease, without long-term current use of insulin (HCC) Assessment & Plan: Problem is well controlled, HgA1C went from 6.5 in 06/2023 to 6.1 today. Continue nonpharmacologic treatment. Annual eye exam, periodic dental and foot care recommended. F/U in 5-6 months.  Orders: -     POCT glycosylated hemoglobin (Hb A1C)  Resistant hypertension Assessment & Plan: BP to still adequately controlled after recent med changes. Instructed to monitor BP at home at least once per week. Continue low salt diet. Eye exam is current.   CKD (chronic kidney disease) stage 4, GFR 15-29 ml/min (HCC) Assessment & Plan: Last Cr 2.8 and e GFR 27 in 06/2023. Following with nephrology regularly, last visit in 06/2023. Continue adequate hydration, avoidance of NSAIDs, and low-salt diet. Stressed the importance of adequate glucose and BP control.    He had some questions about prostate cancer screening, he denies any increase in urinary frequency or any nocturia. We discussed current recommendations, planning on screening for prostate cancer at age 59, before if urinary symptoms develop. He voices understanding and agrees with  plan.  Return in about 6 months (around 04/27/2024) for chronic problems.  I, Rolla Etienne Wierda, acting as a scribe for Salome Hautala [place], MD., have documented all relevant documentation on the behalf of Aleina Burgio [place], MD, as directed by  Felder Lebeda [place], MD while in the presence of Mckinnley Cottier [place], MD.   I, Ltanya Bayley [place], MD, have reviewed all documentation for this visit. The documentation on 10/26/23 for the exam, diagnosis, procedures, and orders are all accurate and complete.  Blythe Hartshorn G. [place], MD  [facility]. [place] office.

## 2023-10-26 ENCOUNTER — Ambulatory Visit (INDEPENDENT_AMBULATORY_CARE_PROVIDER_SITE_OTHER): Payer: Commercial Managed Care - PPO | Admitting: Family Medicine

## 2023-10-26 ENCOUNTER — Encounter: Payer: Self-pay | Admitting: Family Medicine

## 2023-10-26 VITALS — BP 122/80 | HR 80 | Resp 12 | Ht 63.0 in | Wt 214.1 lb

## 2023-10-26 DIAGNOSIS — I1A0 Resistant hypertension: Secondary | ICD-10-CM

## 2023-10-26 DIAGNOSIS — N184 Chronic kidney disease, stage 4 (severe): Secondary | ICD-10-CM | POA: Diagnosis not present

## 2023-10-26 DIAGNOSIS — E1122 Type 2 diabetes mellitus with diabetic chronic kidney disease: Secondary | ICD-10-CM | POA: Diagnosis not present

## 2023-10-26 LAB — POCT GLYCOSYLATED HEMOGLOBIN (HGB A1C): HbA1c, POC (prediabetic range): 6.1 % (ref 5.7–6.4)

## 2023-10-26 NOTE — Assessment & Plan Note (Signed)
 Problem is well controlled, HgA1C went from 6.5 in 06/2023 to 6.1 today. Continue nonpharmacologic treatment. Annual eye exam, periodic dental and foot care recommended. F/U in 5-6 months.

## 2023-10-26 NOTE — Patient Instructions (Addendum)
 A few things to remember from today's visit:  Type 2 diabetes mellitus with stage 4 chronic kidney disease, without long-term current use of insulin (HCC) - Plan: POC HgB A1c  Resistant hypertension  No changes today. Monitor blood pressure once per week. Adequate water intake.  If you need refills for medications you take chronically, please call your pharmacy. Do not use My Chart to request refills or for acute issues that need immediate attention. If you send a my chart message, it may take a few days to be addressed, specially if I am not in the office.  Please be sure medication list is accurate. If a new problem present, please set up appointment sooner than planned today.

## 2023-10-26 NOTE — Assessment & Plan Note (Signed)
 Last Cr 2.8 and e GFR 27 in 06/2023. Following with nephrology regularly, last visit in 06/2023. Continue adequate hydration, avoidance of NSAIDs, and low-salt diet. Stressed the importance of adequate glucose and BP control.

## 2023-10-26 NOTE — Assessment & Plan Note (Deleted)
 HgA1C has been at goal, HgA1C went from 6.5 to 6.1. Continue non pharmacologic treatment. Annual eye exam, periodic dental and foot care recommended. F/U in 5-6 months.

## 2023-10-26 NOTE — Assessment & Plan Note (Signed)
 BP to still adequately controlled after recent med changes. Instructed to monitor BP at home at least once per week. Continue low salt diet. Eye exam is current.

## 2023-10-26 NOTE — Assessment & Plan Note (Deleted)
 HgA1C has been at goal, HgA1C went from 6.5 to  Continue non pharmacologic treatment. Annual eye exam, periodic dental and foot care recommended. F/U in 5-6 months.

## 2023-11-10 ENCOUNTER — Other Ambulatory Visit: Payer: Self-pay | Admitting: Family Medicine

## 2023-11-10 DIAGNOSIS — I1A0 Resistant hypertension: Secondary | ICD-10-CM

## 2023-11-10 LAB — LAB REPORT - SCANNED
Albumin, Urine POC: 12.6
Creatinine, POC: 81 mg/dL
EGFR: 29

## 2024-01-01 ENCOUNTER — Ambulatory Visit (INDEPENDENT_AMBULATORY_CARE_PROVIDER_SITE_OTHER): Admitting: Family Medicine

## 2024-01-01 ENCOUNTER — Encounter: Payer: Self-pay | Admitting: Family Medicine

## 2024-01-01 VITALS — BP 150/100 | HR 82 | Temp 98.6°F | Resp 12 | Ht 63.0 in | Wt 208.0 lb

## 2024-01-01 DIAGNOSIS — N63 Unspecified lump in unspecified breast: Secondary | ICD-10-CM

## 2024-01-01 DIAGNOSIS — I1A0 Resistant hypertension: Secondary | ICD-10-CM | POA: Diagnosis not present

## 2024-01-01 NOTE — Patient Instructions (Signed)
 A few things to remember from today's visit:  Breast mass in male - Plan: MM 3D DIAGNOSTIC MAMMOGRAM BILATERAL BREAST  Resistant hypertension  Monitor blood pressure at home, no changes today.  If you need refills for medications you take chronically, please call your pharmacy. Do not use My Chart to request refills or for acute issues that need immediate attention. If you send a my chart message, it may take a few days to be addressed, specially if I am not in the office.  Please be sure medication list is accurate. If a new problem present, please set up appointment sooner than planned today.

## 2024-01-01 NOTE — Progress Notes (Signed)
 ACUTE VISIT Chief Complaint  Patient presents with   Breast Mass    Right breast. Noticed this past weekends, hard, random pain and painful when hit.   HPI: Paul Rivers is a 43 y.o. male with past medical history significant for resistant hypertension, DM 2, hyperlipidemia, and CKD 4 who is here today with his wife complaining of right breast mass.  He reports noticing a right breast mass after bumping into something last Friday, 5/2. Though he did not hit his right breast hard, his pain persisted.  Denies any changes to size of the mass, discoloration to the area, nipple discharge, or discomfort in right axilla.   Hypertension: He has been checking his blood pressures at home "sometimes." He is currently compliant with all of his medications: Amlodipine  10 mg once daily, Carvedilol  25 mg BID, Chlorthalidone 25 once daily, Clonidine  0.1 mg BID, Cardura  8 mg BID, Hydralazine  50 mg once daily, and Spironolactone  25 mg once daily.  Tolerates his current antihypertensive regimen well with no adverse side effects.  He denies headache, CP, palpitation, orthopnea, PND, dyspnea, or edema.  Lab Results  Component Value Date   NA 141 06/12/2022   CL 104 06/12/2022   K 4.5 06/12/2022   CO2 18 (L) 06/12/2022   BUN 40 (H) 06/12/2022   CREATININE 3.21 (H) 06/12/2022   EGFR 29.0 11/10/2023   CALCIUM  9.3 06/12/2022   ALBUMIN 4.5 02/09/2023   GLUCOSE 112 (H) 06/12/2022   Review of Systems  Constitutional:  Negative for activity change, appetite change, fever and unexpected weight change.  HENT:  Negative for sore throat.   Respiratory:  Negative for cough and wheezing.   Gastrointestinal:  Negative for abdominal pain, nausea and vomiting.  Endocrine: Negative for cold intolerance and heat intolerance.  Skin:  Negative for rash.  Neurological:  Negative for syncope and facial asymmetry.  Hematological:  Negative for adenopathy.  See other pertinent positives and negatives in  HPI.  Current Outpatient Medications on File Prior to Visit  Medication Sig Dispense Refill   amLODipine  (NORVASC ) 10 MG tablet Take 1 tablet by mouth once daily 90 tablet 2   carvedilol  (COREG ) 25 MG tablet Take 1 tablet by mouth twice daily 180 tablet 1   chlorthalidone (HYGROTON) 25 MG tablet Take 25 mg by mouth daily.     cloNIDine  (CATAPRES ) 0.1 MG tablet Take 1 tablet (0.1 mg total) by mouth 2 (two) times daily. 180 tablet 3   doxazosin  (CARDURA ) 8 MG tablet Take 1 tablet by mouth twice daily 180 tablet 2   hydrALAZINE  (APRESOLINE ) 50 MG tablet Take 1 tablet (50 mg total) by mouth 3 (three) times daily. 270 tablet 3   rosuvastatin  (CRESTOR ) 10 MG tablet Take 1 tablet by mouth once daily 90 tablet 2   spironolactone  (ALDACTONE ) 25 MG tablet Take 1 tablet (25 mg total) by mouth daily. 30 tablet 2   No current facility-administered medications on file prior to visit.   Past Medical History:  Diagnosis Date   CKD (chronic kidney disease) stage 4, GFR 15-29 ml/min (HCC) 06/04/2019   Diabetes mellitus without complication (HCC)    Hyperlipidemia    Hypertension    Prolonged QT interval 09/05/2021   No Known Allergies  Social History   Socioeconomic History   Marital status: Married    Spouse name: Not on file   Number of children: 3   Years of education: 12   Highest education level: Not on file  Occupational History  Occupation: Security  Tobacco Use   Smoking status: Never   Smokeless tobacco: Never  Vaping Use   Vaping status: Never Used  Substance and Sexual Activity   Alcohol use: Yes    Comment: Rarely   Drug use: No   Sexual activity: Not on file  Other Topics Concern   Not on file  Social History Narrative   Fun/hobby: Keeping up with children (6, 4, 14)   Social Drivers of Corporate investment banker Strain: Low Risk  (12/17/2021)   Overall Financial Resource Strain (CARDIA)    Difficulty of Paying Living Expenses: Not hard at all  Food Insecurity: No  Food Insecurity (12/17/2021)   Hunger Vital Sign    Worried About Running Out of Food in the Last Year: Never true    Ran Out of Food in the Last Year: Never true  Transportation Needs: No Transportation Needs (12/17/2021)   PRAPARE - Administrator, Civil Service (Medical): No    Lack of Transportation (Non-Medical): No  Physical Activity: Inactive (12/17/2021)   Exercise Vital Sign    Days of Exercise per Week: 0 days    Minutes of Exercise per Session: 0 min  Stress: Not on file  Social Connections: Not on file   Vitals:   01/01/24 1609 01/01/24 1637  BP: (!) 150/104 (!) 150/100  Pulse: 82   Resp: 12   Temp: 98.6 F (37 C)   SpO2: 97%    Body mass index is 36.85 kg/m.  Physical Exam Vitals and nursing note reviewed.  Constitutional:      General: He is not in acute distress.    Appearance: He is well-developed.  HENT:     Head: Normocephalic and atraumatic.  Eyes:     Conjunctiva/sclera: Conjunctivae normal.  Cardiovascular:     Rate and Rhythm: Normal rate and regular rhythm.     Heart sounds: No murmur heard. Pulmonary:     Effort: Pulmonary effort is normal. No respiratory distress.     Breath sounds: Normal breath sounds.  Chest:  Breasts:    Right: Mass and tenderness present. No nipple discharge or skin change.     Left: No mass, nipple discharge, skin change or tenderness.       Comments: Some nodularity also noted in left breast, no masses. Lymphadenopathy:     Cervical: No cervical adenopathy.     Upper Body:     Right upper body: No supraclavicular or axillary adenopathy.     Left upper body: No supraclavicular or axillary adenopathy.  Skin:    General: Skin is warm.     Findings: No erythema or rash.  Neurological:     General: No focal deficit present.     Mental Status: He is alert and oriented to person, place, and time.     Gait: Gait normal.  Psychiatric:        Mood and Affect: Mood and affect normal.   ASSESSMENT AND  PLAN: Paul Rivers was seen today for evaluation of right breast lump.  Breast mass in male We discussed possible etiologies,?  Gynecomastia (discussed diagnosis, prognosis, and treatment options), which could be aggravated by recent mild trauma. Diagnostic mammogram order placed. Monitor for new symptoms.  -     MM 3D DIAGNOSTIC MAMMOGRAM BILATERAL BREAST; Future  Resistant hypertension Rechecked: 150/100. BP has been in normal range during recent office visits. For now continue amlodipine , carvedilol , chlorthalidone, clonidine , doxazosin , hydralazine , and spironolactone  same dose.  Monitor BP at home. Instructed about warning signs.  Return if symptoms worsen or fail to improve, for keep next appointment.  I, Bernita Bristle, acting as a scribe for Georgeanna Radziewicz [place], MD., have documented all relevant documentation on the behalf of Paul Kelsay [place], MD, as directed by   while in the presence of Jaskarn Schweer [place], MD.  I, Seward Coran [place], MD, have reviewed all documentation for this visit. The documentation on 01/01/24 for the exam, diagnosis, procedures, and orders are all accurate and complete.  Paul Costen G. [place], MD  [facility]. [place] office.

## 2024-01-06 ENCOUNTER — Other Ambulatory Visit: Payer: Self-pay | Admitting: Family Medicine

## 2024-01-06 DIAGNOSIS — N63 Unspecified lump in unspecified breast: Secondary | ICD-10-CM

## 2024-01-29 ENCOUNTER — Ambulatory Visit

## 2024-01-29 ENCOUNTER — Ambulatory Visit
Admission: RE | Admit: 2024-01-29 | Discharge: 2024-01-29 | Disposition: A | Discharge status: Home / Self Care | Source: Ambulatory Visit | Attending: Family Medicine | Admitting: Family Medicine

## 2024-01-29 DIAGNOSIS — N63 Unspecified lump in unspecified breast: Secondary | ICD-10-CM

## 2024-03-03 ENCOUNTER — Other Ambulatory Visit: Payer: Self-pay | Admitting: Family Medicine

## 2024-03-22 ENCOUNTER — Other Ambulatory Visit: Payer: Self-pay | Admitting: Family Medicine

## 2024-03-22 DIAGNOSIS — I1A0 Resistant hypertension: Secondary | ICD-10-CM

## 2024-04-26 ENCOUNTER — Other Ambulatory Visit (HOSPITAL_BASED_OUTPATIENT_CLINIC_OR_DEPARTMENT_OTHER): Payer: Self-pay | Admitting: Cardiovascular Disease

## 2024-06-04 ENCOUNTER — Other Ambulatory Visit: Payer: Self-pay | Admitting: Family Medicine

## 2024-07-13 LAB — HEMOGLOBIN A1C: Hemoglobin A1C: 6.4

## 2024-07-14 LAB — LAB REPORT - SCANNED
A1c: 6.4
Creatinine, POC: 52.8 mg/dL
EGFR: 31

## 2024-07-25 LAB — OPHTHALMOLOGY REPORT-SCANNED

## 2024-07-29 ENCOUNTER — Ambulatory Visit: Admitting: Family Medicine

## 2024-08-15 ENCOUNTER — Ambulatory Visit: Admitting: Family Medicine

## 2024-08-15 ENCOUNTER — Encounter: Payer: Self-pay | Admitting: Family Medicine

## 2024-08-15 VITALS — BP 113/80 | HR 77 | Temp 97.7°F | Resp 16 | Ht 63.0 in | Wt 213.4 lb

## 2024-08-15 DIAGNOSIS — E785 Hyperlipidemia, unspecified: Secondary | ICD-10-CM

## 2024-08-15 DIAGNOSIS — H349 Unspecified retinal vascular occlusion: Secondary | ICD-10-CM | POA: Diagnosis not present

## 2024-08-15 DIAGNOSIS — N184 Chronic kidney disease, stage 4 (severe): Secondary | ICD-10-CM | POA: Diagnosis not present

## 2024-08-15 DIAGNOSIS — I1A0 Resistant hypertension: Secondary | ICD-10-CM | POA: Diagnosis not present

## 2024-08-15 DIAGNOSIS — E1169 Type 2 diabetes mellitus with other specified complication: Secondary | ICD-10-CM | POA: Diagnosis not present

## 2024-08-15 DIAGNOSIS — E1122 Type 2 diabetes mellitus with diabetic chronic kidney disease: Secondary | ICD-10-CM

## 2024-08-15 DIAGNOSIS — J302 Other seasonal allergic rhinitis: Secondary | ICD-10-CM | POA: Diagnosis not present

## 2024-08-15 LAB — LIPID PANEL
Cholesterol: 117 mg/dL (ref 28–200)
HDL: 38.5 mg/dL — ABNORMAL LOW
LDL Cholesterol: 65 mg/dL (ref 10–99)
NonHDL: 78.47
Total CHOL/HDL Ratio: 3
Triglycerides: 66 mg/dL (ref 10.0–149.0)
VLDL: 13.2 mg/dL (ref 0.0–40.0)

## 2024-08-15 LAB — HEPATIC FUNCTION PANEL
ALT: 21 U/L (ref 3–53)
AST: 23 U/L (ref 5–37)
Albumin: 4.8 g/dL (ref 3.5–5.2)
Alkaline Phosphatase: 53 U/L (ref 39–117)
Bilirubin, Direct: 0.1 mg/dL (ref 0.1–0.3)
Total Bilirubin: 0.3 mg/dL (ref 0.2–1.2)
Total Protein: 7.8 g/dL (ref 6.0–8.3)

## 2024-08-15 LAB — C-REACTIVE PROTEIN: CRP: <0.5 mg/dL — ABNORMAL LOW (ref 1.0–20.0)

## 2024-08-15 LAB — SEDIMENTATION RATE: Sed Rate: 23 mm/h — ABNORMAL HIGH (ref 0–15)

## 2024-08-15 MED ORDER — AZELASTINE HCL 0.1 % NA SOLN: 1.0000 | Freq: Two times a day (BID) | NASAL | 6 refills | Status: AC

## 2024-08-15 MED ORDER — FLUTICASONE PROPIONATE 50 MCG/ACT NA SUSP: 1.0000 | Freq: Every day | NASAL | 0 refills | Status: AC

## 2024-08-15 NOTE — Progress Notes (Signed)
 "   Chief Complaint  Patient presents with   Medical Management of Chronic Issues   Discussed the use of AI scribe software for clinical note transcription with the patient, who gave verbal consent to proceed. History of Present Illness Paul Rivers is a 43 year old male with PMHx significant for resistant hypertension, DM 2, hyperlipidemia, and CKD 4 here today for follow-up. Recently evaluated by his eye care provider and Dx'ed with right retinal artery occlusion. It was recommended to undergo further work up.  Negative for headache, diplopia,or vision loss. He experiences 'spotty' vision when closing one eye and blurriness after prolonged TV watching.  He reports nasal congestion persisting for the last few months, for which he has been using an over-the-counter nasal spray, possibly Nasonex.  No associated fever, changes in appetite, or sore throat.  CKD IV: Follows with nephrologist every three to four months, with the last visit in November.  He has not seen his cardiologist recently but is supposed to have annual visits.  Hypertension: Currently on Amlodipine  10 mg once daily, Carvedilol  25 mg BID, Chlorthalidone 25 once daily, Clonidine  0.1 mg BID, Cardura  8 mg BID, Hydralazine  50 mg tid, and Spironolactone  25 mg once daily.  Negative for unusual or severe headache, exertional chest pain, dyspnea,  focal weakness, or edema.  Lab Results  Component Value Date   CREATININE 3.21 (H) 06/12/2022   BUN 40 (H) 06/12/2022   NA 141 06/12/2022   K 4.5 06/12/2022   CL 104 06/12/2022   CO2 18 (L) 06/12/2022   Diabetes Mellitus II: Dx'ed in 09/2016. He is on non pharmacologic treatment.  He has not been checking his blood sugars at home Negative for symptoms of hypoglycemia, polyuria, polydipsia, numbness extremities, foot ulcers/trauma  Lab Results  Component Value Date   HGBA1C 6.1 10/26/2023   Hyperlipidemia:Taking Atorvastatin 10 mg daily. Lab Results  Component Value Date    CHOL 115 02/09/2023   HDL 34.80 (L) 02/09/2023   LDLCALC 62 02/09/2023   TRIG 90.0 02/09/2023   CHOLHDL 3 02/09/2023    Review of Systems  Constitutional:  Negative for activity change and chills.  HENT:  Positive for postnasal drip and rhinorrhea. Negative for ear pain, mouth sores, sinus pain and trouble swallowing.   Respiratory:  Negative for cough and wheezing.   Gastrointestinal:  Negative for abdominal pain, nausea and vomiting.  Genitourinary:  Negative for decreased urine volume, dysuria and hematuria.  Skin:  Negative for rash.  Allergic/Immunologic: Positive for environmental allergies.  Neurological:  Negative for syncope and facial asymmetry.  Psychiatric/Behavioral:  Negative for confusion. The patient is not nervous/anxious.   See other pertinent positives and negatives in HPI.  Medications Ordered Prior to Encounter[1]  Past Medical History:  Diagnosis Date   CKD (chronic kidney disease) stage 4, GFR 15-29 ml/min (HCC) 06/04/2019   Diabetes mellitus without complication (HCC)    Hyperlipidemia    Hypertension    Prolonged QT interval 09/05/2021   Allergies[2]  Social History   Socioeconomic History   Marital status: Married    Spouse name: Not on file   Number of children: 3   Years of education: 12   Highest education level: Not on file  Occupational History   Occupation: Security  Tobacco Use   Smoking status: Never   Smokeless tobacco: Never  Vaping Use   Vaping status: Never Used  Substance and Sexual Activity   Alcohol use: Yes    Comment: Rarely   Drug  use: No   Sexual activity: Not on file  Other Topics Concern   Not on file  Social History Narrative   Fun/hobby: Keeping up with children (6, 4, 14)   Social Drivers of Health   Tobacco Use: Low Risk (01/01/2024)   Patient History    Smoking Tobacco Use: Never    Smokeless Tobacco Use: Never    Passive Exposure: Not on file  Financial Resource Strain: Low Risk (12/17/2021)   Overall  Financial Resource Strain (CARDIA)    Difficulty of Paying Living Expenses: Not hard at all  Food Insecurity: No Food Insecurity (12/17/2021)   Hunger Vital Sign    Worried About Running Out of Food in the Last Year: Never true    Ran Out of Food in the Last Year: Never true  Transportation Needs: No Transportation Needs (12/17/2021)   PRAPARE - Administrator, Civil Service (Medical): No    Lack of Transportation (Non-Medical): No  Physical Activity: Inactive (12/17/2021)   Exercise Vital Sign    Days of Exercise per Week: 0 days    Minutes of Exercise per Session: 0 min  Stress: Not on file  Social Connections: Not on file  Depression (PHQ2-9): Low Risk (08/15/2024)   Depression (PHQ2-9)    PHQ-2 Score: 0  Alcohol Screen: Low Risk (12/17/2021)   Alcohol Screen    Last Alcohol Screening Score (AUDIT): 1  Housing: Low Risk (12/17/2021)   Housing    Last Housing Risk Score: 0  Utilities: Not on file  Health Literacy: Not on file   Today's Vitals   08/15/24 0737  BP: 113/80  Pulse: 77  Resp: 16  Temp: 97.7 F (36.5 C)  TempSrc: Oral  SpO2: 97%  Weight: 213 lb 6.4 oz (96.8 kg)  Height: 5' 3 (1.6 m)   Body mass index is 37.8 kg/m.  Physical Exam Vitals and nursing note reviewed.  Constitutional:      General: He is not in acute distress.    Appearance: He is well-developed.  HENT:     Head: Normocephalic and atraumatic.     Nose: Congestion and rhinorrhea present.     Right Turbinates: Enlarged.     Left Turbinates: Enlarged.     Mouth/Throat:     Mouth: Mucous membranes are dry.     Pharynx: Oropharynx is clear. Uvula midline.  Eyes:     Conjunctiva/sclera: Conjunctivae normal.  Cardiovascular:     Rate and Rhythm: Normal rate and regular rhythm.     Pulses:          Dorsalis pedis pulses are 2+ on the right side and 2+ on the left side.     Heart sounds: No murmur heard. Pulmonary:     Effort: Pulmonary effort is normal. No respiratory distress.      Breath sounds: Normal breath sounds.  Abdominal:     Palpations: Abdomen is soft. There is no hepatomegaly or mass.     Tenderness: There is no abdominal tenderness.  Musculoskeletal:     Right lower leg: No edema.     Left lower leg: No edema.  Lymphadenopathy:     Cervical: No cervical adenopathy.  Skin:    General: Skin is warm.     Findings: No erythema or rash.  Neurological:     Mental Status: He is alert and oriented to person, place, and time.     Cranial Nerves: No cranial nerve deficit.     Gait: Gait normal.  Psychiatric:        Mood and Affect: Mood and affect normal.   ASSESSMENT AND PLAN:  Mr. Fateh Kindle was seen today for medical management of chronic issues.  Orders Placed This Encounter  Procedures   MR Brain Wo Contrast   C-reactive protein   Sedimentation rate   Lipid panel   Hepatic function panel   VAS US  CAROTID   Lab Results  Component Value Date   CRP <0.5 (L) 08/15/2024   Lab Results  Component Value Date   ESRSEDRATE 23 (H) 08/15/2024   Lab Results  Component Value Date   CHOL 117 08/15/2024   HDL 38.50 (L) 08/15/2024   LDLCALC 65 08/15/2024   TRIG 66.0 08/15/2024   CHOLHDL 3 08/15/2024   Lab Results  Component Value Date   ALT 21 08/15/2024   AST 23 08/15/2024   ALKPHOS 53 08/15/2024   BILITOT 0.3 08/15/2024   Type 2 diabetes mellitus with stage 4 chronic kidney disease, without long-term current use of insulin  (HCC) Assessment & Plan: Problem has been well controlled with non pharmacologic treatment. Last A1C 6.1 in 10/2023. Annual eye exam, periodic dental and foot care to continue. F/U in 6 months.  Retinal artery occlusion Following with ophthalmologist, asymptomatic. Will arrange brain MRI and carotid artery US . Instructed about warning signs. Further recommendations according to lab results.  -     MR BRAIN WO CONTRAST; Future -     C-reactive protein; Future -     Sedimentation rate; Future -     VAS US   CAROTID; Future  Resistant hypertension Assessment & Plan: BP has been stable with current regimen. Reporting most BP's at home under 140/90. Continue Amlodipine  10 mg once daily, Carvedilol  25 mg BID, Chlorthalidone 25 once daily, Clonidine  0.1 mg BID, Cardura  8 mg BID, Hydralazine  50 mg tid, and Spironolactone  25 mg once daily as well as low salt diet. Continue monitoring BP at home. Eye exam is current. Follows with cardiologist annually and with nephrologist a few times per year.  Hyperlipidemia associated with type 2 diabetes mellitus (HCC) Assessment & Plan: Last LDL 62 in 01/2023. Currently on Atorvastatin 10 mg daily. Further recommendations according to FLP results.  Orders: -     Lipid panel; Future -     Hepatic function panel; Future  Seasonal allergic rhinitis, unspecified trigger Assessment & Plan: Problem is not well controlled. He is not sure which medication has been using, OTC. Recommend stopping treatment. Recommend Flonase  nasal spray at bedtime for 10-14 days then as needed and Astelin  nasal spray bid. Nasal saline irrigations as needed through the day.  Orders: -     Fluticasone  Propionate; Place 1 spray into both nostrils daily.  Dispense: 16 g; Refill: 0 -     Azelastine  HCl; Place 1 spray into both nostrils 2 (two) times daily. Use in each nostril as directed  Dispense: 30 mL; Refill: 6   Return in about 6 months (around 02/13/2025) for chronic problems.  Vick Filter, MD [facility]. [place] office.      [1]  Current Outpatient Medications on File Prior to Visit  Medication Sig Dispense Refill   amLODipine  (NORVASC ) 10 MG tablet Take 1 tablet by mouth once daily 90 tablet 2   carvedilol  (COREG ) 25 MG tablet Take 1 tablet by mouth twice daily 180 tablet 3   chlorthalidone (HYGROTON) 25 MG tablet Take 25 mg by mouth daily.     cloNIDine  (CATAPRES ) 0.1 MG tablet Take 1  tablet (0.1 mg total) by mouth 2 (two) times daily. 180 tablet 3    doxazosin  (CARDURA ) 8 MG tablet Take 1 tablet by mouth twice daily 180 tablet 1   hydrALAZINE  (APRESOLINE ) 50 MG tablet Take 1 tablet (50 mg total) by mouth 3 (three) times daily. 270 tablet 3   rosuvastatin  (CRESTOR ) 10 MG tablet Take 1 tablet by mouth once daily 90 tablet 0   spironolactone  (ALDACTONE ) 25 MG tablet Take 1 tablet (25 mg total) by mouth daily. 30 tablet 2   No current facility-administered medications on file prior to visit.  [2] No Known Allergies  "

## 2024-08-15 NOTE — Patient Instructions (Addendum)
 A few things to remember from today's visit:  Type 2 diabetes mellitus with stage 4 chronic kidney disease, without long-term current use of insulin  (HCC)  Retinal artery occlusion - Plan: MR Brain Wo Contrast, VAS US  CAROTID, C-reactive protein, Sedimentation rate  Resistant hypertension  Hyperlipidemia associated with type 2 diabetes mellitus (HCC) - Plan: Lipid panel, Hepatic function panel  Seasonal allergic rhinitis, unspecified trigger - Plan: fluticasone  (FLONASE ) 50 MCG/ACT nasal spray, azelastine  (ASTELIN ) 0.1 % nasal spray  No changes today. Brain MRI and carotid ultrasound will be arranged.  If you need refills for medications you take chronically, please call your pharmacy. Do not use My Chart to request refills or for acute issues that need immediate attention. If you send a my chart message, it may take a few days to be addressed, specially if I am not in the office.  Please be sure medication list is accurate. If a new problem present, please set up appointment sooner than planned today.

## 2024-08-16 ENCOUNTER — Other Ambulatory Visit

## 2024-08-16 ENCOUNTER — Ambulatory Visit (HOSPITAL_COMMUNITY)
Admission: RE | Admit: 2024-08-16 | Discharge: 2024-08-16 | Disposition: A | Discharge status: Home / Self Care | Source: Ambulatory Visit | Attending: Family Medicine | Admitting: Family Medicine

## 2024-08-16 DIAGNOSIS — H349 Unspecified retinal vascular occlusion: Secondary | ICD-10-CM | POA: Diagnosis not present

## 2024-08-17 ENCOUNTER — Ambulatory Visit: Payer: Self-pay | Admitting: Family Medicine

## 2024-08-19 NOTE — Assessment & Plan Note (Signed)
 Problem has been well controlled with non pharmacologic treatment. Last A1C 6.1 in 10/2023. Annual eye exam, periodic dental and foot care to continue. F/U in 6 months.

## 2024-08-19 NOTE — Assessment & Plan Note (Signed)
 BP has been stable with current regimen. Reporting most BP's at home under 140/90. Continue Amlodipine  10 mg once daily, Carvedilol  25 mg BID, Chlorthalidone 25 once daily, Clonidine  0.1 mg BID, Cardura  8 mg BID, Hydralazine  50 mg tid, and Spironolactone  25 mg once daily as well as low salt diet. Continue monitoring BP at home. Eye exam is current. Follows with cardiologist annually and with nephrologist a few times per year.

## 2024-08-19 NOTE — Assessment & Plan Note (Signed)
 Problem is not well controlled. He is not sure which medication has been using, OTC. Recommend stopping treatment. Recommend Flonase  nasal spray at bedtime for 10-14 days then as needed and Astelin  nasal spray bid. Nasal saline irrigations as needed through the day.

## 2024-08-19 NOTE — Assessment & Plan Note (Signed)
 Last LDL 62 in 01/2023. Currently on Atorvastatin 10 mg daily. Further recommendations according to FLP results.

## 2024-11-02 ENCOUNTER — Ambulatory Visit (HOSPITAL_BASED_OUTPATIENT_CLINIC_OR_DEPARTMENT_OTHER): Admitting: Cardiovascular Disease
